# Patient Record
Sex: Female | Born: 1940 | Race: White | Hispanic: No | Marital: Married | Smoking: Never smoker
Health system: Southern US, Community
[De-identification: ages and names within clinical notes are randomized; demographics above are authoritative.]

## PROBLEM LIST (undated history)

## (undated) DIAGNOSIS — K219 Gastro-esophageal reflux disease without esophagitis: Secondary | ICD-10-CM

## (undated) DIAGNOSIS — A809 Acute poliomyelitis, unspecified: Secondary | ICD-10-CM

## (undated) DIAGNOSIS — E119 Type 2 diabetes mellitus without complications: Secondary | ICD-10-CM

## (undated) DIAGNOSIS — Z9889 Other specified postprocedural states: Secondary | ICD-10-CM

## (undated) DIAGNOSIS — F329 Major depressive disorder, single episode, unspecified: Secondary | ICD-10-CM

## (undated) DIAGNOSIS — C50919 Malignant neoplasm of unspecified site of unspecified female breast: Secondary | ICD-10-CM

## (undated) DIAGNOSIS — R112 Nausea with vomiting, unspecified: Secondary | ICD-10-CM

## (undated) DIAGNOSIS — Z87898 Personal history of other specified conditions: Secondary | ICD-10-CM

## (undated) DIAGNOSIS — E039 Hypothyroidism, unspecified: Secondary | ICD-10-CM

## (undated) DIAGNOSIS — F32A Depression, unspecified: Secondary | ICD-10-CM

## (undated) DIAGNOSIS — E7800 Pure hypercholesterolemia, unspecified: Secondary | ICD-10-CM

## (undated) DIAGNOSIS — I10 Essential (primary) hypertension: Secondary | ICD-10-CM

## (undated) HISTORY — DX: Major depressive disorder, single episode, unspecified: F32.9

## (undated) HISTORY — DX: Gastro-esophageal reflux disease without esophagitis: K21.9

## (undated) HISTORY — DX: Pure hypercholesterolemia, unspecified: E78.00

## (undated) HISTORY — DX: Hypothyroidism, unspecified: E03.9

## (undated) HISTORY — DX: Personal history of other specified conditions: Z87.898

## (undated) HISTORY — DX: Depression, unspecified: F32.A

## (undated) HISTORY — DX: Essential (primary) hypertension: I10

## (undated) HISTORY — PX: TUBAL LIGATION: SHX77

## (undated) HISTORY — DX: Acute poliomyelitis, unspecified: A80.9

## (undated) HISTORY — PX: EYE SURGERY: SHX253

## (undated) HISTORY — DX: Type 2 diabetes mellitus without complications: E11.9

## (undated) HISTORY — DX: Malignant neoplasm of unspecified site of unspecified female breast: C50.919

---
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
## 1999-09-04 ENCOUNTER — Ambulatory Visit (HOSPITAL_COMMUNITY): Admission: RE | Admit: 1999-09-04 | Discharge: 1999-09-04 | Payer: Self-pay | Admitting: Cardiology

## 2003-01-06 DIAGNOSIS — C50919 Malignant neoplasm of unspecified site of unspecified female breast: Secondary | ICD-10-CM

## 2003-01-06 HISTORY — DX: Malignant neoplasm of unspecified site of unspecified female breast: C50.919

## 2003-01-06 HISTORY — PX: BREAST BIOPSY: SHX20

## 2003-01-06 HISTORY — PX: BREAST LUMPECTOMY: SHX2

## 2003-05-25 ENCOUNTER — Other Ambulatory Visit: Payer: Self-pay

## 2003-09-04 ENCOUNTER — Other Ambulatory Visit: Payer: Self-pay

## 2003-09-05 ENCOUNTER — Other Ambulatory Visit: Payer: Self-pay

## 2003-10-06 ENCOUNTER — Encounter: Payer: Self-pay | Admitting: Oncology

## 2003-10-06 ENCOUNTER — Ambulatory Visit: Payer: Self-pay | Admitting: Radiation Oncology

## 2003-11-06 ENCOUNTER — Ambulatory Visit: Payer: Self-pay | Admitting: Radiation Oncology

## 2003-11-06 ENCOUNTER — Encounter: Payer: Self-pay | Admitting: Oncology

## 2003-12-06 ENCOUNTER — Ambulatory Visit: Payer: Self-pay | Admitting: Radiation Oncology

## 2003-12-06 ENCOUNTER — Encounter: Payer: Self-pay | Admitting: Oncology

## 2004-01-06 ENCOUNTER — Ambulatory Visit: Payer: Self-pay | Admitting: Radiation Oncology

## 2004-01-06 ENCOUNTER — Encounter: Payer: Self-pay | Admitting: Oncology

## 2004-02-06 ENCOUNTER — Ambulatory Visit: Payer: Self-pay | Admitting: Radiation Oncology

## 2004-02-06 ENCOUNTER — Encounter: Payer: Self-pay | Admitting: Oncology

## 2004-03-05 ENCOUNTER — Encounter: Payer: Self-pay | Admitting: Oncology

## 2004-03-05 ENCOUNTER — Ambulatory Visit: Payer: Self-pay | Admitting: Radiation Oncology

## 2004-04-05 ENCOUNTER — Encounter: Payer: Self-pay | Admitting: Oncology

## 2004-04-05 ENCOUNTER — Ambulatory Visit: Payer: Self-pay | Admitting: Radiation Oncology

## 2004-05-05 ENCOUNTER — Ambulatory Visit: Payer: Self-pay | Admitting: Radiation Oncology

## 2004-06-05 ENCOUNTER — Ambulatory Visit: Payer: Self-pay | Admitting: Radiation Oncology

## 2004-07-05 ENCOUNTER — Ambulatory Visit: Payer: Self-pay | Admitting: Radiation Oncology

## 2004-08-05 ENCOUNTER — Ambulatory Visit: Payer: Self-pay | Admitting: Radiation Oncology

## 2004-09-05 ENCOUNTER — Ambulatory Visit: Payer: Self-pay | Admitting: Radiation Oncology

## 2004-10-05 ENCOUNTER — Ambulatory Visit: Payer: Self-pay | Admitting: Radiation Oncology

## 2004-11-05 ENCOUNTER — Ambulatory Visit: Payer: Self-pay | Admitting: Radiation Oncology

## 2004-12-05 ENCOUNTER — Ambulatory Visit: Payer: Self-pay | Admitting: Oncology

## 2005-03-02 ENCOUNTER — Ambulatory Visit: Payer: Self-pay | Admitting: Oncology

## 2005-03-05 ENCOUNTER — Ambulatory Visit: Payer: Self-pay | Admitting: Oncology

## 2005-04-21 ENCOUNTER — Ambulatory Visit: Payer: Self-pay | Admitting: Oncology

## 2005-06-15 ENCOUNTER — Ambulatory Visit: Payer: Self-pay | Admitting: Oncology

## 2005-07-05 ENCOUNTER — Ambulatory Visit: Payer: Self-pay | Admitting: Oncology

## 2005-11-09 ENCOUNTER — Ambulatory Visit: Payer: Self-pay | Admitting: Oncology

## 2005-12-05 ENCOUNTER — Ambulatory Visit: Payer: Self-pay | Admitting: Oncology

## 2006-04-05 ENCOUNTER — Ambulatory Visit: Payer: Self-pay | Admitting: Oncology

## 2006-04-06 ENCOUNTER — Ambulatory Visit: Payer: Self-pay | Admitting: Oncology

## 2006-04-19 ENCOUNTER — Ambulatory Visit: Payer: Self-pay | Admitting: Oncology

## 2006-05-06 ENCOUNTER — Ambulatory Visit: Payer: Self-pay | Admitting: Oncology

## 2006-06-28 ENCOUNTER — Ambulatory Visit: Payer: Self-pay | Admitting: Oncology

## 2006-08-06 ENCOUNTER — Ambulatory Visit: Payer: Self-pay | Admitting: Oncology

## 2006-08-16 ENCOUNTER — Ambulatory Visit: Payer: Self-pay | Admitting: Oncology

## 2006-09-06 ENCOUNTER — Ambulatory Visit: Payer: Self-pay | Admitting: Oncology

## 2007-04-06 ENCOUNTER — Ambulatory Visit: Payer: Self-pay | Admitting: Oncology

## 2007-04-22 ENCOUNTER — Ambulatory Visit: Payer: Self-pay | Admitting: Oncology

## 2007-05-06 ENCOUNTER — Ambulatory Visit: Payer: Self-pay | Admitting: Oncology

## 2007-08-06 ENCOUNTER — Ambulatory Visit: Payer: Self-pay | Admitting: Oncology

## 2007-08-22 ENCOUNTER — Ambulatory Visit: Payer: Self-pay | Admitting: Oncology

## 2007-09-05 ENCOUNTER — Ambulatory Visit: Payer: Self-pay | Admitting: Oncology

## 2008-01-06 ENCOUNTER — Ambulatory Visit: Payer: Self-pay | Admitting: Nurse Practitioner

## 2008-01-23 ENCOUNTER — Ambulatory Visit: Payer: Self-pay | Admitting: Oncology

## 2008-02-06 ENCOUNTER — Ambulatory Visit: Payer: Self-pay | Admitting: Oncology

## 2008-02-06 ENCOUNTER — Ambulatory Visit: Payer: Self-pay | Admitting: Nurse Practitioner

## 2008-08-05 ENCOUNTER — Ambulatory Visit: Payer: Self-pay | Admitting: Oncology

## 2008-08-06 ENCOUNTER — Ambulatory Visit: Payer: Self-pay | Admitting: Oncology

## 2008-09-05 ENCOUNTER — Ambulatory Visit: Payer: Self-pay | Admitting: Oncology

## 2008-10-15 ENCOUNTER — Ambulatory Visit: Payer: Self-pay | Admitting: Oncology

## 2009-03-05 ENCOUNTER — Ambulatory Visit: Payer: Self-pay | Admitting: Oncology

## 2009-03-11 ENCOUNTER — Ambulatory Visit: Payer: Self-pay | Admitting: Oncology

## 2009-04-05 ENCOUNTER — Ambulatory Visit: Payer: Self-pay | Admitting: Oncology

## 2009-08-26 ENCOUNTER — Ambulatory Visit: Payer: Self-pay | Admitting: Oncology

## 2009-08-28 LAB — CANCER ANTIGEN 27.29: CA 27.29: 23.4 U/mL (ref 0.0–38.6)

## 2009-09-05 ENCOUNTER — Ambulatory Visit: Payer: Self-pay | Admitting: Oncology

## 2009-10-16 ENCOUNTER — Ambulatory Visit: Payer: Self-pay | Admitting: Oncology

## 2010-08-14 LAB — HM PAP SMEAR

## 2010-08-25 ENCOUNTER — Ambulatory Visit: Payer: Self-pay | Admitting: Oncology

## 2010-08-26 LAB — CANCER ANTIGEN 27.29: CA 27.29: 23.8 U/mL (ref 0.0–38.6)

## 2010-09-06 ENCOUNTER — Ambulatory Visit: Payer: Self-pay | Admitting: Oncology

## 2010-10-06 ENCOUNTER — Ambulatory Visit: Payer: Self-pay | Admitting: Oncology

## 2010-11-06 ENCOUNTER — Ambulatory Visit: Payer: Self-pay | Admitting: Oncology

## 2011-03-30 DIAGNOSIS — E119 Type 2 diabetes mellitus without complications: Secondary | ICD-10-CM | POA: Diagnosis not present

## 2011-03-30 DIAGNOSIS — E7800 Pure hypercholesterolemia, unspecified: Secondary | ICD-10-CM | POA: Diagnosis not present

## 2011-03-30 DIAGNOSIS — I10 Essential (primary) hypertension: Secondary | ICD-10-CM | POA: Diagnosis not present

## 2011-03-30 DIAGNOSIS — E039 Hypothyroidism, unspecified: Secondary | ICD-10-CM | POA: Diagnosis not present

## 2011-03-30 LAB — BASIC METABOLIC PANEL
BUN: 22 mg/dL — AB (ref 4–21)
Creatinine: 0.8 mg/dL (ref <=1.1)
Potassium: 4.5 mmol/L (ref 3.4–5.3)

## 2011-03-30 LAB — HEPATIC FUNCTION PANEL
ALT: 41 U/L — AB (ref 7–35)
AST: 51 U/L — AB (ref 13–35)

## 2011-03-30 LAB — LIPID PANEL
Cholesterol: 151 mg/dL (ref 0–200)
Triglycerides: 136 mg/dL (ref 40–160)

## 2011-09-24 ENCOUNTER — Ambulatory Visit: Payer: Self-pay | Admitting: Oncology

## 2011-11-16 ENCOUNTER — Telehealth: Payer: Self-pay | Admitting: Internal Medicine

## 2011-11-16 MED ORDER — PANTOPRAZOLE SODIUM 40 MG PO TBEC: 40.0000 mg | DELAYED_RELEASE_TABLET | Freq: Two times a day (BID) | ORAL | Status: DC

## 2011-11-16 MED ORDER — LORAZEPAM 1 MG PO TABS: 1.0000 mg | ORAL_TABLET | ORAL | Status: DC | PRN

## 2011-11-16 MED ORDER — METOPROLOL SUCCINATE ER 25 MG PO TB24: 25.0000 mg | ORAL_TABLET | Freq: Every day | ORAL | Status: DC

## 2011-11-16 NOTE — Telephone Encounter (Signed)
Pt son called wanting to know why his moms rx was not refill.  [facility] stated that pt had not made appointment appointment is in feb  losarpan 1mg  1 daily pantoprazole 40mg  twice daily Metoprolol er 25mg  twice daily Please advise when these are called in

## 2011-11-16 NOTE — Telephone Encounter (Signed)
Ok to refill lorazepam 1mg  q day prn #30 with one refill and protonix 40mg  q day #30 with 3 refills and toprol XL 25mg  bid #60 with 3 refills.

## 2011-11-16 NOTE — Telephone Encounter (Signed)
Sent in to pharmacy.  

## 2011-11-17 ENCOUNTER — Other Ambulatory Visit: Payer: Self-pay | Admitting: *Deleted

## 2011-11-17 NOTE — Telephone Encounter (Signed)
Called prescription in to pharmacy 

## 2011-11-18 NOTE — Telephone Encounter (Signed)
Pts rx called in.  See previous note

## 2011-12-17 ENCOUNTER — Telehealth: Payer: Self-pay | Admitting: Internal Medicine

## 2011-12-17 ENCOUNTER — Other Ambulatory Visit: Payer: Self-pay | Admitting: *Deleted

## 2011-12-17 NOTE — Telephone Encounter (Signed)
Patient's ativan called into pharmcist

## 2011-12-17 NOTE — Telephone Encounter (Signed)
Refill request for lorazepam 1 mg tab Sig: take one tablet by mouth at bedtime as needed Qty:30 Patient is scheduled for 02/17/12

## 2011-12-17 NOTE — Telephone Encounter (Signed)
Per message pts ativan was called in to pharmacy - 2 refills

## 2012-02-12 ENCOUNTER — Encounter: Payer: Self-pay | Admitting: *Deleted

## 2012-02-17 ENCOUNTER — Ambulatory Visit: Payer: Self-pay | Admitting: Internal Medicine

## 2012-02-18 ENCOUNTER — Telehealth: Payer: Self-pay | Admitting: Internal Medicine

## 2012-02-18 MED ORDER — METFORMIN HCL 500 MG PO TABS: 500.0000 mg | ORAL_TABLET | Freq: Two times a day (BID) | ORAL | Status: DC

## 2012-02-18 NOTE — Telephone Encounter (Signed)
Refilled metformin until her appt.

## 2012-02-23 ENCOUNTER — Encounter: Payer: Self-pay | Admitting: Internal Medicine

## 2012-03-16 ENCOUNTER — Other Ambulatory Visit: Payer: Self-pay | Admitting: Internal Medicine

## 2012-03-16 DIAGNOSIS — E119 Type 2 diabetes mellitus without complications: Secondary | ICD-10-CM

## 2012-03-16 DIAGNOSIS — E7800 Pure hypercholesterolemia, unspecified: Secondary | ICD-10-CM

## 2012-03-16 NOTE — Progress Notes (Signed)
Order placed for labs.

## 2012-03-17 ENCOUNTER — Telehealth: Payer: Self-pay | Admitting: *Deleted

## 2012-03-17 MED ORDER — LORAZEPAM 1 MG PO TABS: 1.0000 mg | ORAL_TABLET | ORAL | Status: DC | PRN

## 2012-03-17 MED ORDER — METFORMIN HCL 500 MG PO TABS: 500.0000 mg | ORAL_TABLET | Freq: Two times a day (BID) | ORAL | Status: DC

## 2012-03-17 MED ORDER — VENLAFAXINE HCL ER 150 MG PO CP24: 150.0000 mg | ORAL_CAPSULE | Freq: Every day | ORAL | Status: DC

## 2012-03-17 MED ORDER — LEVOTHYROXINE SODIUM 125 MCG PO TABS: 125.0000 ug | ORAL_TABLET | Freq: Every day | ORAL | Status: DC

## 2012-03-17 MED ORDER — ROSUVASTATIN CALCIUM 10 MG PO TABS: ORAL_TABLET | ORAL | Status: DC

## 2012-03-17 NOTE — Telephone Encounter (Signed)
Left message on patients home voice mail concerning lab appointment patient needs before her 04/11/12 office visit.

## 2012-03-21 NOTE — Telephone Encounter (Signed)
Called [facility] to check and see if rx's have been received. Pharmacy stated that rx's are ready for pick-up.

## 2012-03-22 ENCOUNTER — Encounter: Payer: Self-pay | Admitting: *Deleted

## 2012-03-31 ENCOUNTER — Other Ambulatory Visit (INDEPENDENT_AMBULATORY_CARE_PROVIDER_SITE_OTHER): Payer: Medicare Other

## 2012-03-31 DIAGNOSIS — E119 Type 2 diabetes mellitus without complications: Secondary | ICD-10-CM

## 2012-03-31 DIAGNOSIS — E7800 Pure hypercholesterolemia, unspecified: Secondary | ICD-10-CM | POA: Diagnosis not present

## 2012-03-31 DIAGNOSIS — E039 Hypothyroidism, unspecified: Secondary | ICD-10-CM

## 2012-03-31 LAB — HEPATIC FUNCTION PANEL: Total Bilirubin: 0.6 mg/dL (ref 0.3–1.2)

## 2012-03-31 LAB — CBC WITH DIFFERENTIAL/PLATELET
Basophils Absolute: 0 10*3/uL (ref 0.0–0.1)
Basophils Relative: 0.5 % (ref 0.0–3.0)
Eosinophils Absolute: 0.1 10*3/uL (ref 0.0–0.7)
Hemoglobin: 12.6 g/dL (ref 12.0–15.0)
Lymphocytes Relative: 30.6 % (ref 12.0–46.0)
MCHC: 34.2 g/dL (ref 30.0–36.0)
Monocytes Relative: 7.3 % (ref 3.0–12.0)
Neutro Abs: 3.4 10*3/uL (ref 1.4–7.7)
Neutrophils Relative %: 59.1 % (ref 43.0–77.0)
RBC: 3.77 Mil/uL — ABNORMAL LOW (ref 3.87–5.11)

## 2012-03-31 LAB — LIPID PANEL
HDL: 48.3 mg/dL (ref >39.00)
LDL Cholesterol: 52 mg/dL (ref 0–99)
Total CHOL/HDL Ratio: 3
VLDL: 29.2 mg/dL (ref 0.0–40.0)

## 2012-03-31 LAB — MICROALBUMIN / CREATININE URINE RATIO
Microalb Creat Ratio: 1.6 mg/g (ref 0.0–30.0)
Microalb, Ur: 3.6 mg/dL — ABNORMAL HIGH (ref 0.0–1.9)

## 2012-03-31 LAB — BASIC METABOLIC PANEL
Chloride: 101 mEq/L (ref 96–112)
GFR: 53.62 mL/min — ABNORMAL LOW (ref >60.00)
Potassium: 4.6 mEq/L (ref 3.5–5.1)
Sodium: 138 mEq/L (ref 135–145)

## 2012-03-31 LAB — HEMOGLOBIN A1C: Hgb A1c MFr Bld: 10.2 % — ABNORMAL HIGH (ref 4.6–6.5)

## 2012-04-11 ENCOUNTER — Ambulatory Visit (INDEPENDENT_AMBULATORY_CARE_PROVIDER_SITE_OTHER): Payer: Medicare Other | Admitting: Internal Medicine

## 2012-04-11 ENCOUNTER — Encounter: Payer: Self-pay | Admitting: Internal Medicine

## 2012-04-11 VITALS — BP 130/68 | HR 100 | Temp 98.4°F | Resp 18 | Ht 66.0 in | Wt 215.5 lb

## 2012-04-11 DIAGNOSIS — E7800 Pure hypercholesterolemia, unspecified: Secondary | ICD-10-CM | POA: Diagnosis not present

## 2012-04-11 DIAGNOSIS — E119 Type 2 diabetes mellitus without complications: Secondary | ICD-10-CM | POA: Diagnosis not present

## 2012-04-11 DIAGNOSIS — I10 Essential (primary) hypertension: Secondary | ICD-10-CM | POA: Diagnosis not present

## 2012-04-11 DIAGNOSIS — R0602 Shortness of breath: Secondary | ICD-10-CM

## 2012-04-11 DIAGNOSIS — K219 Gastro-esophageal reflux disease without esophagitis: Secondary | ICD-10-CM

## 2012-04-11 DIAGNOSIS — E039 Hypothyroidism, unspecified: Secondary | ICD-10-CM

## 2012-04-11 MED ORDER — GLIMEPIRIDE 2 MG PO TABS: 2.0000 mg | ORAL_TABLET | Freq: Every day | ORAL | Status: DC

## 2012-04-11 MED ORDER — ATORVASTATIN CALCIUM 10 MG PO TABS: 10.0000 mg | ORAL_TABLET | Freq: Every day | ORAL | Status: DC

## 2012-04-11 MED ORDER — METFORMIN HCL 1000 MG PO TABS: 1000.0000 mg | ORAL_TABLET | Freq: Two times a day (BID) | ORAL | Status: DC

## 2012-04-17 ENCOUNTER — Telehealth: Payer: Self-pay | Admitting: Internal Medicine

## 2012-04-17 MED ORDER — LORAZEPAM 1 MG PO TABS
1.0000 mg | ORAL_TABLET | Freq: Every day | ORAL | Status: DC | PRN
Start: 2012-04-17 — End: 2012-06-21

## 2012-04-17 NOTE — Telephone Encounter (Signed)
Called in rx for lorazepam 1mg  q day prn #30 with one refill.

## 2012-04-18 ENCOUNTER — Encounter: Payer: Self-pay | Admitting: Internal Medicine

## 2012-04-18 DIAGNOSIS — I10 Essential (primary) hypertension: Secondary | ICD-10-CM | POA: Insufficient documentation

## 2012-04-18 DIAGNOSIS — E1129 Type 2 diabetes mellitus with other diabetic kidney complication: Secondary | ICD-10-CM | POA: Insufficient documentation

## 2012-04-18 DIAGNOSIS — Z853 Personal history of malignant neoplasm of breast: Secondary | ICD-10-CM | POA: Insufficient documentation

## 2012-04-18 DIAGNOSIS — E119 Type 2 diabetes mellitus without complications: Secondary | ICD-10-CM | POA: Insufficient documentation

## 2012-04-18 DIAGNOSIS — K219 Gastro-esophageal reflux disease without esophagitis: Secondary | ICD-10-CM | POA: Insufficient documentation

## 2012-04-18 DIAGNOSIS — E7800 Pure hypercholesterolemia, unspecified: Secondary | ICD-10-CM | POA: Insufficient documentation

## 2012-04-18 DIAGNOSIS — E039 Hypothyroidism, unspecified: Secondary | ICD-10-CM | POA: Insufficient documentation

## 2012-04-18 NOTE — Progress Notes (Signed)
Subjective:    [name], female    DOB: 27-Apr-1940, 72 y.o.   MRN: [id number]  HPI 72 year old female with past history of diabetes, hypertension, hypercholesterolemia, hypothyroidism and breast cancer who comes in today for a scheduled follow up.  Here to transfer her care here to [facility].  I previously saw her at [place].  States she has been doing relatively well.  Noncompliant with appts.  Not checking sugars.  Not watching what she eats.  A1c just checked - 10.2.  On metformin.  Not exercising.  Plans to get more serious about her diet and exercise.  No sinus or allergy symptoms.  No chest pain.  She does report she gets sob  With exertion.  No acid reflux.  No nausea or vomiting.  No bowel change.  Still being followed at the [facility] for her breast.  Due to get a follow up mammogram at the end of the month.    Past Medical History  Diagnosis Date  . Hypercholesterolemia   . Hypothyroidism   . Hypertension   . Asthma     questionable  . Breast cancer     s/p chemotherapy and XRT  . Depression   . Hx of vertigo   . Diabetes   . Polio 1948    history  . GERD (gastroesophageal reflux disease)     Current Outpatient Prescriptions on File Prior to Visit  Medication Sig Dispense Refill  . levothyroxine (SYNTHROID, LEVOTHROID) 125 MCG tablet Take 1 tablet (125 mcg total) by mouth daily.  30 tablet  0  . metoprolol succinate (TOPROL-XL) 25 MG 24 hr tablet Take 1 tablet (25 mg total) by mouth daily.  60 tablet  3  . pantoprazole (PROTONIX) 40 MG tablet Take 1 tablet (40 mg total) by mouth 2 (two) times daily.  30 tablet  3  . valsartan-hydrochlorothiazide (DIOVAN-HCT) 160-12.5 MG per tablet Take 1 tablet by mouth daily.      Marland Kitchen venlafaxine XR (EFFEXOR-XR) 150 MG 24 hr capsule Take 1 capsule (150 mg total) by mouth daily.  30 capsule  0   No current facility-administered medications on file prior to visit.    Review of Systems Patient denies any headache,  lightheadedness or dizziness.  No sinus or allergy symptoms.  No chest pain, tightness or palpitations.  No cough or congestion.  She does report some increased sob with exertion.   No nausea or vomiting.  No abdominal pain or cramping.  No bowel change, such as diarrhea, constipation, BRBPR or melana.  No urine change.   Not checking sugars.  Not watching what she eats.  Feels she is handling stress relatively well.  Takes lorazepam prn.     Objective:   Physical Exam Filed Vitals:   04/11/12 1455  BP: 130/68  Pulse: 100  Temp: 98.4 F (36.9 C)  Resp: 62   72 year old female in no acute distress.   HEENT:  Nares- clear.  Oropharynx - without lesions. NECK:  Supple.  Nontender.  No audible bruit.  HEART:  Appears to be regular. LUNGS:  No crackles or wheezing audible.  Respirations even and unlabored.  RADIAL PULSE:  Equal bilaterally.   ABDOMEN:  Soft, nontender.  Bowel sounds present and normal.  No audible abdominal bruit.  EXTREMITIES:  No increased edema present.  DP pulses palpable and equal bilaterally.          Assessment & Plan:  INCREASED PSYCHOSOCIAL STRESSORS.  On  Effexor.  Takes lorazepam prn.  Follow.  Stable.   CARDIOVASCULAR.  No chest pain.  Does describe the sob with exertion.  EKG obtained today revealed SR with RBBB.  Discussed with her regarding further cardiac w/up.  She declines at this time.  Follow.  Will notify me or be reevaluated if symptoms change or worsen or if she changes her mind regarding further w/up.   PULMONARY.  Sob with exertion.  Follow.  Desires no further w/up currently.    HEALTH MAINTENANCE.  Declined physical today.  She is to have her mammogram at the end of the month.  Continue to follow up at the [facility].  Declines colonoscopy.

## 2012-04-18 NOTE — Assessment & Plan Note (Signed)
On protonix.  Follow.  

## 2012-04-18 NOTE — Assessment & Plan Note (Signed)
Recent fasting lipid profile (3/14) within normal limits.  Lipitor.  Follow.

## 2012-04-18 NOTE — Assessment & Plan Note (Signed)
Blood pressure has been under good control.  Same medication regimen.  Follow.  Follow metabolic panel.   

## 2012-04-18 NOTE — Assessment & Plan Note (Signed)
Mammogram scheduled to be done at the end of the month.  Continues to follow up at the [facility].

## 2012-04-18 NOTE — Assessment & Plan Note (Addendum)
Sugars poorly controlled.  A1c just checked - 10.2.  Brought in no recorded sugar readings.  On metformin 1000 bid.  Cost is an issue for her.  Will start amaryl 2mg  q day.  She plans to get more serious about her diet and exercise.  Also plans to start checking her sugars and recording.  Get her back in soon to reassess.  Will continue to adjust medication.

## 2012-04-18 NOTE — Assessment & Plan Note (Signed)
On thyroid replacement.  Follow tsh.  

## 2012-04-21 ENCOUNTER — Other Ambulatory Visit: Payer: Self-pay | Admitting: *Deleted

## 2012-04-21 MED ORDER — LEVOTHYROXINE SODIUM 125 MCG PO TABS: 125.0000 ug | ORAL_TABLET | Freq: Every day | ORAL | Status: DC

## 2012-04-21 MED ORDER — VENLAFAXINE HCL ER 150 MG PO CP24: 150.0000 mg | ORAL_CAPSULE | Freq: Every day | ORAL | Status: DC

## 2012-04-21 NOTE — Addendum Note (Signed)
Addended by: Baldomero Lamy on: 04/21/2012 01:42 PM   Modules accepted: Orders

## 2012-05-03 ENCOUNTER — Ambulatory Visit: Payer: PRIVATE HEALTH INSURANCE

## 2012-05-04 ENCOUNTER — Ambulatory Visit: Payer: Medicare Other | Admitting: Internal Medicine

## 2012-05-16 ENCOUNTER — Encounter: Payer: Self-pay | Admitting: Internal Medicine

## 2012-05-16 ENCOUNTER — Ambulatory Visit (INDEPENDENT_AMBULATORY_CARE_PROVIDER_SITE_OTHER): Payer: Medicare Other | Admitting: Internal Medicine

## 2012-05-16 VITALS — BP 110/68 | HR 88 | Temp 98.9°F | Ht 66.0 in | Wt 211.8 lb

## 2012-05-16 DIAGNOSIS — E7800 Pure hypercholesterolemia, unspecified: Secondary | ICD-10-CM | POA: Diagnosis not present

## 2012-05-16 DIAGNOSIS — E039 Hypothyroidism, unspecified: Secondary | ICD-10-CM | POA: Diagnosis not present

## 2012-05-16 DIAGNOSIS — E119 Type 2 diabetes mellitus without complications: Secondary | ICD-10-CM

## 2012-05-16 DIAGNOSIS — I10 Essential (primary) hypertension: Secondary | ICD-10-CM

## 2012-05-16 DIAGNOSIS — K219 Gastro-esophageal reflux disease without esophagitis: Secondary | ICD-10-CM

## 2012-05-16 MED ORDER — GLIMEPIRIDE 2 MG PO TABS: 2.0000 mg | ORAL_TABLET | Freq: Two times a day (BID) | ORAL | Status: DC

## 2012-05-16 NOTE — Progress Notes (Signed)
Subjective:    Patient ID: Hal Morales, female    DOB: 13-May-1940, 72 y.o.   MRN: [id number]  HPI 72 year old female with past history of diabetes, hypertension, hypercholesterolemia, hypothyroidism and breast cancer who comes in today for a scheduled follow up.  States she has been doing relatively well.   A1c just checked - 10.2.  On metformin and amaryl now.  Not exercising.  Plans to get more serious about her diet and exercise.  Is doing better now, trying to watch what she eats.  No sinus or allergy symptoms.  No chest pain.  Breathing stable.  No acid reflux.  No nausea or vomiting.  No bowel change.  Still being followed at the [facility] for her breast.  Sugars varying.  Better, but still elevated.  See attached list.     Past Medical History  Diagnosis Date  . Hypercholesterolemia   . Hypothyroidism   . Hypertension   . Asthma     questionable  . Breast cancer     s/p chemotherapy and XRT  . Depression   . Hx of vertigo   . Diabetes   . Polio 1948    history  . GERD (gastroesophageal reflux disease)     Current Outpatient Prescriptions on File Prior to Visit  Medication Sig Dispense Refill  . atorvastatin (LIPITOR) 10 MG tablet Take 1 tablet (10 mg total) by mouth daily.  30 tablet  1  . levothyroxine (SYNTHROID, LEVOTHROID) 125 MCG tablet Take 1 tablet (125 mcg total) by mouth daily.  30 tablet  6  . LORazepam (ATIVAN) 1 MG tablet Take 1 tablet (1 mg total) by mouth daily as needed.  30 tablet  1  . metFORMIN (GLUCOPHAGE) 1000 MG tablet Take 1 tablet (1,000 mg total) by mouth 2 (two) times daily with a meal.  60 tablet  1  . metoprolol succinate (TOPROL-XL) 25 MG 24 hr tablet Take 1 tablet (25 mg total) by mouth daily.  60 tablet  3  . pantoprazole (PROTONIX) 40 MG tablet Take 1 tablet (40 mg total) by mouth 2 (two) times daily.  30 tablet  3  . valsartan-hydrochlorothiazide (DIOVAN-HCT) 160-12.5 MG per tablet Take 1 tablet by mouth daily.      Marland Kitchen venlafaxine  XR (EFFEXOR-XR) 150 MG 24 hr capsule Take 1 capsule (150 mg total) by mouth daily.  30 capsule  3   No current facility-administered medications on file prior to visit.    Review of Systems Patient denies any headache, lightheadedness or dizziness.  No sinus or allergy symptoms.  No chest pain, tightness or palpitations.  No cough or congestion.  Breathing stable.  No nausea or vomiting.  No abdominal pain or cramping.  No bowel change, such as diarrhea, constipation, BRBPR or melana.  No urine change.  Feels she is handling stress relatively well.  Takes lorazepam prn.  Sugars as outlined.       Objective:   Physical Exam  Filed Vitals:   05/16/12 1138  BP: 110/68  Pulse: 88  Temp: 98.9 F (37.2 C)   Blood pressure recheck:  114/64, pulse 60  72 year old female in no acute distress.   HEENT:  Nares- clear.  Oropharynx - without lesions. NECK:  Supple.  Nontender.  No audible bruit.  HEART:  Appears to be regular. LUNGS:  No crackles or wheezing audible.  Respirations even and unlabored.  RADIAL PULSE:  Equal bilaterally.   ABDOMEN:  Soft, nontender.  Bowel sounds present and normal.  No audible abdominal bruit.  EXTREMITIES:  No increased edema present.  DP pulses palpable and equal bilaterally.          Assessment & Plan:  INCREASED PSYCHOSOCIAL STRESSORS.  On Effexor.  Takes lorazepam prn.  Follow.  Stable.   CARDIOVASCULAR.  No chest pain.  Stable.  Continue risk factor modification.   PULMONARY.  Breathing stable.     HEALTH MAINTENANCE.  Continue to follow up at the [facility] regarding her mammogram/brasts.    Declines colonoscopy.

## 2012-05-17 NOTE — Assessment & Plan Note (Signed)
Blood pressure has been under good control.  Same medication regimen.  Follow.  Follow metabolic panel.   

## 2012-05-17 NOTE — Assessment & Plan Note (Signed)
On thyroid replacement.  Follow tsh.  

## 2012-05-17 NOTE — Assessment & Plan Note (Signed)
Continues to follow up at the [facility].

## 2012-05-17 NOTE — Assessment & Plan Note (Signed)
On protonix.  Follow.  

## 2012-05-17 NOTE — Assessment & Plan Note (Signed)
Sugars previously poorly controlled.  A1c just checked - 10.2.  On metformin 1000 bid and amaryl 2mg  q day.  Will increase amaryl to 2mg  in the am and 1/2 of a 2mg  tablet in the pm.  Follow sugars.  Diet adjustment.  Follow for lows.  Send in sugar readings in the next few weeks.

## 2012-05-17 NOTE — Assessment & Plan Note (Signed)
Recent fasting lipid profile (3/14) within normal limits.  Lipitor.  Follow.   

## 2012-06-20 ENCOUNTER — Other Ambulatory Visit: Payer: Self-pay | Admitting: Internal Medicine

## 2012-06-21 ENCOUNTER — Other Ambulatory Visit: Payer: Self-pay | Admitting: *Deleted

## 2012-06-21 ENCOUNTER — Other Ambulatory Visit: Payer: Self-pay | Admitting: Internal Medicine

## 2012-06-21 MED ORDER — LORAZEPAM 1 MG PO TABS: 1.0000 mg | ORAL_TABLET | Freq: Every day | ORAL | Status: DC | PRN

## 2012-06-21 MED ORDER — ATORVASTATIN CALCIUM 10 MG PO TABS: ORAL_TABLET | ORAL | Status: DC

## 2012-06-21 MED ORDER — VALSARTAN-HYDROCHLOROTHIAZIDE 160-12.5 MG PO TABS: 1.0000 | ORAL_TABLET | Freq: Every day | ORAL | Status: DC

## 2012-07-18 ENCOUNTER — Other Ambulatory Visit: Payer: Self-pay | Admitting: Internal Medicine

## 2012-07-19 NOTE — Telephone Encounter (Signed)
Refill completed 06/21/12, Pt has refills available, confirmed by pharmacy

## 2012-07-21 ENCOUNTER — Other Ambulatory Visit: Payer: Self-pay | Admitting: Internal Medicine

## 2012-07-21 ENCOUNTER — Other Ambulatory Visit: Payer: Self-pay | Admitting: *Deleted

## 2012-07-21 MED ORDER — ATORVASTATIN CALCIUM 10 MG PO TABS: ORAL_TABLET | ORAL | Status: DC

## 2012-08-15 ENCOUNTER — Other Ambulatory Visit: Payer: Medicare Other

## 2012-08-16 ENCOUNTER — Other Ambulatory Visit: Payer: Self-pay | Admitting: Internal Medicine

## 2012-08-16 NOTE — Telephone Encounter (Signed)
Okay to refill? 

## 2012-08-18 ENCOUNTER — Other Ambulatory Visit: Payer: Self-pay | Admitting: *Deleted

## 2012-08-18 MED ORDER — VENLAFAXINE HCL ER 150 MG PO CP24: 150.0000 mg | ORAL_CAPSULE | Freq: Every day | ORAL | Status: DC

## 2012-08-22 ENCOUNTER — Ambulatory Visit: Payer: Medicare Other | Admitting: Internal Medicine

## 2012-08-24 ENCOUNTER — Encounter: Payer: Self-pay | Admitting: *Deleted

## 2012-09-13 ENCOUNTER — Other Ambulatory Visit: Payer: Self-pay | Admitting: *Deleted

## 2012-09-13 ENCOUNTER — Other Ambulatory Visit: Payer: Self-pay | Admitting: Internal Medicine

## 2012-09-13 MED ORDER — LORAZEPAM 1 MG PO TABS: 1.0000 mg | ORAL_TABLET | Freq: Every day | ORAL | Status: DC | PRN

## 2012-09-13 NOTE — Telephone Encounter (Signed)
I refilled her lorazepam x 1.  She did not show for her appt and no appt is schedule.  She needs to reschedule her appt with me.  I am not going to continue to refill.

## 2012-09-13 NOTE — Telephone Encounter (Signed)
Okay to refill? 

## 2012-09-14 ENCOUNTER — Encounter: Payer: Self-pay | Admitting: *Deleted

## 2012-09-14 NOTE — Telephone Encounter (Signed)
Left detailed message on voicemail that pt needs to come by office to pick up Rx & needs to schedule an appt with Dr. Lorin Picket for further refills

## 2012-09-23 ENCOUNTER — Telehealth: Payer: Self-pay | Admitting: *Deleted

## 2012-09-23 NOTE — Telephone Encounter (Signed)
Son came to pick up Rx for Ativan instead of the patient. We notified son that we will give it to him this time, however if she does not show at her scheduled appt in October, we will no longer refill that for her.

## 2012-09-26 ENCOUNTER — Ambulatory Visit: Payer: Self-pay | Admitting: Oncology

## 2012-09-26 DIAGNOSIS — F411 Generalized anxiety disorder: Secondary | ICD-10-CM | POA: Diagnosis not present

## 2012-09-26 DIAGNOSIS — Z170 Estrogen receptor positive status [ER+]: Secondary | ICD-10-CM | POA: Diagnosis not present

## 2012-09-26 DIAGNOSIS — Z79899 Other long term (current) drug therapy: Secondary | ICD-10-CM | POA: Diagnosis not present

## 2012-09-26 DIAGNOSIS — C50919 Malignant neoplasm of unspecified site of unspecified female breast: Secondary | ICD-10-CM | POA: Diagnosis not present

## 2012-09-26 DIAGNOSIS — I10 Essential (primary) hypertension: Secondary | ICD-10-CM | POA: Diagnosis not present

## 2012-09-26 DIAGNOSIS — Z1231 Encounter for screening mammogram for malignant neoplasm of breast: Secondary | ICD-10-CM | POA: Diagnosis not present

## 2012-09-26 DIAGNOSIS — E039 Hypothyroidism, unspecified: Secondary | ICD-10-CM | POA: Diagnosis not present

## 2012-09-26 DIAGNOSIS — Z79811 Long term (current) use of aromatase inhibitors: Secondary | ICD-10-CM | POA: Diagnosis not present

## 2012-09-26 DIAGNOSIS — Z23 Encounter for immunization: Secondary | ICD-10-CM | POA: Diagnosis not present

## 2012-09-27 DIAGNOSIS — Z23 Encounter for immunization: Secondary | ICD-10-CM | POA: Diagnosis not present

## 2012-09-27 DIAGNOSIS — Z170 Estrogen receptor positive status [ER+]: Secondary | ICD-10-CM | POA: Diagnosis not present

## 2012-09-27 DIAGNOSIS — C50919 Malignant neoplasm of unspecified site of unspecified female breast: Secondary | ICD-10-CM | POA: Diagnosis not present

## 2012-09-27 DIAGNOSIS — E039 Hypothyroidism, unspecified: Secondary | ICD-10-CM | POA: Diagnosis not present

## 2012-09-27 DIAGNOSIS — Z79811 Long term (current) use of aromatase inhibitors: Secondary | ICD-10-CM | POA: Diagnosis not present

## 2012-09-27 DIAGNOSIS — I10 Essential (primary) hypertension: Secondary | ICD-10-CM | POA: Diagnosis not present

## 2012-09-27 LAB — COMPREHENSIVE METABOLIC PANEL
Albumin: 4.4 g/dL (ref 3.4–5.0)
Alkaline Phosphatase: 78 U/L (ref 50–136)
Anion Gap: 5 — ABNORMAL LOW (ref 7–16)
BUN: 23 mg/dL — ABNORMAL HIGH (ref 7–18)
Bilirubin,Total: 0.5 mg/dL (ref 0.2–1.0)
Chloride: 106 mmol/L (ref 98–107)
Creatinine: 1.24 mg/dL (ref 0.60–1.30)
EGFR (African American): 50 — ABNORMAL LOW
Glucose: 60 mg/dL — ABNORMAL LOW (ref 65–99)
Osmolality: 281 (ref 275–301)
Potassium: 3.5 mmol/L (ref 3.5–5.1)
SGOT(AST): 40 U/L — ABNORMAL HIGH (ref 15–37)
SGPT (ALT): 33 U/L (ref 12–78)
Sodium: 140 mmol/L (ref 136–145)
Total Protein: 8.5 g/dL — ABNORMAL HIGH (ref 6.4–8.2)

## 2012-09-27 LAB — CBC CANCER CENTER
Basophil #: 0.1 x10 3/mm (ref 0.0–0.1)
Basophil %: 0.9 %
HCT: 37 % (ref 35.0–47.0)
Lymphocyte %: 34.2 %
MCHC: 34.7 g/dL (ref 32.0–36.0)
MCV: 99 fL (ref 80–100)
Monocyte #: 0.5 x10 3/mm (ref 0.2–0.9)
Monocyte %: 6.6 %
Neutrophil #: 4.3 x10 3/mm (ref 1.4–6.5)
Neutrophil %: 55.8 %
Platelet: 223 x10 3/mm (ref 150–440)
RBC: 3.72 10*6/uL — ABNORMAL LOW (ref 3.80–5.20)

## 2012-09-29 LAB — CANCER ANTIGEN 27.29: CA 27.29: 27.6 U/mL (ref 0.0–38.6)

## 2012-10-05 ENCOUNTER — Ambulatory Visit: Payer: Self-pay | Admitting: Oncology

## 2012-10-14 ENCOUNTER — Encounter: Payer: Self-pay | Admitting: *Deleted

## 2012-10-16 ENCOUNTER — Other Ambulatory Visit: Payer: Self-pay | Admitting: Internal Medicine

## 2012-10-17 NOTE — Telephone Encounter (Signed)
WILL REFILL ATIVAN DURING OFFICE VISIT TOMORROW

## 2012-10-18 ENCOUNTER — Encounter: Payer: Self-pay | Admitting: Internal Medicine

## 2012-10-18 ENCOUNTER — Ambulatory Visit (INDEPENDENT_AMBULATORY_CARE_PROVIDER_SITE_OTHER): Payer: Medicare Other | Admitting: Internal Medicine

## 2012-10-18 VITALS — BP 110/80 | HR 88 | Temp 99.0°F | Ht 66.0 in | Wt 213.5 lb

## 2012-10-18 DIAGNOSIS — I10 Essential (primary) hypertension: Secondary | ICD-10-CM | POA: Diagnosis not present

## 2012-10-18 DIAGNOSIS — E7800 Pure hypercholesterolemia, unspecified: Secondary | ICD-10-CM

## 2012-10-18 DIAGNOSIS — E039 Hypothyroidism, unspecified: Secondary | ICD-10-CM | POA: Diagnosis not present

## 2012-10-18 DIAGNOSIS — C50919 Malignant neoplasm of unspecified site of unspecified female breast: Secondary | ICD-10-CM

## 2012-10-18 DIAGNOSIS — K219 Gastro-esophageal reflux disease without esophagitis: Secondary | ICD-10-CM | POA: Diagnosis not present

## 2012-10-18 DIAGNOSIS — E119 Type 2 diabetes mellitus without complications: Secondary | ICD-10-CM

## 2012-10-18 LAB — HM COLONOSCOPY

## 2012-10-18 LAB — HM DIABETES FOOT EXAM

## 2012-10-18 NOTE — Progress Notes (Signed)
Subjective:    Patient ID: Hal Morales, female    DOB: 05-Jan-1941, 72 y.o.   MRN: [id number]  HPI 72 year old female with past history of diabetes, hypertension, hypercholesterolemia, hypothyroidism and breast cancer who comes in today for a scheduled follow up.  Has missed her last appts.   States she has been doing relatively well.   A1c last checked - 10.2.  On metformin and amaryl now.  Not exercising.  Plans to get more serious about her diet and exercise.  Is overdue labs and follow up.  Only brought in a few sugar readings.  Varying.  Overall most ranging 100-130.  No sinus or allergy symptoms.  No chest pain.  Breathing stable.  No acid reflux.  No nausea or vomiting.  No bowel change.  Still being followed at the [facility] for her breast.  States occasionally will have some spells where she feels weak.  Does not feel it is heart related.  States she has tried recently to start watching what she eats.    Past Medical History  Diagnosis Date  . Hypercholesterolemia   . Hypothyroidism   . Hypertension   . Asthma     questionable  . Breast cancer     s/p chemotherapy and XRT  . Depression   . Hx of vertigo   . Diabetes   . Polio 1948    history  . GERD (gastroesophageal reflux disease)     Current Outpatient Prescriptions on File Prior to Visit  Medication Sig Dispense Refill  . atorvastatin (LIPITOR) 10 MG tablet TAKE ONE TABLET BY MOUTH ONCE DAILY  30 tablet  5  . glimepiride (AMARYL) 2 MG tablet TAKE ONE TABLET BY MOUTH TWICE DAILY  60 tablet  5  . levothyroxine (SYNTHROID, LEVOTHROID) 125 MCG tablet Take 1 tablet (125 mcg total) by mouth daily.  30 tablet  6  . LORazepam (ATIVAN) 1 MG tablet Take 1 tablet (1 mg total) by mouth daily as needed for anxiety.  30 tablet  0  . metFORMIN (GLUCOPHAGE) 1000 MG tablet TAKE ONE TABLET BY MOUTH TWICE DAILY WITH A MEAL.  60 tablet  5  . metoprolol succinate (TOPROL-XL) 25 MG 24 hr tablet TAKE ONE TABLET BY MOUTH TWICE DAILY   60 tablet  3  . pantoprazole (PROTONIX) 40 MG tablet TAKE ONE TABLET BY MOUTH TWICE DAILY  60 tablet  5  . valsartan-hydrochlorothiazide (DIOVAN-HCT) 160-12.5 MG per tablet Take 1 tablet by mouth daily.  30 tablet  5  . venlafaxine XR (EFFEXOR-XR) 150 MG 24 hr capsule Take 1 capsule (150 mg total) by mouth daily.  30 capsule  3   No current facility-administered medications on file prior to visit.    Review of Systems Patient denies any headache, lightheadedness or dizziness.  No sinus or allergy symptoms.  No chest pain, tightness or palpitations.  No cough or congestion.  Breathing stable.  No nausea or vomiting.  No abdominal pain or cramping.  No bowel change, such as diarrhea, constipation, BRBPR or melana.  No urine change.  Feels she is handling stress relatively well.  Takes lorazepam prn.  Sugars as outlined.  Is overdue follow up.  Overdue labs.  Non compliant with appts.  Some weak spells as outlined.      Objective:   Physical Exam  Filed Vitals:   10/18/12 1411  BP: 110/80  Pulse: 88  Temp: 99 F (37.2 C)   Blood pressure recheck:  128/78, pulse  82  72 year old female in no acute distress.   HEENT:  Nares- clear.  Oropharynx - without lesions. NECK:  Supple.  Nontender.  No audible bruit.  HEART:  Appears to be regular. LUNGS:  No crackles or wheezing audible.  Respirations even and unlabored.  RADIAL PULSE:  Equal bilaterally.   ABDOMEN:  Soft, nontender.  Bowel sounds present and normal.  No audible abdominal bruit.  EXTREMITIES:  No increased edema present.  DP pulses palpable and equal bilaterally. FEET:  Without lesions.           Assessment & Plan:  INCREASED PSYCHOSOCIAL STRESSORS.  On Effexor.  Takes lorazepam prn.  Follow.  Stable.  "WEAK SPELLS".  Unclear as to the exact etiology.  States she has checked her sugar when it occurs and it is not high or too low.  Has not checked her blood pressure.  Stay hydrated. Eat regular meals.  Check labs.  Discussed  further w/up.  She declines.    CARDIOVASCULAR.  No chest pain.  Stable.  Continue risk factor modification.   PULMONARY.  Breathing stable.     HEALTH MAINTENANCE.  Continue to follow up at the [facility] regarding her mammogram/brasts.    Declines colonoscopy.

## 2012-10-19 ENCOUNTER — Other Ambulatory Visit: Payer: Self-pay | Admitting: Internal Medicine

## 2012-10-20 DIAGNOSIS — Z79899 Other long term (current) drug therapy: Secondary | ICD-10-CM | POA: Diagnosis not present

## 2012-10-23 ENCOUNTER — Encounter: Payer: Self-pay | Admitting: Internal Medicine

## 2012-10-23 NOTE — Assessment & Plan Note (Signed)
On thyroid replacement.  Follow tsh.  

## 2012-10-23 NOTE — Assessment & Plan Note (Signed)
On protonix.  Follow.  

## 2012-10-23 NOTE — Assessment & Plan Note (Signed)
Recent fasting lipid profile (3/14) within normal limits.  Lipitor.  Follow.  Due labs.  Schedule lipid profile and liver panel - to be checked.

## 2012-10-23 NOTE — Assessment & Plan Note (Signed)
Sugars previously poorly controlled.  A1c just checked - 10.2.  On metformin 1000 bid and amaryl 2mg  q day.  On Amaryl  2mg  in the am and 1/2 of a 2mg  tablet in the pm.  Per her few readings she brought, sugars appear to be better.  Hold on making adjustments in her medications.  Stressed to her the importance of regular follow up appts and checking sugars regularly.  Also stressed importance of diet adjustment and exercise.   Follow sugars,   Follow for lows.  Send in sugar readings in the next few weeks.  Schedule labs and follow up appt.  Needs eye check.

## 2012-10-23 NOTE — Assessment & Plan Note (Signed)
[facility]   States she is up to date.    

## 2012-10-23 NOTE — Assessment & Plan Note (Signed)
Blood pressure has been under good control.  Same medication regimen.  Follow.  Follow metabolic panel.   

## 2012-10-25 ENCOUNTER — Other Ambulatory Visit: Payer: Medicare Other

## 2012-11-01 ENCOUNTER — Other Ambulatory Visit (INDEPENDENT_AMBULATORY_CARE_PROVIDER_SITE_OTHER): Payer: Medicare Other

## 2012-11-01 DIAGNOSIS — I10 Essential (primary) hypertension: Secondary | ICD-10-CM

## 2012-11-01 DIAGNOSIS — E039 Hypothyroidism, unspecified: Secondary | ICD-10-CM

## 2012-11-01 DIAGNOSIS — E119 Type 2 diabetes mellitus without complications: Secondary | ICD-10-CM

## 2012-11-01 DIAGNOSIS — E7800 Pure hypercholesterolemia, unspecified: Secondary | ICD-10-CM | POA: Diagnosis not present

## 2012-11-01 LAB — HEPATIC FUNCTION PANEL
ALT: 25 U/L (ref 0–35)
AST: 32 U/L (ref 0–37)
Albumin: 4.4 g/dL (ref 3.5–5.2)
Alkaline Phosphatase: 56 U/L (ref 39–117)
Bilirubin, Direct: 0.1 mg/dL (ref 0.0–0.3)
Total Bilirubin: 0.7 mg/dL (ref 0.3–1.2)
Total Protein: 7.9 g/dL (ref 6.0–8.3)

## 2012-11-01 LAB — HEMOGLOBIN A1C: Hgb A1c MFr Bld: 6.9 % — ABNORMAL HIGH (ref 4.6–6.5)

## 2012-11-01 LAB — TSH: TSH: 16.57 u[IU]/mL — ABNORMAL HIGH (ref 0.35–5.50)

## 2012-11-01 LAB — BASIC METABOLIC PANEL
CO2: 23 mEq/L (ref 19–32)
Calcium: 10 mg/dL (ref 8.4–10.5)
Chloride: 106 mEq/L (ref 96–112)
GFR: 39.58 mL/min — ABNORMAL LOW (ref >60.00)
Sodium: 141 mEq/L (ref 135–145)

## 2012-11-01 LAB — LIPID PANEL
Cholesterol: 190 mg/dL (ref 0–200)
Total CHOL/HDL Ratio: 4
Triglycerides: 219 mg/dL — ABNORMAL HIGH (ref 0.0–149.0)

## 2012-11-01 LAB — LDL CHOLESTEROL, DIRECT: Direct LDL: 109.9 mg/dL

## 2012-11-02 ENCOUNTER — Other Ambulatory Visit: Payer: Self-pay | Admitting: *Deleted

## 2012-11-02 ENCOUNTER — Other Ambulatory Visit: Payer: Self-pay | Admitting: Internal Medicine

## 2012-11-02 DIAGNOSIS — N289 Disorder of kidney and ureter, unspecified: Secondary | ICD-10-CM

## 2012-11-02 MED ORDER — VALSARTAN 160 MG PO TABS: 160.0000 mg | ORAL_TABLET | Freq: Every day | ORAL | Status: DC

## 2012-11-02 NOTE — Progress Notes (Signed)
Orders placed for f/u labs.  

## 2012-11-03 ENCOUNTER — Ambulatory Visit: Payer: Medicare Other | Admitting: Internal Medicine

## 2012-11-04 ENCOUNTER — Other Ambulatory Visit (INDEPENDENT_AMBULATORY_CARE_PROVIDER_SITE_OTHER): Payer: Medicare Other

## 2012-11-04 DIAGNOSIS — N289 Disorder of kidney and ureter, unspecified: Secondary | ICD-10-CM | POA: Diagnosis not present

## 2012-11-04 LAB — URINALYSIS, ROUTINE W REFLEX MICROSCOPIC
Bilirubin Urine: NEGATIVE
Ketones, ur: NEGATIVE
Nitrite: NEGATIVE
RBC / HPF: NONE SEEN (ref 0–?)
Specific Gravity, Urine: >=1.03 (ref 1.000–1.030)
Urobilinogen, UA: 0.2 (ref 0.0–1.0)
pH: 5.5 (ref 5.0–8.0)

## 2012-11-04 LAB — BASIC METABOLIC PANEL
BUN: 37 mg/dL — ABNORMAL HIGH (ref 6–23)
Calcium: 10.1 mg/dL (ref 8.4–10.5)
GFR: 42.38 mL/min — ABNORMAL LOW (ref >60.00)
Potassium: 4.9 mEq/L (ref 3.5–5.1)
Sodium: 139 mEq/L (ref 135–145)

## 2012-11-06 ENCOUNTER — Other Ambulatory Visit: Payer: Self-pay | Admitting: Internal Medicine

## 2012-11-06 DIAGNOSIS — N289 Disorder of kidney and ureter, unspecified: Secondary | ICD-10-CM

## 2012-11-06 DIAGNOSIS — R8281 Pyuria: Secondary | ICD-10-CM

## 2012-11-06 NOTE — Progress Notes (Signed)
Orders placed for f/u labs.  

## 2012-11-08 ENCOUNTER — Encounter: Payer: Self-pay | Admitting: Internal Medicine

## 2012-11-22 ENCOUNTER — Ambulatory Visit: Payer: Medicare Other | Admitting: Internal Medicine

## 2012-11-25 ENCOUNTER — Other Ambulatory Visit: Payer: Medicare Other

## 2012-12-17 ENCOUNTER — Other Ambulatory Visit: Payer: Self-pay | Admitting: Internal Medicine

## 2012-12-19 NOTE — Telephone Encounter (Signed)
She did not keep her last appt with me.  I see she has an appt scheduled in 2/15.  We made some changes after last lab.  Was supposed to f/u with me regarding these changes.  Needs an appt within the month (end of 1/2 day or ).  Can refill medication x 1 only.  If she does not keep the appt - I will not be able to continue to prescribe.

## 2012-12-26 ENCOUNTER — Other Ambulatory Visit: Payer: Self-pay | Admitting: *Deleted

## 2012-12-26 ENCOUNTER — Telehealth: Payer: Self-pay | Admitting: Internal Medicine

## 2012-12-26 MED ORDER — LORAZEPAM 1 MG PO TABS: ORAL_TABLET | ORAL | Status: DC

## 2012-12-26 NOTE — Telephone Encounter (Signed)
Rx faxed to pharmacy & son notified

## 2012-12-26 NOTE — Telephone Encounter (Signed)
Rx printed

## 2012-12-26 NOTE — Telephone Encounter (Signed)
Renae Fickle states pt needs venlafaxine and lorazepam refills are needed.  States pharmacist told him venlafaxine had been called in but not lorazepam.

## 2012-12-26 NOTE — Telephone Encounter (Signed)
When?

## 2012-12-27 ENCOUNTER — Other Ambulatory Visit: Payer: Self-pay | Admitting: *Deleted

## 2012-12-27 MED ORDER — VENLAFAXINE HCL ER 150 MG PO CP24: 150.0000 mg | ORAL_CAPSULE | Freq: Every day | ORAL | Status: DC

## 2012-12-27 NOTE — Telephone Encounter (Signed)
Pt has appt on 01/16/13-sent 30 day on both medications until seen

## 2013-01-16 ENCOUNTER — Ambulatory Visit (INDEPENDENT_AMBULATORY_CARE_PROVIDER_SITE_OTHER)
Admission: RE | Admit: 2013-01-16 | Discharge: 2013-01-16 | Disposition: A | Discharge status: Home / Self Care | Payer: Medicare Other | Source: Ambulatory Visit | Attending: Internal Medicine | Admitting: Internal Medicine

## 2013-01-16 ENCOUNTER — Ambulatory Visit (INDEPENDENT_AMBULATORY_CARE_PROVIDER_SITE_OTHER): Payer: Medicare Other | Admitting: Internal Medicine

## 2013-01-16 ENCOUNTER — Encounter: Payer: Self-pay | Admitting: Internal Medicine

## 2013-01-16 ENCOUNTER — Telehealth: Payer: Self-pay | Admitting: Internal Medicine

## 2013-01-16 VITALS — BP 130/80 | HR 74 | Temp 98.5°F | Ht 66.0 in | Wt 208.5 lb

## 2013-01-16 DIAGNOSIS — M25569 Pain in unspecified knee: Secondary | ICD-10-CM

## 2013-01-16 DIAGNOSIS — E119 Type 2 diabetes mellitus without complications: Secondary | ICD-10-CM

## 2013-01-16 DIAGNOSIS — E7800 Pure hypercholesterolemia, unspecified: Secondary | ICD-10-CM

## 2013-01-16 DIAGNOSIS — M5137 Other intervertebral disc degeneration, lumbosacral region: Secondary | ICD-10-CM | POA: Diagnosis not present

## 2013-01-16 DIAGNOSIS — E039 Hypothyroidism, unspecified: Secondary | ICD-10-CM

## 2013-01-16 DIAGNOSIS — M79606 Pain in leg, unspecified: Secondary | ICD-10-CM

## 2013-01-16 DIAGNOSIS — M25562 Pain in left knee: Secondary | ICD-10-CM

## 2013-01-16 DIAGNOSIS — M549 Dorsalgia, unspecified: Secondary | ICD-10-CM | POA: Diagnosis not present

## 2013-01-16 DIAGNOSIS — M79609 Pain in unspecified limb: Secondary | ICD-10-CM

## 2013-01-16 DIAGNOSIS — K219 Gastro-esophageal reflux disease without esophagitis: Secondary | ICD-10-CM

## 2013-01-16 DIAGNOSIS — C50919 Malignant neoplasm of unspecified site of unspecified female breast: Secondary | ICD-10-CM

## 2013-01-16 DIAGNOSIS — I10 Essential (primary) hypertension: Secondary | ICD-10-CM

## 2013-01-16 DIAGNOSIS — M47817 Spondylosis without myelopathy or radiculopathy, lumbosacral region: Secondary | ICD-10-CM | POA: Diagnosis not present

## 2013-01-16 DIAGNOSIS — N289 Disorder of kidney and ureter, unspecified: Secondary | ICD-10-CM

## 2013-01-16 DIAGNOSIS — IMO0002 Reserved for concepts with insufficient information to code with codable children: Secondary | ICD-10-CM | POA: Diagnosis not present

## 2013-01-16 DIAGNOSIS — R82998 Other abnormal findings in urine: Secondary | ICD-10-CM | POA: Diagnosis not present

## 2013-01-16 DIAGNOSIS — R8281 Pyuria: Secondary | ICD-10-CM

## 2013-01-16 LAB — BASIC METABOLIC PANEL
BUN: 17 mg/dL (ref 6–23)
CALCIUM: 9.6 mg/dL (ref 8.4–10.5)
CO2: 25 meq/L (ref 19–32)
Chloride: 107 mEq/L (ref 96–112)
Creatinine, Ser: 1.1 mg/dL (ref 0.4–1.2)
GFR: 52.93 mL/min — ABNORMAL LOW (ref >60.00)
GLUCOSE: 73 mg/dL (ref 70–99)
Potassium: 4.4 mEq/L (ref 3.5–5.1)
SODIUM: 140 meq/L (ref 135–145)

## 2013-01-16 LAB — URINALYSIS, ROUTINE W REFLEX MICROSCOPIC
Bilirubin Urine: NEGATIVE
Ketones, ur: NEGATIVE
Nitrite: NEGATIVE
Specific Gravity, Urine: >=1.03 — AB (ref 1.000–1.030)
Total Protein, Urine: NEGATIVE
URINE GLUCOSE: NEGATIVE
UROBILINOGEN UA: 0.2 (ref 0.0–1.0)
pH: 5.5 (ref 5.0–8.0)

## 2013-01-16 LAB — HEPATIC FUNCTION PANEL
ALT: 19 U/L (ref 0–35)
AST: 19 U/L (ref 0–37)
Albumin: 4 g/dL (ref 3.5–5.2)
Alkaline Phosphatase: 81 U/L (ref 39–117)
BILIRUBIN TOTAL: 0.7 mg/dL (ref 0.3–1.2)
Bilirubin, Direct: 0.1 mg/dL (ref 0.0–0.3)
Total Protein: 7.5 g/dL (ref 6.0–8.3)

## 2013-01-16 LAB — TSH: TSH: 3.2 u[IU]/mL (ref 0.35–5.50)

## 2013-01-16 LAB — HEMOGLOBIN A1C: Hgb A1c MFr Bld: 6.4 % (ref 4.6–6.5)

## 2013-01-16 MED ORDER — TRAMADOL HCL 50 MG PO TABS: 50.0000 mg | ORAL_TABLET | Freq: Two times a day (BID) | ORAL | Status: DC | PRN

## 2013-01-16 NOTE — Progress Notes (Signed)
Pre-visit discussion using our clinic review tool. No additional management support is needed unless otherwise documented below in the visit note.  

## 2013-01-16 NOTE — Telephone Encounter (Signed)
noted 

## 2013-01-16 NOTE — Telephone Encounter (Signed)
Patient Information:  Caller Name: Asra  Phone: [phone]  Patient: Joyce Maynard, Joyce Maynard  Gender: Female  DOB: 1940/04/29  Age: 73 Years  PCP: Einar Pheasant  Office Follow Up:  Does the office need to follow up with this patient?: No  Instructions For The Office: N/A  RN Note:  Pt will come to office today as scheduled  Symptoms  Reason For Call & Symptoms: Pt states that she has a routine appt for today but she injuried her back and leg approx 2 weeks ago and wants MD to look at left leg and back when she comes in today for her routine appt; appt is 1:30pm today; pt just felt better calling to let office know that she wanted leg looked at too before she came for her appt  Reviewed Health History In EMR: N/A  Reviewed Medications In EMR: N/A  Reviewed Allergies In EMR: N/A  Reviewed Surgeries / Procedures: N/A  Date of Onset of Symptoms: 01/02/2013  Guideline(s) Used:  No Protocol Available - Information Only  Disposition Per Guideline:   Home Care  Reason For Disposition Reached:   Information only question and nurse able to answer  Advice Given:  Call Back If:  You become worse.  Patient Will Follow Care Advice:  YES

## 2013-01-17 LAB — CULTURE, URINE COMPREHENSIVE
Colony Count: NO GROWTH
Organism ID, Bacteria: NO GROWTH

## 2013-01-18 ENCOUNTER — Encounter: Payer: Self-pay | Admitting: Internal Medicine

## 2013-01-18 DIAGNOSIS — M79606 Pain in leg, unspecified: Secondary | ICD-10-CM | POA: Insufficient documentation

## 2013-01-18 NOTE — Assessment & Plan Note (Signed)
Continues to follow up at the [facility].   States she is up to date.

## 2013-01-18 NOTE — Assessment & Plan Note (Signed)
On protonix.  Follow.  

## 2013-01-18 NOTE — Assessment & Plan Note (Signed)
Blood pressure has been under good control.  Same medication regimen.  Follow.  Follow metabolic panel.   

## 2013-01-18 NOTE — Progress Notes (Signed)
Subjective:    [name], female    DOB: [birth year], 73 y.o.   MRN: [id number]  Back Pain Associated symptoms include leg pain.  Leg Pain   73 year old female with past history of diabetes, hypertension, hypercholesterolemia, hypothyroidism and breast cancer who comes in today for a scheduled follow up.  Has missed her last appts.   States she has been doing relatively well.    On metformin and amaryl now.  States sugars averaging 100-145 and pm sugars averaging 150.  Not exercising.  Plans to get more serious about her diet and exercise.  Is overdue labs and follow up.   No sinus or allergy symptoms.  No chest pain.  Breathing stable.  No acid reflux.  No nausea or vomiting.  No bowel change.  Still being followed at the [facility] for her breast.  She does report that she leaned over the arm of a chair and injured her left anterior ribs.  This occurred several weeks ago.  Is better now.  No pain with deep breathing.  She also reports pain in the left popliteal region. Flares intermittently.  She also fell in the door and injured her left lower leg.  Increased bruising and swelling since the injury.  She also injured her back moving a mattress.  This occurred two weeks ago.  Has been taking advil.     Past Medical History  Diagnosis Date  . Hypercholesterolemia   . Hypothyroidism   . Hypertension   . Asthma     questionable  . Breast cancer     s/p chemotherapy and XRT  . Depression   . Hx of vertigo   . Diabetes   . Polio 1948    history  . GERD (gastroesophageal reflux disease)     Current Outpatient Prescriptions on File Prior to Visit  Medication Sig Dispense Refill  . atorvastatin (LIPITOR) 10 MG tablet TAKE ONE TABLET BY MOUTH ONCE DAILY  30 tablet  5  . glimepiride (AMARYL) 2 MG tablet TAKE ONE TABLET BY MOUTH TWICE DAILY  60 tablet  5  . levothyroxine (SYNTHROID, LEVOTHROID) 125 MCG tablet Take 1 tablet (125 mcg total) by mouth daily.  30 tablet  6   . LORazepam (ATIVAN) 1 MG tablet TAKE ONE TABLET BY MOUTH ONCE DAILY AS NEEDED FOR ANXIETY-MUST KEEP APPT ON 01/16/13 FOR FURTHER REFILLS  30 tablet  0  . metFORMIN (GLUCOPHAGE) 1000 MG tablet TAKE ONE TABLET BY MOUTH TWICE DAILY WITH A MEAL.  60 tablet  5  . metoprolol succinate (TOPROL-XL) 25 MG 24 hr tablet TAKE ONE TABLET BY MOUTH TWICE DAILY  60 tablet  3  . pantoprazole (PROTONIX) 40 MG tablet TAKE ONE TABLET BY MOUTH TWICE DAILY  60 tablet  5  . valsartan (DIOVAN) 160 MG tablet Take 1 tablet (160 mg total) by mouth daily.  30 tablet  5  . venlafaxine XR (EFFEXOR-XR) 150 MG 24 hr capsule Take 1 capsule (150 mg total) by mouth daily.  30 capsule  0   No current facility-administered medications on file prior to visit.    Review of Systems  Musculoskeletal: Positive for back pain.  Patient denies any headache, lightheadedness or dizziness.  No sinus or allergy symptoms.  No chest pain, tightness or palpitations.  No cough or congestion.  Breathing stable.  No nausea or vomiting.  No abdominal pain or cramping.  No bowel change, such as diarrhea, constipation, BRBPR or melana.  No  urine change.  Feels she is handling stress relatively well.  Takes lorazepam prn.  Sugars as outlined.  Is overdue follow up labs.   Non compliant with appts.  Rib pain better.  Left popliteal pain.  Flares intermittently.  Left lower leg pain and bruising.  Back pain as outlined.       Objective:   Physical Exam  Filed Vitals:   01/16/13 1344  BP: 130/80  Pulse: 74  Temp: 98.5 F (36.9 C)   Blood pressure recheck:  35/29  73 year old female in no acute distress.   HEENT:  Nares- clear.  Oropharynx - without lesions. NECK:  Supple.  Nontender.  No audible bruit.  HEART:  Appears to be regular. LUNGS:  No crackles or wheezing audible.  Respirations even and unlabored.  RADIAL PULSE:  Equal bilaterally.   ABDOMEN:  Soft, nontender.  Bowel sounds present and normal.  No audible abdominal bruit.   EXTREMITIES:  No increased edema present.  DP pulses palpable and equal bilaterally.  Increased bruising and soft tissue swelling left lower anterior leg - just below the knee.  No increased erythema or warmth.  Minimal tenderness to palpation - medial knee.   FEET:  Without lesions.  MSK:  No pain with straight leg raise.  No pain with abduction and adduction.            Assessment & Plan:  INCREASED PSYCHOSOCIAL STRESSORS.  On Effexor.  Takes lorazepam prn.  Follow.  Stable.  CARDIOVASCULAR.  No chest pain.  Stable.  Continue risk factor modification.   PULMONARY.  Breathing stable.     HEALTH MAINTENANCE.  Schedule a physical.  Continue to follow up at the [facility] regarding her mammogram/brasts.    Declines colonoscopy.

## 2013-01-18 NOTE — Assessment & Plan Note (Signed)
Increased pain and swelling - left anterior leg.  S/p injury.  Improved some.  Should reabsorb.  Follow.  She also has left hip and leg pain.  Ultram as directed.  Will xray back, hip and knee.  Discussed further w/up.  Will await xray results.

## 2013-01-18 NOTE — Assessment & Plan Note (Signed)
On metformin 1000 bid and amaryl 2mg  q day.  On Amaryl  2mg  in the am and 1/2 of a 2mg  tablet in the pm.   Low carb diet and exercise.  Check metabolic panel and W8G.  Needs eye check.

## 2013-01-18 NOTE — Assessment & Plan Note (Signed)
Recent fasting lipid profile (3/14) within normal limits.  Lipitor.  Follow.  Due labs.  Schedule lipid profile and liver panel.

## 2013-01-18 NOTE — Assessment & Plan Note (Signed)
On thyroid replacement.  Follow tsh.  

## 2013-01-23 ENCOUNTER — Other Ambulatory Visit: Payer: Self-pay | Admitting: Internal Medicine

## 2013-01-23 NOTE — Telephone Encounter (Signed)
Ok refill? 

## 2013-01-23 NOTE — Telephone Encounter (Signed)
Refilled lorazepam #30 with one refill.   

## 2013-01-24 NOTE — Telephone Encounter (Signed)
Rx faxed to pharmacy  

## 2013-02-03 ENCOUNTER — Telehealth: Payer: Self-pay | Admitting: Internal Medicine

## 2013-02-03 NOTE — Telephone Encounter (Signed)
Pt left voice mail message she had ? about her appointment  1/30 Left message for pt to call office

## 2013-02-06 ENCOUNTER — Ambulatory Visit: Payer: Medicare Other | Admitting: Internal Medicine

## 2013-02-06 ENCOUNTER — Other Ambulatory Visit: Payer: Medicare Other

## 2013-02-10 ENCOUNTER — Ambulatory Visit: Payer: Medicare Other | Admitting: Internal Medicine

## 2013-02-11 ENCOUNTER — Telehealth: Payer: Self-pay | Admitting: *Deleted

## 2013-02-11 NOTE — Telephone Encounter (Signed)
I have checked online to find an alternative to the Diovan 160 mg  that is no longer covered by [facility]. I found Losartan Potassium, Irbesartan, & Valsartan (any strengths) are the recommended substitutes.

## 2013-02-13 NOTE — Telephone Encounter (Signed)
Please notify pt that her insurance is requiring her to change her diovan.  She had an appt with me on 02/10/13 - when I had to change.  I can see her on 02/17/13 at 2:00.  See if she has enough of her medication to make it to that appt and we will discuss then.

## 2013-02-15 NOTE — Telephone Encounter (Signed)
Left detailed message on voicemail & asked for a call back to confirm appt.

## 2013-02-17 ENCOUNTER — Ambulatory Visit: Payer: Medicare Other | Admitting: Internal Medicine

## 2013-02-27 ENCOUNTER — Telehealth: Payer: Self-pay | Admitting: Internal Medicine

## 2013-02-27 ENCOUNTER — Other Ambulatory Visit: Payer: Self-pay | Admitting: *Deleted

## 2013-02-27 MED ORDER — LOSARTAN POTASSIUM 100 MG PO TABS: 100.0000 mg | ORAL_TABLET | Freq: Every day | ORAL | Status: DC

## 2013-02-27 NOTE — Telephone Encounter (Signed)
The patient want to know if it is ok for her to switch to the generic form of her Diovan. Her insurance listing for this medication has changed.

## 2013-02-27 NOTE — Telephone Encounter (Signed)
I think this is the one you checked and the covered medications were Losartan, etc.  I don't think that actual Diovan has a generic.  There was a form letter regarding this medication.  If there is a generic for diovan, I am ok for her to switch.  I don't think there is.  I think we will have to put her on one of the other generic medications is the same family.  Has she ever had problems with any other blood pressure medication.  If no, then can try Losartan 100mg  q day (if no generic in Diovan).  Thanks.

## 2013-02-27 NOTE — Telephone Encounter (Signed)
Unable to reach pt by phone-sent in Losartan 100mg  daily in place of the Diovan. Pt no showed on 2/13. Next appt on 4/17.

## 2013-02-28 NOTE — Telephone Encounter (Signed)
Left detailed voicemail & reminded them to check & monitor BP and to keep scheduled appt on 4/17.

## 2013-02-28 NOTE — Telephone Encounter (Signed)
Please send her a message or call her son Samari Bittinger and leave a message and let them know changing medication and to monitor blood pressure and let us know if any problems.

## 2013-03-03 ENCOUNTER — Other Ambulatory Visit: Payer: Self-pay | Admitting: *Deleted

## 2013-03-03 MED ORDER — LEVOTHYROXINE SODIUM 125 MCG PO TABS: 125.0000 ug | ORAL_TABLET | Freq: Every day | ORAL | Status: DC

## 2013-03-30 ENCOUNTER — Other Ambulatory Visit: Payer: Self-pay | Admitting: Internal Medicine

## 2013-03-30 NOTE — Telephone Encounter (Signed)
Okay to refill? Last seen in January. Missed her February appt & rescheduled for April

## 2013-03-30 NOTE — Telephone Encounter (Signed)
Ok to refill x 1.  Will need to keep appt.

## 2013-03-31 ENCOUNTER — Other Ambulatory Visit: Payer: Self-pay | Admitting: *Deleted

## 2013-04-21 ENCOUNTER — Ambulatory Visit: Payer: Medicare Other | Admitting: Internal Medicine

## 2013-05-09 ENCOUNTER — Other Ambulatory Visit: Payer: Self-pay | Admitting: Internal Medicine

## 2013-05-10 NOTE — Telephone Encounter (Signed)
Rx faxed to pharmacy  

## 2013-05-10 NOTE — Telephone Encounter (Signed)
Ok to refill #30 with one refill.  This will get her to her June appt.  She will need to keep this appt to get more refills.  rx on your desk.

## 2013-05-10 NOTE — Telephone Encounter (Signed)
See 03/30/13 refill note, pt cancelled April appt and rescheduled in June

## 2013-06-14 ENCOUNTER — Other Ambulatory Visit: Payer: Self-pay | Admitting: Internal Medicine

## 2013-06-15 NOTE — Telephone Encounter (Signed)
Ppt 06/26/13

## 2013-06-26 ENCOUNTER — Telehealth: Payer: Self-pay | Admitting: Internal Medicine

## 2013-06-26 ENCOUNTER — Ambulatory Visit: Payer: Medicare Other | Admitting: Internal Medicine

## 2013-06-26 NOTE — Telephone Encounter (Signed)
Dr. Nicki Reaper informed

## 2013-06-26 NOTE — Telephone Encounter (Signed)
Patient called to cancelled appt for today at 11:11. Pt rescheduled for 9/14/msn

## 2013-07-26 ENCOUNTER — Other Ambulatory Visit: Payer: Self-pay | Admitting: Internal Medicine

## 2013-07-27 NOTE — Telephone Encounter (Signed)
Refilled lorazepam #30 with one refill.  rx signed and placed on your desk.  Will need to keep scheduled appt.

## 2013-07-27 NOTE — Telephone Encounter (Signed)
Appt sch 09/18/13

## 2013-07-27 NOTE — Telephone Encounter (Signed)
Rx faxed to pharmacy  

## 2013-08-01 ENCOUNTER — Telehealth: Payer: Self-pay | Admitting: *Deleted

## 2013-08-01 MED ORDER — BD ULTRA-FINE LANCETS MISC: Status: DC

## 2013-08-01 MED ORDER — GLUCOSE BLOOD VI STRP: ORAL_STRIP | Status: DC

## 2013-08-01 NOTE — Telephone Encounter (Signed)
Ok to call in or send in rx for contour test strips and if she needs lancets.  Check blood sugar bid.  (3 month supply with one refill).

## 2013-08-01 NOTE — Telephone Encounter (Signed)
Pt called requesting testing supplies for Contour Glucometer.  At review of chart I don't see where pt has been prescribed glucose testing supplies.  Please advise

## 2013-09-09 ENCOUNTER — Other Ambulatory Visit: Payer: Self-pay | Admitting: Internal Medicine

## 2013-09-12 NOTE — Telephone Encounter (Signed)
Can refill each x 1 only.  Call pt and notify her that she needs to keep the appt scheduled on 09/18/13

## 2013-09-12 NOTE — Telephone Encounter (Signed)
Last OV 1.12.15, 5 cancellations and 1 no show since.  Please advise refill

## 2013-09-12 NOTE — Telephone Encounter (Signed)
Noted.  Please call and make sure pt knows she needs to keep her next appt.  Thanks.

## 2013-09-18 ENCOUNTER — Encounter: Payer: Self-pay | Admitting: Internal Medicine

## 2013-09-18 ENCOUNTER — Ambulatory Visit (INDEPENDENT_AMBULATORY_CARE_PROVIDER_SITE_OTHER): Payer: Medicare Other | Admitting: Internal Medicine

## 2013-09-18 VITALS — BP 130/90 | HR 87 | Temp 99.1°F | Ht 66.0 in | Wt 214.5 lb

## 2013-09-18 DIAGNOSIS — E7800 Pure hypercholesterolemia, unspecified: Secondary | ICD-10-CM

## 2013-09-18 DIAGNOSIS — E1149 Type 2 diabetes mellitus with other diabetic neurological complication: Secondary | ICD-10-CM | POA: Diagnosis not present

## 2013-09-18 DIAGNOSIS — E039 Hypothyroidism, unspecified: Secondary | ICD-10-CM

## 2013-09-18 DIAGNOSIS — I10 Essential (primary) hypertension: Secondary | ICD-10-CM | POA: Diagnosis not present

## 2013-09-18 DIAGNOSIS — C50919 Malignant neoplasm of unspecified site of unspecified female breast: Secondary | ICD-10-CM

## 2013-09-18 DIAGNOSIS — J029 Acute pharyngitis, unspecified: Secondary | ICD-10-CM

## 2013-09-18 DIAGNOSIS — Z79899 Other long term (current) drug therapy: Secondary | ICD-10-CM | POA: Diagnosis not present

## 2013-09-18 DIAGNOSIS — R5381 Other malaise: Secondary | ICD-10-CM

## 2013-09-18 DIAGNOSIS — R0602 Shortness of breath: Secondary | ICD-10-CM

## 2013-09-18 DIAGNOSIS — E1142 Type 2 diabetes mellitus with diabetic polyneuropathy: Secondary | ICD-10-CM

## 2013-09-18 DIAGNOSIS — R5383 Other fatigue: Secondary | ICD-10-CM

## 2013-09-18 DIAGNOSIS — K219 Gastro-esophageal reflux disease without esophagitis: Secondary | ICD-10-CM

## 2013-09-18 DIAGNOSIS — E1140 Type 2 diabetes mellitus with diabetic neuropathy, unspecified: Secondary | ICD-10-CM

## 2013-09-18 LAB — CBC WITH DIFFERENTIAL/PLATELET
BASOS PCT: 0.4 % (ref 0.0–3.0)
Basophils Absolute: 0 10*3/uL (ref 0.0–0.1)
EOS PCT: 3 % (ref 0.0–5.0)
Eosinophils Absolute: 0.2 10*3/uL (ref 0.0–0.7)
HCT: 40 % (ref 36.0–46.0)
HEMOGLOBIN: 13.6 g/dL (ref 12.0–15.0)
Lymphocytes Relative: 26.3 % (ref 12.0–46.0)
Lymphs Abs: 1.8 10*3/uL (ref 0.7–4.0)
MCHC: 34.1 g/dL (ref 30.0–36.0)
MCV: 99.3 fl (ref 78.0–100.0)
MONO ABS: 0.6 10*3/uL (ref 0.1–1.0)
Monocytes Relative: 8.1 % (ref 3.0–12.0)
NEUTROS PCT: 62.2 % (ref 43.0–77.0)
Neutro Abs: 4.3 10*3/uL (ref 1.4–7.7)
Platelets: 207 10*3/uL (ref 150.0–400.0)
RBC: 4.03 Mil/uL (ref 3.87–5.11)
RDW: 14 % (ref 11.5–15.5)
WBC: 6.9 10*3/uL (ref 4.0–10.5)

## 2013-09-18 LAB — HEPATIC FUNCTION PANEL
ALT: 30 U/L (ref 0–35)
AST: 41 U/L — ABNORMAL HIGH (ref 0–37)
Albumin: 4.2 g/dL (ref 3.5–5.2)
Alkaline Phosphatase: 74 U/L (ref 39–117)
Bilirubin, Direct: 0.1 mg/dL (ref 0.0–0.3)
TOTAL PROTEIN: 7.7 g/dL (ref 6.0–8.3)
Total Bilirubin: 0.4 mg/dL (ref 0.2–1.2)

## 2013-09-18 LAB — BASIC METABOLIC PANEL
BUN: 20 mg/dL (ref 6–23)
CHLORIDE: 107 meq/L (ref 96–112)
CO2: 25 mEq/L (ref 19–32)
Calcium: 9.9 mg/dL (ref 8.4–10.5)
Creatinine, Ser: 1.1 mg/dL (ref 0.4–1.2)
GFR: 50.66 mL/min — AB (ref >60.00)
Glucose, Bld: 120 mg/dL — ABNORMAL HIGH (ref 70–99)
Potassium: 5.1 mEq/L (ref 3.5–5.1)
SODIUM: 145 meq/L (ref 135–145)

## 2013-09-18 LAB — TSH: TSH: 6.35 u[IU]/mL — ABNORMAL HIGH (ref 0.35–4.50)

## 2013-09-18 LAB — LIPID PANEL
CHOLESTEROL: 186 mg/dL (ref 0–200)
HDL: 48.8 mg/dL (ref >39.00)
LDL Cholesterol: 98 mg/dL (ref 0–99)
NonHDL: 137.2
TRIGLYCERIDES: 195 mg/dL — AB (ref 0.0–149.0)
Total CHOL/HDL Ratio: 4
VLDL: 39 mg/dL (ref 0.0–40.0)

## 2013-09-18 LAB — HEMOGLOBIN A1C: Hgb A1c MFr Bld: 7.2 % — ABNORMAL HIGH (ref 4.6–6.5)

## 2013-09-18 NOTE — Progress Notes (Signed)
Pre visit review using our clinic review tool, if applicable. No additional management support is needed unless otherwise documented below in the visit note. 

## 2013-09-19 ENCOUNTER — Other Ambulatory Visit: Payer: Self-pay | Admitting: Internal Medicine

## 2013-09-19 DIAGNOSIS — R945 Abnormal results of liver function studies: Secondary | ICD-10-CM

## 2013-09-19 DIAGNOSIS — E875 Hyperkalemia: Secondary | ICD-10-CM

## 2013-09-19 DIAGNOSIS — R7989 Other specified abnormal findings of blood chemistry: Secondary | ICD-10-CM

## 2013-09-19 NOTE — Progress Notes (Signed)
Orders placed for f/u labs.  

## 2013-09-20 ENCOUNTER — Encounter: Payer: Self-pay | Admitting: Internal Medicine

## 2013-09-20 ENCOUNTER — Other Ambulatory Visit: Payer: Self-pay | Admitting: *Deleted

## 2013-09-20 DIAGNOSIS — R5381 Other malaise: Secondary | ICD-10-CM | POA: Insufficient documentation

## 2013-09-20 DIAGNOSIS — J029 Acute pharyngitis, unspecified: Secondary | ICD-10-CM | POA: Insufficient documentation

## 2013-09-20 DIAGNOSIS — R5383 Other fatigue: Secondary | ICD-10-CM

## 2013-09-20 LAB — CULTURE, GROUP A STREP: Organism ID, Bacteria: NORMAL

## 2013-09-20 MED ORDER — LEVOTHYROXINE SODIUM 137 MCG PO TABS: 137.0000 ug | ORAL_TABLET | Freq: Every day | ORAL | Status: DC

## 2013-09-20 NOTE — Assessment & Plan Note (Signed)
On Lipitor.  Follow cholesterol and liver panel.  Due labs.  Schedule lipid profile and liver panel.

## 2013-09-20 NOTE — Assessment & Plan Note (Signed)
Blood pressure has been under good control.  Same medication regimen.  Follow.  Follow metabolic panel.   

## 2013-09-20 NOTE — Assessment & Plan Note (Signed)
On thyroid replacement.  Follow tsh.  

## 2013-09-20 NOTE — Progress Notes (Signed)
Subjective:    [name], female    DOB: July 26, 1940, 73 y.o.   MRN: [id number]  HPI 73 year old female with past history of diabetes, hypertension, hypercholesterolemia, hypothyroidism and breast cancer who comes in today for a scheduled follow up.  She has missed multiple appts.  States she has been doing relatively well.  On metformin and amaryl now.  States her sugars are averaging <130.  Takes her sugar 2-3x/week.  Not exercising.  Plans to get more serious about her diet and exercise.  Is doing better now, trying to watch what she eats.  She does report sore throat.  Present for a few days.   No chest pain.  Increased fatigue with exertion.   Sob with exertion.  Notices at times.   No acid reflux.  No nausea or vomiting.  No bowel change.  Still being followed at the [facility] for her breast.  Does report occasional fatigue.  Does report pain over her left ribs.  Occurred after reaching over chair.  Occurred one month ago.  Better now.      Past Medical History  Diagnosis Date  . Hypercholesterolemia   . Hypothyroidism   . Hypertension   . Asthma     questionable  . Breast cancer     s/p chemotherapy and XRT  . Depression   . Hx of vertigo   . Diabetes   . Polio 1948    history  . GERD (gastroesophageal reflux disease)     Current Outpatient Prescriptions on File Prior to Visit  Medication Sig Dispense Refill  . atorvastatin (LIPITOR) 10 MG tablet TAKE ONE TABLET BY MOUTH ONCE DAILY  30 tablet  5  . BD ULTRA-FINE LANCETS lancets Test blood glucose BID DX code 250.00  200 each  1  . glimepiride (AMARYL) 2 MG tablet TAKE ONE TABLET BY MOUTH TWICE DAILY  60 tablet  1  . glucose blood (ONE TOUCH ULTRA TEST) test strip Test blood sugar BID Dx Code:  250.00 Pt uses Contour Meter  200 each  1  . LORazepam (ATIVAN) 1 MG tablet TAKE ONE TABLET BY MOUTH ONCE DAILY AS NEEDED FOR ANXIETY *MUST SCHEDULE APPOINTMENT FOR ADDITIONAL REFILLS*  30 tablet  1  .  losartan (COZAAR) 100 MG tablet TAKE ONE TABLET BY MOUTH ONCE DAILY  30 tablet  1  . metFORMIN (GLUCOPHAGE) 1000 MG tablet Take 1 tablet (1,000 mg total) by mouth 2 (two) times daily with a meal. PLEASE CONTACT OFFICE TO RESCHEDULE MISSED APPOINTMENT  60 tablet  5  . metoprolol succinate (TOPROL-XL) 25 MG 24 hr tablet TAKE ONE TABLET BY MOUTH TWICE DAILY  60 tablet  5  . pantoprazole (PROTONIX) 40 MG tablet TAKE ONE TABLET BY MOUTH TWICE DAILY  60 tablet  1  . traMADol (ULTRAM) 50 MG tablet Take 1 tablet (50 mg total) by mouth 2 (two) times daily as needed.  30 tablet  0  . venlafaxine XR (EFFEXOR-XR) 150 MG 24 hr capsule TAKE ONE CAPSULE BY MOUTH ONCE DAILY  30 capsule  1   No current facility-administered medications on file prior to visit.    Review of Systems Patient denies any headache, lightheadedness or dizziness.  Does report sore throat.  Hurts to swallow.  Some sneezing.  No increased sinus congestion or pressure.   No chest pain, tightness or palpitations.  No cough or congestion.  Does report some increased fatigue with exertion.  Some sob with exertion.  No nausea or vomiting.  No abdominal pain or cramping.  No bowel change, such as diarrhea, constipation, BRBPR or melana.  No urine change. Feels she is handling stress relatively well.  Takes lorazepam prn.  Sugars as outlined.  Left rib pain as outlined.  Occasional fatigue.        Objective:   Physical Exam  Filed Vitals:   09/18/13 1117  BP: 130/90  Pulse: 87  Temp: 99.1 F (37.3 C)   Blood pressure recheck:  59/43  73 year old female in no acute distress.   HEENT:  Nares- clear.  Oropharynx - without lesions. Erythema - posterior oropharynx. NECK:  Supple.  Nontender.  No audible bruit.  HEART:  Appears to be regular. LUNGS:  No crackles or wheezing audible.  Respirations even and unlabored.  RADIAL PULSE:  Equal bilaterally.   ABDOMEN:  Soft, nontender.  Bowel sounds present and normal.  No audible abdominal bruit.   EXTREMITIES:  No increased edema present.  DP pulses palpable and equal bilaterally.   FEET:  No lesions.         Assessment & Plan:  INCREASED PSYCHOSOCIAL STRESSORS.  On Effexor.  Takes lorazepam prn.  Follow.  Stable.   CARDIOVASCULAR.  No chest pain.  Stable.  Continue risk factor modification.   PULMONARY.  Breathing stable.     HEALTH MAINTENANCE.  Continue to follow up at the [facility] regarding her mammogram.    Declines colonoscopy.  Schedule a physical next visit.  She is going to contact the cancer center about her f/u.

## 2013-09-20 NOTE — Assessment & Plan Note (Signed)
On metformin and Amaryl.   Low carb diet and exercise.  Check metabolic panel and A7O.  Needs eye check.  Has missed multiple appts.  Discussed with her the importance of checking sugars regularly, diet and exercise.  Discussed importance of keeping regular appts.

## 2013-09-20 NOTE — Assessment & Plan Note (Signed)
On protonix.  Follow.  

## 2013-09-20 NOTE — Assessment & Plan Note (Signed)
Reports increased fatigue and some sob with exertion.  Worse at times.  EKG obtained and revealed SR with RBBB.  Given risk factors and symptoms, will obtain stress echo to confirm no active ischemia.

## 2013-09-20 NOTE — Assessment & Plan Note (Addendum)
Continues to follow up at the [facility].  Is going to call for follow up.

## 2013-09-20 NOTE — Assessment & Plan Note (Signed)
Gargle with salt water.  Obtain throat culture.

## 2013-09-27 ENCOUNTER — Ambulatory Visit: Payer: Self-pay | Admitting: Oncology

## 2013-10-02 ENCOUNTER — Other Ambulatory Visit: Payer: Medicare Other

## 2013-10-09 ENCOUNTER — Other Ambulatory Visit: Payer: Medicare Other

## 2013-10-11 ENCOUNTER — Encounter: Payer: Self-pay | Admitting: Internal Medicine

## 2013-10-17 ENCOUNTER — Other Ambulatory Visit: Payer: Self-pay | Admitting: Internal Medicine

## 2013-10-17 NOTE — Telephone Encounter (Signed)
Last OV 9.4.15, last refill 9.5.15.  Please advise refill

## 2013-10-18 NOTE — Telephone Encounter (Signed)
Refilled lorazepam #30 with one refill.

## 2013-10-18 NOTE — Telephone Encounter (Signed)
Rx faxed

## 2013-10-20 ENCOUNTER — Other Ambulatory Visit: Payer: Medicare Other

## 2013-10-20 ENCOUNTER — Telehealth: Payer: Self-pay | Admitting: *Deleted

## 2013-10-20 DIAGNOSIS — R0989 Other specified symptoms and signs involving the circulatory and respiratory systems: Secondary | ICD-10-CM

## 2013-10-20 NOTE — Telephone Encounter (Signed)
LVM to review stress echo instructions

## 2013-10-23 ENCOUNTER — Other Ambulatory Visit (INDEPENDENT_AMBULATORY_CARE_PROVIDER_SITE_OTHER): Payer: Medicare Other

## 2013-10-23 ENCOUNTER — Telehealth: Payer: Self-pay | Admitting: Internal Medicine

## 2013-10-23 DIAGNOSIS — R7989 Other specified abnormal findings of blood chemistry: Secondary | ICD-10-CM | POA: Diagnosis not present

## 2013-10-23 DIAGNOSIS — R945 Abnormal results of liver function studies: Secondary | ICD-10-CM

## 2013-10-23 DIAGNOSIS — E875 Hyperkalemia: Secondary | ICD-10-CM

## 2013-10-23 LAB — HEPATIC FUNCTION PANEL
ALK PHOS: 73 U/L (ref 39–117)
ALT: 38 U/L — AB (ref 0–35)
AST: 48 U/L — ABNORMAL HIGH (ref 0–37)
Albumin: 3.5 g/dL (ref 3.5–5.2)
BILIRUBIN TOTAL: 0.6 mg/dL (ref 0.2–1.2)
Bilirubin, Direct: 0.1 mg/dL (ref 0.0–0.3)
Total Protein: 8.3 g/dL (ref 6.0–8.3)

## 2013-10-23 LAB — POTASSIUM: Potassium: 5.1 mEq/L (ref 3.5–5.1)

## 2013-10-23 NOTE — Telephone Encounter (Signed)
Left patient voicemail with information to call & reschedule on her own

## 2013-10-23 NOTE — Telephone Encounter (Signed)
Since it has been ordered, I don't think we need to reorder.  She just needs to call and reschedule.  Let us know if any problems.    Dr Nicki Reaper

## 2013-10-23 NOTE — Telephone Encounter (Signed)
Joyce Maynard came by for blood work and said she wasn't aware she had a stress test scheduled on 10/16. She would like that to be rescheduled for her. Please call the pt if needed. Thank you.

## 2013-10-24 ENCOUNTER — Other Ambulatory Visit: Payer: Self-pay | Admitting: Internal Medicine

## 2013-10-24 DIAGNOSIS — R945 Abnormal results of liver function studies: Secondary | ICD-10-CM

## 2013-10-24 DIAGNOSIS — R7989 Other specified abnormal findings of blood chemistry: Secondary | ICD-10-CM

## 2013-10-24 NOTE — Progress Notes (Signed)
F/u liver panel ordered.  

## 2013-11-06 ENCOUNTER — Ambulatory Visit: Payer: Medicare Other | Admitting: Internal Medicine

## 2013-11-06 DIAGNOSIS — Z0289 Encounter for other administrative examinations: Secondary | ICD-10-CM

## 2013-11-13 ENCOUNTER — Ambulatory Visit: Payer: Self-pay | Admitting: Oncology

## 2013-11-18 ENCOUNTER — Other Ambulatory Visit: Payer: Self-pay | Admitting: Internal Medicine

## 2013-11-20 NOTE — Telephone Encounter (Signed)
Please advise refills.  Multiple no shows

## 2013-11-20 NOTE — Telephone Encounter (Signed)
Did you call and notify pt that she needed to make an appt or either make her an appt.

## 2013-11-20 NOTE — Telephone Encounter (Signed)
Left a detailed message on VM as no one answered the phone.  I didn't want to schedule without speaking with pt to assure pts availability.

## 2013-11-20 NOTE — Telephone Encounter (Signed)
Notify pt that she missed her last appt.  She needs to reschedule.  Needs a f/u appt.  Ok to refill x 1

## 2013-11-21 NOTE — Telephone Encounter (Signed)
Attempted to call pt again, no answer.  No VM this morning.

## 2013-11-21 NOTE — Telephone Encounter (Signed)
Spoke with pharmacy, asked them to put a note on pts Rx to call [facility] for appoint.

## 2013-12-18 ENCOUNTER — Other Ambulatory Visit: Payer: Self-pay | Admitting: Internal Medicine

## 2014-01-10 ENCOUNTER — Other Ambulatory Visit: Payer: Self-pay | Admitting: Internal Medicine

## 2014-01-10 NOTE — Telephone Encounter (Signed)
Last OV 9.14.15, last refill 11.14.15.  Several No Shows.  Please advise refill

## 2014-01-10 NOTE — Telephone Encounter (Signed)
Will refill lorazepam x 1.  Needs to keep her upcoming appt.

## 2014-01-17 ENCOUNTER — Other Ambulatory Visit: Payer: Self-pay | Admitting: Internal Medicine

## 2014-01-25 ENCOUNTER — Ambulatory Visit: Payer: Medicare Other | Admitting: Internal Medicine

## 2014-02-11 ENCOUNTER — Other Ambulatory Visit: Payer: Self-pay | Admitting: Internal Medicine

## 2014-02-12 NOTE — Telephone Encounter (Signed)
rx faxed

## 2014-02-12 NOTE — Telephone Encounter (Signed)
Last OV 9.14.15, last refill 1.6.16.  Please advise refill

## 2014-02-12 NOTE — Telephone Encounter (Signed)
Refilled ativan #30 with one refill.  Notify has to keep next scheduled appt.

## 2014-02-16 ENCOUNTER — Other Ambulatory Visit: Payer: Self-pay | Admitting: Internal Medicine

## 2014-03-16 ENCOUNTER — Other Ambulatory Visit: Payer: Self-pay | Admitting: Internal Medicine

## 2014-03-30 ENCOUNTER — Ambulatory Visit: Payer: Medicare Other | Admitting: Internal Medicine

## 2014-04-12 ENCOUNTER — Ambulatory Visit (INDEPENDENT_AMBULATORY_CARE_PROVIDER_SITE_OTHER): Payer: Medicare Other | Admitting: Internal Medicine

## 2014-04-12 ENCOUNTER — Encounter: Payer: Self-pay | Admitting: Internal Medicine

## 2014-04-12 VITALS — BP 126/78 | HR 89 | Temp 98.6°F | Ht 66.0 in | Wt 211.1 lb

## 2014-04-12 DIAGNOSIS — E7800 Pure hypercholesterolemia, unspecified: Secondary | ICD-10-CM

## 2014-04-12 DIAGNOSIS — Z23 Encounter for immunization: Secondary | ICD-10-CM | POA: Diagnosis not present

## 2014-04-12 DIAGNOSIS — E039 Hypothyroidism, unspecified: Secondary | ICD-10-CM | POA: Diagnosis not present

## 2014-04-12 DIAGNOSIS — I10 Essential (primary) hypertension: Secondary | ICD-10-CM | POA: Diagnosis not present

## 2014-04-12 DIAGNOSIS — C50919 Malignant neoplasm of unspecified site of unspecified female breast: Secondary | ICD-10-CM

## 2014-04-12 DIAGNOSIS — F439 Reaction to severe stress, unspecified: Secondary | ICD-10-CM

## 2014-04-12 DIAGNOSIS — E1140 Type 2 diabetes mellitus with diabetic neuropathy, unspecified: Secondary | ICD-10-CM

## 2014-04-12 DIAGNOSIS — K219 Gastro-esophageal reflux disease without esophagitis: Secondary | ICD-10-CM | POA: Diagnosis not present

## 2014-04-12 DIAGNOSIS — Z658 Other specified problems related to psychosocial circumstances: Secondary | ICD-10-CM

## 2014-04-12 MED ORDER — VENLAFAXINE HCL 75 MG PO TABS: ORAL_TABLET | ORAL | Status: DC

## 2014-04-15 ENCOUNTER — Other Ambulatory Visit: Payer: Self-pay | Admitting: Internal Medicine

## 2014-04-16 ENCOUNTER — Encounter: Payer: Self-pay | Admitting: Internal Medicine

## 2014-04-16 DIAGNOSIS — F32A Depression, unspecified: Secondary | ICD-10-CM | POA: Insufficient documentation

## 2014-04-16 DIAGNOSIS — F320 Major depressive disorder, single episode, mild: Secondary | ICD-10-CM | POA: Insufficient documentation

## 2014-04-16 NOTE — Assessment & Plan Note (Signed)
Controlled on protonix.  Follow.   

## 2014-04-16 NOTE — Assessment & Plan Note (Signed)
Increased stress.  Discussed with her today.  She wanted to increase the lorazepam.  Would rather increase effexor.  Will increase effexor to 225mg  q day.  Follow.  Get her back in soon to reassess.  Monitor blood pressure.

## 2014-04-16 NOTE — Assessment & Plan Note (Signed)
States no longer being followed at the cancer center.  Overdue mammogram.  Schedule.

## 2014-04-16 NOTE — Assessment & Plan Note (Signed)
On replacement.  Recheck tsh.  Last check slightly elevated.  Never returned for lab.

## 2014-04-16 NOTE — Assessment & Plan Note (Signed)
Brought in no recorded sugar readings.  Is non compliant with appts and lab appts.  Discussed the importance of regular f/u.  States am sugars averaging 150.  Check metabolic panel and M2T.

## 2014-04-16 NOTE — Progress Notes (Signed)
[name], female   DOB: Joyce Maynard, 74 y.o.   MRN: [id number]   Subjective:    [name], female    DOB: Joyce Maynard, 74 y.o.   MRN: [id number]  HPI  Patient here for a scheduled follow up.  Is non compliant with keeping regular appts.  Discussed with her again today regarding the importance of regular f/u and regular labs.  States not checking sugars regularly.  Recent am check 150.  Discussed diet and exercise.  No cardiac symptoms with increased activity or exertion.  Breathing stable.  Overdue mammogram.  Increased stress.  She was questioning need to increase lorazepam.  We discussed this in detail.  Would rather increase effexor.     Past Medical History  Diagnosis Date  . Hypercholesterolemia   . Hypothyroidism   . Hypertension   . Asthma     questionable  . Breast cancer     s/p chemotherapy and XRT  . Depression   . Hx of vertigo   . Diabetes   . Polio 1948    history  . GERD (gastroesophageal reflux disease)     Current Outpatient Prescriptions on File Prior to Visit  Medication Sig Dispense Refill  . atorvastatin (LIPITOR) 10 MG tablet TAKE ONE TABLET BY MOUTH ONCE DAILY 30 tablet 5  . atorvastatin (LIPITOR) 10 MG tablet TAKE ONE TABLET BY MOUTH ONCE DAILY 30 tablet 0  . atorvastatin (LIPITOR) 10 MG tablet TAKE ONE TABLET BY MOUTH ONCE DAILY 30 tablet 5  . BD ULTRA-FINE LANCETS lancets Test blood glucose BID DX code 250.00 200 each 1  . glimepiride (AMARYL) 2 MG tablet TAKE ONE TABLET BY MOUTH TWICE DAILY 60 tablet 5  . glucose blood (ONE TOUCH ULTRA TEST) test strip Test blood sugar BID Dx Code:  250.00 Pt uses Contour Meter 200 each 1  . levothyroxine (SYNTHROID, LEVOTHROID) 137 MCG tablet TAKE ONE TABLET BY MOUTH ONCE DAILY BEFORE BREAKFAST. *PLEASE SCHEDULE LAB APPOINTMENT PRIOR TO NEXT FILL* 30 tablet 0  . LORazepam (ATIVAN) 1 MG tablet TAKE ONE TABLET BY MOUTH ONCE DAILY AS NEEDED FOR ANXIETY 30 tablet 1  . losartan  (COZAAR) 100 MG tablet TAKE ONE TABLET BY MOUTH ONCE DAILY 30 tablet 5  . metFORMIN (GLUCOPHAGE) 1000 MG tablet Take 1 tablet (1,000 mg total) by mouth 2 (two) times daily with a meal. 60 tablet 5  . metoprolol succinate (TOPROL-XL) 25 MG 24 hr tablet TAKE ONE TABLET BY MOUTH TWICE DAILY 60 tablet 5  . pantoprazole (PROTONIX) 40 MG tablet TAKE ONE TABLET BY MOUTH TWICE DAILY 60 tablet 5   No current facility-administered medications on file prior to visit.    Review of Systems  Constitutional: Negative for appetite change and unexpected weight change.  HENT: Negative for congestion and sinus pressure.   Respiratory: Negative for cough, chest tightness and shortness of breath.   Cardiovascular: Negative for chest pain, palpitations and leg swelling.  Gastrointestinal: Negative for nausea, vomiting, abdominal pain and diarrhea.  Genitourinary: Negative for dysuria and difficulty urinating.  Skin: Negative for color change and rash.  Neurological: Negative for dizziness, facial asymmetry and headaches.  Psychiatric/Behavioral: Negative for dysphoric mood and agitation.       Objective:     Blood pressure recheck:  128/78  Physical Exam  Constitutional: She appears well-developed and well-nourished. No distress.  HENT:  Nose: Nose normal.  Mouth/Throat: Oropharynx is clear and moist.  Neck: Neck supple. No thyromegaly present.  Cardiovascular: Normal rate and regular rhythm.   Pulmonary/Chest: Breath sounds normal. No respiratory distress. She has no wheezes.  Abdominal: Soft. Bowel sounds are normal. There is no tenderness.  Musculoskeletal: She exhibits no edema or tenderness.  Lymphadenopathy:    She has no cervical adenopathy.  Skin: No rash noted. No erythema.    BP 126/78 mmHg  Pulse 89  Temp(Src) 98.6 F (37 C) (Oral)  Ht 5\' 6"  (1.676 m)  Wt 211 lb 2 oz (95.766 kg)  BMI 34.09 kg/m2  SpO2 96% Wt Readings from Last 3 Encounters:  04/12/14 211 lb 2 oz (95.766 kg)    09/18/13 214 lb 8 oz (97.297 kg)  01/16/13 208 lb 8 oz (94.575 kg)     Lab Results  Component Value Date   WBC 6.9 09/18/2013   HGB 13.6 09/18/2013   HCT 40.0 09/18/2013   PLT 207.0 09/18/2013   GLUCOSE 120* 09/18/2013   CHOL 186 09/18/2013   TRIG 195.0* 09/18/2013   HDL 48.80 09/18/2013   LDLDIRECT 109.9 11/01/2012   LDLCALC 98 09/18/2013   ALT 38* 10/23/2013   AST 48* 10/23/2013   NA 145 09/18/2013   K 5.1 10/23/2013   CL 107 09/18/2013   CREATININE 1.1 09/18/2013   BUN 20 09/18/2013   CO2 25 09/18/2013   TSH 6.35* 09/18/2013   HGBA1C 7.2* 09/18/2013   MICROALBUR 4.2* 11/01/2012       Assessment & Plan:   Problem List Items Addressed This Visit    Breast cancer    States no longer being followed at the cancer center.  Overdue mammogram.  Schedule.       Relevant Orders   CBC with Differential/Platelet   MM Digital Diagnostic Bilat   Diabetes    Brought in no recorded sugar readings.  Is non compliant with appts and lab appts.  Discussed the importance of regular f/u.  States am sugars averaging 150.  Check metabolic panel and X6I.        Relevant Orders   Hemoglobin A1c   Essential hypertension, benign - Primary    Blood pressure is doing well.  Continue same medication regimen.  Follow metabolic panel.        Relevant Orders   Basic metabolic panel   GERD (gastroesophageal reflux disease)    Controlled on protonix.  Follow.        Hypercholesterolemia    On atorvastatin.  Low cholesterol diet and exercise.  Follow lipid panel and liver function tests.       Relevant Orders   Lipid panel   Hepatic function panel   Hypothyroidism    On replacement.  Recheck tsh.  Last check slightly elevated.  Never returned for lab.       Relevant Orders   TSH   Stress    Increased stress.  Discussed with her today.  She wanted to increase the lorazepam.  Would rather increase effexor.  Will increase effexor to 225mg  q day.  Follow.  Get her back in soon to  reassess.  Monitor blood pressure.        Other Visit Diagnoses    Need for prophylactic vaccination against Streptococcus pneumoniae (pneumococcus)        Relevant Orders    Pneumococcal conjugate vaccine 13-valent (Completed)      I spent 25 minutes with the patient and more than 50% of the time was spent in consultation regarding the above.     Einar Pheasant, MD

## 2014-04-16 NOTE — Assessment & Plan Note (Signed)
On atorvastatin.  Low cholesterol diet and exercise.  Follow lipid panel and liver function tests.  

## 2014-04-16 NOTE — Telephone Encounter (Signed)
Refilled lorazepam and levothyroxine #30 with no refills.

## 2014-04-16 NOTE — Telephone Encounter (Signed)
Rx faxed

## 2014-04-16 NOTE — Telephone Encounter (Signed)
Last OV 4.8.16.  Labs not drawn yet.  Please advise refills

## 2014-04-16 NOTE — Assessment & Plan Note (Signed)
Blood pressure is doing well.  Continue same medication regimen.  Follow metabolic panel.

## 2014-04-18 NOTE — Telephone Encounter (Signed)
Triage line phone call received to confirm that office sent Rx for Ativan was Faxed to [facility] on [address].  Confirmed with pharmacist that yes we did fax the prescription so it could be filled.

## 2014-04-24 ENCOUNTER — Other Ambulatory Visit: Payer: Medicare Other

## 2014-04-30 ENCOUNTER — Other Ambulatory Visit (INDEPENDENT_AMBULATORY_CARE_PROVIDER_SITE_OTHER): Payer: Medicare Other

## 2014-04-30 DIAGNOSIS — I10 Essential (primary) hypertension: Secondary | ICD-10-CM | POA: Diagnosis not present

## 2014-04-30 DIAGNOSIS — C50919 Malignant neoplasm of unspecified site of unspecified female breast: Secondary | ICD-10-CM | POA: Diagnosis not present

## 2014-04-30 DIAGNOSIS — E1140 Type 2 diabetes mellitus with diabetic neuropathy, unspecified: Secondary | ICD-10-CM | POA: Diagnosis not present

## 2014-04-30 DIAGNOSIS — E7800 Pure hypercholesterolemia, unspecified: Secondary | ICD-10-CM

## 2014-04-30 DIAGNOSIS — E039 Hypothyroidism, unspecified: Secondary | ICD-10-CM | POA: Diagnosis not present

## 2014-04-30 LAB — BASIC METABOLIC PANEL
BUN: 13 mg/dL (ref 6–23)
CALCIUM: 9.4 mg/dL (ref 8.4–10.5)
CO2: 27 meq/L (ref 19–32)
Chloride: 107 mEq/L (ref 96–112)
Creatinine, Ser: 0.87 mg/dL (ref 0.40–1.20)
GFR: 67.69 mL/min (ref >60.00)
Glucose, Bld: 145 mg/dL — ABNORMAL HIGH (ref 70–99)
Potassium: 4.5 mEq/L (ref 3.5–5.1)
Sodium: 141 mEq/L (ref 135–145)

## 2014-04-30 LAB — CBC WITH DIFFERENTIAL/PLATELET
Basophils Absolute: 0 10*3/uL (ref 0.0–0.1)
Basophils Relative: 0.5 % (ref 0.0–3.0)
Eosinophils Absolute: 0.2 10*3/uL (ref 0.0–0.7)
Eosinophils Relative: 2.8 % (ref 0.0–5.0)
HCT: 39.6 % (ref 36.0–46.0)
Hemoglobin: 13.3 g/dL (ref 12.0–15.0)
LYMPHS PCT: 30 % (ref 12.0–46.0)
Lymphs Abs: 1.7 10*3/uL (ref 0.7–4.0)
MCHC: 33.6 g/dL (ref 30.0–36.0)
MCV: 98.6 fl (ref 78.0–100.0)
MONO ABS: 0.5 10*3/uL (ref 0.1–1.0)
Monocytes Relative: 8.4 % (ref 3.0–12.0)
NEUTROS PCT: 58.3 % (ref 43.0–77.0)
Neutro Abs: 3.3 10*3/uL (ref 1.4–7.7)
Platelets: 220 10*3/uL (ref 150.0–400.0)
RBC: 4.01 Mil/uL (ref 3.87–5.11)
RDW: 14.5 % (ref 11.5–15.5)
WBC: 5.7 10*3/uL (ref 4.0–10.5)

## 2014-04-30 LAB — LIPID PANEL
CHOL/HDL RATIO: 4
CHOLESTEROL: 173 mg/dL (ref 0–200)
HDL: 45.4 mg/dL (ref >39.00)
LDL Cholesterol: 95 mg/dL (ref 0–99)
NonHDL: 127.6
Triglycerides: 161 mg/dL — ABNORMAL HIGH (ref 0.0–149.0)
VLDL: 32.2 mg/dL (ref 0.0–40.0)

## 2014-04-30 LAB — HEMOGLOBIN A1C: Hgb A1c MFr Bld: 7.2 % — ABNORMAL HIGH (ref 4.6–6.5)

## 2014-04-30 LAB — HEPATIC FUNCTION PANEL
ALK PHOS: 73 U/L (ref 39–117)
ALT: 27 U/L (ref 0–35)
AST: 26 U/L (ref 0–37)
Albumin: 4.1 g/dL (ref 3.5–5.2)
BILIRUBIN TOTAL: 0.5 mg/dL (ref 0.2–1.2)
Bilirubin, Direct: 0.1 mg/dL (ref 0.0–0.3)
TOTAL PROTEIN: 7.3 g/dL (ref 6.0–8.3)

## 2014-04-30 LAB — TSH: TSH: 1.27 u[IU]/mL (ref 0.35–4.50)

## 2014-05-01 ENCOUNTER — Encounter: Payer: Self-pay | Admitting: *Deleted

## 2014-05-04 ENCOUNTER — Telehealth: Payer: Self-pay | Admitting: *Deleted

## 2014-05-04 DIAGNOSIS — C50919 Malignant neoplasm of unspecified site of unspecified female breast: Secondary | ICD-10-CM

## 2014-05-04 NOTE — Telephone Encounter (Signed)
Order signed.

## 2014-05-04 NOTE — Telephone Encounter (Signed)
Joyce Maynard was scheduling pt's diagnostic mammogram at [facility]. [place] states order needs to be changed to just a screening mammo rather than diagnostic because pt is past 7 years since cancer diagnosis. Appt has been scheduled 05/25/14. Just need order changed. I can change if you ok it?

## 2014-05-04 NOTE — Telephone Encounter (Signed)
It won't let me sign it, sorry. Routed back to Dr. Nicki Reaper to complete.

## 2014-05-04 NOTE — Telephone Encounter (Signed)
ok 

## 2014-05-17 ENCOUNTER — Other Ambulatory Visit: Payer: Self-pay | Admitting: Internal Medicine

## 2014-05-18 NOTE — Telephone Encounter (Signed)
ok'd rx for levothyroxine #30 with 3 refills, metoprolol #60 with 3 refills and lorazepam #30 with one refill.  rx signed and on your desk

## 2014-05-18 NOTE — Telephone Encounter (Signed)
Last OV 04/12/14, Last refill on ativan 04/16/14, Okay to refill?

## 2014-05-25 ENCOUNTER — Ambulatory Visit: Payer: Medicare Other

## 2014-05-31 ENCOUNTER — Ambulatory Visit: Payer: Medicare Other | Attending: Internal Medicine

## 2014-07-03 ENCOUNTER — Encounter: Payer: Medicare Other | Admitting: Internal Medicine

## 2014-07-05 ENCOUNTER — Telehealth: Payer: Self-pay | Admitting: Internal Medicine

## 2014-07-05 ENCOUNTER — Other Ambulatory Visit: Payer: Self-pay | Admitting: *Deleted

## 2014-07-05 NOTE — Telephone Encounter (Signed)
She did not show for her last appt.  If concern over medication, needs to schedule an appt to discuss.

## 2014-07-05 NOTE — Telephone Encounter (Signed)
Pt scheduled 7.6.16 at 9:30 to discuss medication

## 2014-07-05 NOTE — Telephone Encounter (Signed)
Pt called states she is taking 225 mg Venlafaxine which causes her to take 3 75mg  tablets.  Pt states the tablets taste nasty, pt is requesting to change the tablets to capsules.  When I pull up the capsules it appears that they are the XL strength.  Please advise

## 2014-07-11 ENCOUNTER — Ambulatory Visit: Payer: Medicare Other | Admitting: Internal Medicine

## 2014-07-29 ENCOUNTER — Other Ambulatory Visit: Payer: Self-pay | Admitting: Internal Medicine

## 2014-07-30 NOTE — Telephone Encounter (Signed)
Please advise refill? Patient has cancelled or no showed last two appointments?

## 2014-07-30 NOTE — Telephone Encounter (Signed)
I refilled her lorazepam #30 with one refill.  She needs to keep her appt in September.  Will not continue to refill.

## 2014-07-31 ENCOUNTER — Other Ambulatory Visit: Payer: Self-pay | Admitting: Internal Medicine

## 2014-07-31 NOTE — Telephone Encounter (Signed)
Last OV 4.25.16, Next OV 9.29.16, has had 2 no shows and 2 cancellations since last visit. Please advise refill.

## 2014-07-31 NOTE — Telephone Encounter (Signed)
Rx faxed

## 2014-08-01 NOTE — Telephone Encounter (Signed)
Was just refilled on 07/30/14 - has to keep next appt.

## 2014-10-01 ENCOUNTER — Other Ambulatory Visit: Payer: Self-pay | Admitting: Internal Medicine

## 2014-10-01 NOTE — Telephone Encounter (Signed)
Please advise on refills due to patient no show history

## 2014-10-01 NOTE — Telephone Encounter (Signed)
I will refill her meds x 1, but needs to be informed needs to keep appt on 10/04/14.  Thanks

## 2014-10-02 NOTE — Telephone Encounter (Signed)
Left message to notify pt that she needs to keep her appt on 9/29 for any additional refills.

## 2014-10-04 ENCOUNTER — Ambulatory Visit (INDEPENDENT_AMBULATORY_CARE_PROVIDER_SITE_OTHER): Payer: Medicare Other | Admitting: Internal Medicine

## 2014-10-04 ENCOUNTER — Encounter: Payer: Self-pay | Admitting: Internal Medicine

## 2014-10-04 VITALS — BP 100/70 | HR 87 | Temp 98.3°F | Resp 18 | Ht 65.5 in | Wt 213.0 lb

## 2014-10-04 DIAGNOSIS — E039 Hypothyroidism, unspecified: Secondary | ICD-10-CM

## 2014-10-04 DIAGNOSIS — I10 Essential (primary) hypertension: Secondary | ICD-10-CM

## 2014-10-04 DIAGNOSIS — E7800 Pure hypercholesterolemia, unspecified: Secondary | ICD-10-CM

## 2014-10-04 DIAGNOSIS — F32A Depression, unspecified: Secondary | ICD-10-CM

## 2014-10-04 DIAGNOSIS — Z23 Encounter for immunization: Secondary | ICD-10-CM | POA: Diagnosis not present

## 2014-10-04 DIAGNOSIS — F329 Major depressive disorder, single episode, unspecified: Secondary | ICD-10-CM

## 2014-10-04 DIAGNOSIS — F320 Major depressive disorder, single episode, mild: Secondary | ICD-10-CM

## 2014-10-04 DIAGNOSIS — E1140 Type 2 diabetes mellitus with diabetic neuropathy, unspecified: Secondary | ICD-10-CM | POA: Diagnosis not present

## 2014-10-04 DIAGNOSIS — C50919 Malignant neoplasm of unspecified site of unspecified female breast: Secondary | ICD-10-CM

## 2014-10-04 DIAGNOSIS — K219 Gastro-esophageal reflux disease without esophagitis: Secondary | ICD-10-CM

## 2014-10-04 DIAGNOSIS — Z0000 Encounter for general adult medical examination without abnormal findings: Secondary | ICD-10-CM

## 2014-10-04 LAB — BASIC METABOLIC PANEL
BUN: 14 mg/dL (ref 6–23)
CHLORIDE: 104 meq/L (ref 96–112)
CO2: 28 mEq/L (ref 19–32)
Calcium: 9.7 mg/dL (ref 8.4–10.5)
Creatinine, Ser: 0.89 mg/dL (ref 0.40–1.20)
GFR: 65.86 mL/min (ref >60.00)
Glucose, Bld: 200 mg/dL — ABNORMAL HIGH (ref 70–99)
POTASSIUM: 5 meq/L (ref 3.5–5.1)
SODIUM: 139 meq/L (ref 135–145)

## 2014-10-04 LAB — HEPATIC FUNCTION PANEL
ALBUMIN: 4 g/dL (ref 3.5–5.2)
ALT: 36 U/L — ABNORMAL HIGH (ref 0–35)
AST: 44 U/L — ABNORMAL HIGH (ref 0–37)
Alkaline Phosphatase: 66 U/L (ref 39–117)
BILIRUBIN DIRECT: 0.2 mg/dL (ref 0.0–0.3)
TOTAL PROTEIN: 7.7 g/dL (ref 6.0–8.3)
Total Bilirubin: 0.6 mg/dL (ref 0.2–1.2)

## 2014-10-04 LAB — LIPID PANEL
CHOL/HDL RATIO: 3
Cholesterol: 162 mg/dL (ref 0–200)
HDL: 46.7 mg/dL (ref >39.00)
LDL CALC: 84 mg/dL (ref 0–99)
NonHDL: 115.39
TRIGLYCERIDES: 157 mg/dL — AB (ref 0.0–149.0)
VLDL: 31.4 mg/dL (ref 0.0–40.0)

## 2014-10-04 LAB — MICROALBUMIN / CREATININE URINE RATIO
CREATININE, U: 95.5 mg/dL
MICROALB/CREAT RATIO: 0.7 mg/g (ref 0.0–30.0)
Microalb, Ur: <0.7 mg/dL (ref 0.0–1.9)

## 2014-10-04 LAB — HEMOGLOBIN A1C: HEMOGLOBIN A1C: 7.4 % — AB (ref 4.6–6.5)

## 2014-10-04 LAB — TSH: TSH: 1.19 u[IU]/mL (ref 0.35–4.50)

## 2014-10-04 MED ORDER — VENLAFAXINE HCL ER 150 MG PO CP24: 150.0000 mg | ORAL_CAPSULE | Freq: Every day | ORAL | Status: DC

## 2014-10-04 NOTE — Progress Notes (Signed)
[name], female   DOB: 01/27/1940, 74 y.o.   MRN: [id number]   Subjective:    [name], female    DOB: 08/03/1940, 74 y.o.   MRN: [id number]  HPI  Patient with past history of hypertension, GERD, diabetes, hypothyroidism and breast cancer.  She comes in today to follow up on these issues as well as for a complete physical exam.  She has missed or cancelled multiple appointments. Did not bring any sugar readings.  States running low 100s.  No low blood sugars.  Breathing stable.  No cardiac symptoms with increased activity or exertion.  No acid reflux.  No abdominal pain or cramping.  Bowels stable.  Some mild depression at times, but overall feels better.  On effexor.  Wants to decrease back to 150mg  capsules.  Has been taking 150mg  now for a while.  Discussed diet and exercise.  Discussed weight loss.  Discussed colon cancer screening.    Past Medical History  Diagnosis Date  . Hypercholesterolemia   . Hypothyroidism   . Hypertension   . Asthma     questionable  . Breast cancer     s/p chemotherapy and XRT  . Depression   . Hx of vertigo   . Diabetes   . Polio 1948    history  . GERD (gastroesophageal reflux disease)    Past Surgical History  Procedure Laterality Date  . Tubal ligation     Family History  Problem Relation Age of Onset  . Cancer Mother     breast  . Stroke Father    Social History   Social History  . Marital Status: Married    Spouse Name: N/A  . Number of Children: N/A  . Years of Education: N/A   Social History Main Topics  . Smoking status: Never Smoker   . Smokeless tobacco: Never Used  . Alcohol Use: No  . Drug Use: No  . Sexual Activity: Not Asked   Other Topics Concern  . None   Social History Narrative    Outpatient Encounter Prescriptions as of 10/04/2014  Medication Sig  . atorvastatin (LIPITOR) 10 MG tablet TAKE ONE TABLET BY MOUTH ONCE DAILY  . BD ULTRA-FINE LANCETS lancets Test  blood glucose BID DX code 250.00  . glimepiride (AMARYL) 2 MG tablet TAKE ONE TABLET BY MOUTH TWICE DAILY  . glucose blood (ONE TOUCH ULTRA TEST) test strip Test blood sugar BID Dx Code:  250.00 Pt uses Contour Meter  . levothyroxine (SYNTHROID, LEVOTHROID) 137 MCG tablet TAKE ONE TABLET BY MOUTH ONCE DAILY BEFORE BREAKFAST  . LORazepam (ATIVAN) 1 MG tablet TAKE ONE TABLET BY MOUTH ONCE DAILY AS NEEDED FOR ANXIETY  . losartan (COZAAR) 100 MG tablet TAKE ONE TABLET BY MOUTH ONCE DAILY  . metFORMIN (GLUCOPHAGE) 1000 MG tablet Take 1 tablet (1,000 mg total) by mouth 2 (two) times daily with a meal.  . metoprolol succinate (TOPROL-XL) 25 MG 24 hr tablet TAKE ONE TABLET BY MOUTH TWICE DAILY  . pantoprazole (PROTONIX) 40 MG tablet TAKE ONE TABLET BY MOUTH TWICE DAILY  . [DISCONTINUED] venlafaxine (EFFEXOR) 75 MG tablet Take three per day  . venlafaxine XR (EFFEXOR XR) 150 MG 24 hr capsule Take 1 capsule (150 mg total) by mouth daily with breakfast.   No facility-administered encounter medications on file as of 10/04/2014.    Review of Systems  Constitutional: Negative for appetite change and unexpected weight change.  HENT: Negative for congestion and sinus  pressure.   Eyes: Negative for pain and visual disturbance.  Respiratory: Negative for cough, chest tightness and shortness of breath.   Cardiovascular: Negative for chest pain, palpitations and leg swelling.  Gastrointestinal: Negative for nausea, vomiting, abdominal pain and diarrhea.  Genitourinary: Negative for dysuria and difficulty urinating.  Musculoskeletal: Negative for back pain and joint swelling.  Skin: Negative for color change and rash.  Neurological: Negative for dizziness, light-headedness and headaches.  Psychiatric/Behavioral: Negative for dysphoric mood and agitation.       Objective:     Blood pressure recheck by me:  118/68  Physical Exam  Constitutional: She is oriented to person, place, and time. She appears  well-developed and well-nourished. No distress.  HENT:  Nose: Nose normal.  Mouth/Throat: Oropharynx is clear and moist.  Eyes: Right eye exhibits no discharge. Left eye exhibits no discharge. No scleral icterus.  Neck: Neck supple. No thyromegaly present.  Cardiovascular: Normal rate and regular rhythm.   Pulmonary/Chest: Breath sounds normal. No accessory muscle usage. No tachypnea. No respiratory distress. She has no decreased breath sounds. She has no wheezes. She has no rhonchi. Right breast exhibits no inverted nipple, no mass, no nipple discharge and no tenderness (no axillary adenopathy). Left breast exhibits no inverted nipple, no mass, no nipple discharge and no tenderness (no axilarry adenopathy).  Abdominal: Soft. Bowel sounds are normal. There is no tenderness.  Musculoskeletal: She exhibits no edema or tenderness.  Lymphadenopathy:    She has no cervical adenopathy.  Neurological: She is alert and oriented to person, place, and time.  Skin: Skin is warm. No rash noted. No erythema.  Psychiatric: She has a normal mood and affect. Her behavior is normal.    BP 100/70 mmHg  Pulse 87  Temp(Src) 98.3 F (36.8 C) (Oral)  Resp 18  Ht 5' 5.5" (1.664 m)  Wt 213 lb (96.616 kg)  BMI 34.89 kg/m2  SpO2 96% Wt Readings from Last 3 Encounters:  10/04/14 213 lb (96.616 kg)  04/12/14 211 lb 2 oz (95.766 kg)  09/18/13 214 lb 8 oz (97.297 kg)     Lab Results  Component Value Date   WBC 5.7 04/30/2014   HGB 13.3 04/30/2014   HCT 39.6 04/30/2014   PLT 220.0 04/30/2014   GLUCOSE 200* 10/04/2014   CHOL 162 10/04/2014   TRIG 157.0* 10/04/2014   HDL 46.70 10/04/2014   LDLDIRECT 109.9 11/01/2012   LDLCALC 84 10/04/2014   ALT 36* 10/04/2014   AST 44* 10/04/2014   NA 139 10/04/2014   K 5.0 10/04/2014   CL 104 10/04/2014   CREATININE 0.89 10/04/2014   BUN 14 10/04/2014   CO2 28 10/04/2014   TSH 1.19 10/04/2014   HGBA1C 7.4* 10/04/2014   MICROALBUR <0.7 10/04/2014         Assessment & Plan:   Problem List Items Addressed This Visit    Breast cancer    No longer followed at the cancer center.  She still has not had her mammogram.  We will reschedule today.  Discussed need for mammogram.       Diabetes    Brought in no recorded sugar readings.  Sugars as outlined.  States taking medication regularly.  Will check metabolic panel and P2R.  Discussed need for eye exam.  Overdue.  Diet and exercise.        Relevant Orders   Basic metabolic panel (Completed)   Hemoglobin A1c (Completed)   Microalbumin / creatinine urine ratio (Completed)   Essential hypertension,  benign - Primary    Blood pressure under good control.  Continue same medication regimen.  Follow pressures.  Follow metabolic panel.        GERD (gastroesophageal reflux disease)    On protonix.  No acid reflux symptoms.        Health care maintenance    Physical today 10/04/14.  Schedule mammogram.  Overdue.  Declines colonoscopy.  Discussed again with her today.       Hypercholesterolemia    On lipitor.  Low cholesterol diet and exercise.  Follow lipid panel and liver function tests.        Relevant Orders   Lipid panel (Completed)   Hepatic function panel (Completed)   Hypothyroidism    On thyroid replacement.  Follow tsh.       Relevant Orders   TSH (Completed)   Mild depression    On effexor.  Change to effexor XR 150mg  capsule q day.  Follow.  Does appear overall to be doing better.        Relevant Medications   venlafaxine XR (EFFEXOR XR) 150 MG 24 hr capsule    Other Visit Diagnoses    Encounter for immunization            Einar Pheasant, MD

## 2014-10-04 NOTE — Progress Notes (Signed)
Pre visit review using our clinic review tool, if applicable. No additional management support is needed unless otherwise documented below in the visit note. 

## 2014-10-05 ENCOUNTER — Encounter: Payer: Self-pay | Admitting: Internal Medicine

## 2014-10-05 DIAGNOSIS — Z0000 Encounter for general adult medical examination without abnormal findings: Secondary | ICD-10-CM | POA: Insufficient documentation

## 2014-10-05 NOTE — Assessment & Plan Note (Signed)
Blood pressure under good control.  Continue same medication regimen.  Follow pressures.  Follow metabolic panel.   

## 2014-10-05 NOTE — Assessment & Plan Note (Signed)
On protonix.  No acid reflux symptoms.

## 2014-10-05 NOTE — Assessment & Plan Note (Signed)
On effexor.  Change to effexor XR 150mg  capsule q day.  Follow.  Does appear overall to be doing better.

## 2014-10-05 NOTE — Assessment & Plan Note (Signed)
No longer followed at the cancer center.  She still has not had her mammogram.  We will reschedule today.  Discussed need for mammogram.

## 2014-10-05 NOTE — Assessment & Plan Note (Signed)
Brought in no recorded sugar readings.  Sugars as outlined.  States taking medication regularly.  Will check metabolic panel and V4H.  Discussed need for eye exam.  Overdue.  Diet and exercise.

## 2014-10-05 NOTE — Assessment & Plan Note (Signed)
On lipitor.  Low cholesterol diet and exercise.  Follow lipid panel and liver function tests.   

## 2014-10-05 NOTE — Assessment & Plan Note (Signed)
On thyroid replacement.  Follow tsh.  

## 2014-10-05 NOTE — Assessment & Plan Note (Signed)
Physical today 10/04/14.  Schedule mammogram.  Overdue.  Declines colonoscopy.  Discussed again with her today.

## 2014-10-06 ENCOUNTER — Other Ambulatory Visit: Payer: Self-pay | Admitting: Internal Medicine

## 2014-10-06 DIAGNOSIS — R7989 Other specified abnormal findings of blood chemistry: Secondary | ICD-10-CM

## 2014-10-06 DIAGNOSIS — R945 Abnormal results of liver function studies: Secondary | ICD-10-CM

## 2014-10-06 NOTE — Progress Notes (Signed)
Abdominal ultrasound ordered.

## 2014-10-11 ENCOUNTER — Ambulatory Visit: Payer: Medicare Other

## 2014-10-12 ENCOUNTER — Other Ambulatory Visit (INDEPENDENT_AMBULATORY_CARE_PROVIDER_SITE_OTHER): Payer: Medicare Other

## 2014-10-12 ENCOUNTER — Ambulatory Visit: Payer: Medicare Other | Attending: Internal Medicine

## 2014-10-12 DIAGNOSIS — R7989 Other specified abnormal findings of blood chemistry: Secondary | ICD-10-CM

## 2014-10-12 DIAGNOSIS — R945 Abnormal results of liver function studies: Secondary | ICD-10-CM

## 2014-10-12 LAB — HEPATIC FUNCTION PANEL
ALBUMIN: 4.1 g/dL (ref 3.5–5.2)
ALT: 33 U/L (ref 0–35)
AST: 39 U/L — AB (ref 0–37)
Alkaline Phosphatase: 60 U/L (ref 39–117)
Bilirubin, Direct: 0.1 mg/dL (ref 0.0–0.3)
TOTAL PROTEIN: 7.7 g/dL (ref 6.0–8.3)
Total Bilirubin: 0.6 mg/dL (ref 0.2–1.2)

## 2014-10-13 ENCOUNTER — Other Ambulatory Visit: Payer: Self-pay | Admitting: Internal Medicine

## 2014-10-15 ENCOUNTER — Ambulatory Visit
Admission: RE | Admit: 2014-10-15 | Discharge: 2014-10-15 | Disposition: A | Discharge status: Home / Self Care | Payer: Medicare Other | Source: Ambulatory Visit | Attending: Internal Medicine | Admitting: Internal Medicine

## 2014-10-15 ENCOUNTER — Encounter: Payer: Self-pay | Admitting: *Deleted

## 2014-10-15 DIAGNOSIS — K760 Fatty (change of) liver, not elsewhere classified: Secondary | ICD-10-CM | POA: Diagnosis not present

## 2014-10-15 DIAGNOSIS — R7989 Other specified abnormal findings of blood chemistry: Secondary | ICD-10-CM | POA: Diagnosis not present

## 2014-10-15 DIAGNOSIS — R945 Abnormal results of liver function studies: Secondary | ICD-10-CM

## 2014-10-16 ENCOUNTER — Encounter: Payer: Self-pay | Admitting: *Deleted

## 2014-11-05 ENCOUNTER — Other Ambulatory Visit: Payer: Self-pay | Admitting: Internal Medicine

## 2014-11-13 ENCOUNTER — Other Ambulatory Visit: Payer: Self-pay | Admitting: *Deleted

## 2014-11-13 ENCOUNTER — Other Ambulatory Visit: Payer: Self-pay | Admitting: Internal Medicine

## 2014-11-13 ENCOUNTER — Other Ambulatory Visit (INDEPENDENT_AMBULATORY_CARE_PROVIDER_SITE_OTHER): Payer: Medicare Other

## 2014-11-13 ENCOUNTER — Ambulatory Visit
Admission: RE | Admit: 2014-11-13 | Discharge: 2014-11-13 | Disposition: A | Discharge status: Home / Self Care | Payer: Medicare Other | Source: Ambulatory Visit | Attending: Internal Medicine | Admitting: Internal Medicine

## 2014-11-13 ENCOUNTER — Telehealth: Payer: Self-pay | Admitting: *Deleted

## 2014-11-13 DIAGNOSIS — C50919 Malignant neoplasm of unspecified site of unspecified female breast: Secondary | ICD-10-CM

## 2014-11-13 DIAGNOSIS — Z853 Personal history of malignant neoplasm of breast: Secondary | ICD-10-CM | POA: Insufficient documentation

## 2014-11-13 DIAGNOSIS — R7989 Other specified abnormal findings of blood chemistry: Secondary | ICD-10-CM

## 2014-11-13 DIAGNOSIS — R945 Abnormal results of liver function studies: Secondary | ICD-10-CM

## 2014-11-13 DIAGNOSIS — Z1231 Encounter for screening mammogram for malignant neoplasm of breast: Secondary | ICD-10-CM | POA: Insufficient documentation

## 2014-11-13 NOTE — Telephone Encounter (Signed)
Please advise refill? 

## 2014-11-13 NOTE — Telephone Encounter (Signed)
Order placed for f/u liver panel.  

## 2014-11-13 NOTE — Telephone Encounter (Signed)
Patient has requested a medication refill for lorazepam  1mg .

## 2014-11-13 NOTE — Telephone Encounter (Signed)
Labs and dx?  

## 2014-11-14 LAB — HEPATIC FUNCTION PANEL
ALBUMIN: 4.3 g/dL (ref 3.5–5.2)
ALT: 37 U/L — AB (ref 0–35)
AST: 43 U/L — AB (ref 0–37)
Alkaline Phosphatase: 71 U/L (ref 39–117)
Bilirubin, Direct: 0.1 mg/dL (ref 0.0–0.3)
Total Bilirubin: 0.6 mg/dL (ref 0.2–1.2)
Total Protein: 7.7 g/dL (ref 6.0–8.3)

## 2014-11-14 NOTE — Telephone Encounter (Signed)
This is the pt that came by the office.  I think you said this had been taken care of.  Let me know if I need to do anything.

## 2014-11-15 ENCOUNTER — Other Ambulatory Visit: Payer: Self-pay | Admitting: Internal Medicine

## 2014-11-15 ENCOUNTER — Other Ambulatory Visit: Payer: Medicare Other

## 2014-11-15 ENCOUNTER — Encounter: Payer: Self-pay | Admitting: *Deleted

## 2014-11-15 DIAGNOSIS — R7989 Other specified abnormal findings of blood chemistry: Secondary | ICD-10-CM

## 2014-11-15 DIAGNOSIS — R945 Abnormal results of liver function studies: Secondary | ICD-10-CM

## 2014-11-15 NOTE — Telephone Encounter (Signed)
This was faxed on 11/06/14. I called pharmacy & it has been filled days ago & waiting for her to pick up. Pt was notified in office.

## 2014-11-15 NOTE — Progress Notes (Signed)
Order placed for f/u liver panel.  

## 2014-12-07 ENCOUNTER — Other Ambulatory Visit: Payer: Self-pay | Admitting: Internal Medicine

## 2014-12-10 ENCOUNTER — Other Ambulatory Visit: Payer: Self-pay | Admitting: Internal Medicine

## 2014-12-10 NOTE — Telephone Encounter (Signed)
Please advise 

## 2014-12-10 NOTE — Telephone Encounter (Signed)
rx ok'd for levothyroxine #30 with 3 refills and lipitor #30 with 3 refills.  Also ok'd lorazepam #30 with one refill.  rx signed and placed in your box for lorazepam.

## 2014-12-11 ENCOUNTER — Other Ambulatory Visit: Payer: Self-pay | Admitting: Internal Medicine

## 2014-12-11 NOTE — Telephone Encounter (Signed)
Rx faxed to [facility]. [address]

## 2015-01-06 ENCOUNTER — Other Ambulatory Visit: Payer: Self-pay | Admitting: Internal Medicine

## 2015-02-08 ENCOUNTER — Other Ambulatory Visit: Payer: Self-pay | Admitting: Internal Medicine

## 2015-02-08 NOTE — Telephone Encounter (Signed)
Refilled lorazepam #30 with no refills.  Needs to keep her f/u appt already scheduled.

## 2015-02-08 NOTE — Telephone Encounter (Signed)
Pt last OV 10/04/14, last filled 12/10/14 #30tabs with 1refill. Please advise

## 2015-02-13 DIAGNOSIS — H2513 Age-related nuclear cataract, bilateral: Secondary | ICD-10-CM | POA: Diagnosis not present

## 2015-02-18 ENCOUNTER — Ambulatory Visit: Payer: Medicare Other | Admitting: Internal Medicine

## 2015-03-06 ENCOUNTER — Other Ambulatory Visit: Payer: Self-pay | Admitting: Internal Medicine

## 2015-03-13 DIAGNOSIS — H2513 Age-related nuclear cataract, bilateral: Secondary | ICD-10-CM | POA: Diagnosis not present

## 2015-04-07 ENCOUNTER — Other Ambulatory Visit: Payer: Self-pay | Admitting: Internal Medicine

## 2015-04-08 NOTE — Telephone Encounter (Signed)
Last seen in September. Appt in February was cancelled when you were out of office & one refill was given to patient. Pt never rescheduled appt.

## 2015-04-09 NOTE — Telephone Encounter (Signed)
Schedule an appt and then refill until appt.

## 2015-04-23 DIAGNOSIS — H5212 Myopia, left eye: Secondary | ICD-10-CM | POA: Diagnosis not present

## 2015-04-23 DIAGNOSIS — H2512 Age-related nuclear cataract, left eye: Secondary | ICD-10-CM | POA: Diagnosis not present

## 2015-04-29 ENCOUNTER — Other Ambulatory Visit: Payer: Self-pay | Admitting: Internal Medicine

## 2015-04-29 ENCOUNTER — Telehealth: Payer: Self-pay | Admitting: *Deleted

## 2015-04-29 DIAGNOSIS — E039 Hypothyroidism, unspecified: Secondary | ICD-10-CM

## 2015-04-29 DIAGNOSIS — E1140 Type 2 diabetes mellitus with diabetic neuropathy, unspecified: Secondary | ICD-10-CM

## 2015-04-29 DIAGNOSIS — E7800 Pure hypercholesterolemia, unspecified: Secondary | ICD-10-CM

## 2015-04-29 DIAGNOSIS — I10 Essential (primary) hypertension: Secondary | ICD-10-CM

## 2015-04-29 NOTE — Telephone Encounter (Signed)
Patient quested if she will need labs prior to her appt on 07/15/15 Please advise  Pt Contact [phone]

## 2015-04-30 NOTE — Telephone Encounter (Signed)
Refilled 03/07/15 and last seen in September of 2016 has a follow up 07/2015. Please advise?

## 2015-04-30 NOTE — Telephone Encounter (Signed)
I have placed the order for the labs.  She needs a non fasting lab appt.  I recommend scheduling within the next 2-3 weeks.  She is overdue labs.   Thanks

## 2015-04-30 NOTE — Telephone Encounter (Signed)
ok'd refill for lorazepam #30 with no refills.   

## 2015-04-30 NOTE — Telephone Encounter (Signed)
Faxed

## 2015-05-01 DIAGNOSIS — H2512 Age-related nuclear cataract, left eye: Secondary | ICD-10-CM | POA: Diagnosis not present

## 2015-05-01 DIAGNOSIS — H25812 Combined forms of age-related cataract, left eye: Secondary | ICD-10-CM | POA: Diagnosis not present

## 2015-05-08 DIAGNOSIS — H2511 Age-related nuclear cataract, right eye: Secondary | ICD-10-CM | POA: Diagnosis not present

## 2015-05-08 DIAGNOSIS — H25811 Combined forms of age-related cataract, right eye: Secondary | ICD-10-CM | POA: Diagnosis not present

## 2015-05-08 DIAGNOSIS — E119 Type 2 diabetes mellitus without complications: Secondary | ICD-10-CM | POA: Diagnosis not present

## 2015-05-20 ENCOUNTER — Other Ambulatory Visit: Payer: Medicare Other

## 2015-05-27 ENCOUNTER — Other Ambulatory Visit: Payer: Self-pay | Admitting: Internal Medicine

## 2015-05-31 ENCOUNTER — Emergency Department
Admission: EM | Admit: 2015-05-31 | Discharge: 2015-05-31 | Disposition: A | Discharge status: Home / Self Care | Payer: Medicare Other | Attending: Emergency Medicine | Admitting: Emergency Medicine

## 2015-05-31 ENCOUNTER — Emergency Department: Payer: Medicare Other

## 2015-05-31 ENCOUNTER — Encounter: Payer: Self-pay | Admitting: Emergency Medicine

## 2015-05-31 DIAGNOSIS — F329 Major depressive disorder, single episode, unspecified: Secondary | ICD-10-CM | POA: Insufficient documentation

## 2015-05-31 DIAGNOSIS — Z853 Personal history of malignant neoplasm of breast: Secondary | ICD-10-CM | POA: Insufficient documentation

## 2015-05-31 DIAGNOSIS — Z7984 Long term (current) use of oral hypoglycemic drugs: Secondary | ICD-10-CM | POA: Insufficient documentation

## 2015-05-31 DIAGNOSIS — R1012 Left upper quadrant pain: Secondary | ICD-10-CM | POA: Insufficient documentation

## 2015-05-31 DIAGNOSIS — Z79899 Other long term (current) drug therapy: Secondary | ICD-10-CM | POA: Diagnosis not present

## 2015-05-31 DIAGNOSIS — R1032 Left lower quadrant pain: Secondary | ICD-10-CM | POA: Diagnosis not present

## 2015-05-31 DIAGNOSIS — I10 Essential (primary) hypertension: Secondary | ICD-10-CM | POA: Insufficient documentation

## 2015-05-31 DIAGNOSIS — M7918 Myalgia, other site: Secondary | ICD-10-CM

## 2015-05-31 DIAGNOSIS — R109 Unspecified abdominal pain: Secondary | ICD-10-CM | POA: Diagnosis not present

## 2015-05-31 DIAGNOSIS — M791 Myalgia: Secondary | ICD-10-CM | POA: Diagnosis not present

## 2015-05-31 DIAGNOSIS — R1011 Right upper quadrant pain: Secondary | ICD-10-CM | POA: Insufficient documentation

## 2015-05-31 DIAGNOSIS — E119 Type 2 diabetes mellitus without complications: Secondary | ICD-10-CM | POA: Insufficient documentation

## 2015-05-31 DIAGNOSIS — E039 Hypothyroidism, unspecified: Secondary | ICD-10-CM | POA: Insufficient documentation

## 2015-05-31 LAB — BASIC METABOLIC PANEL
Anion gap: 8 (ref 5–15)
BUN: 18 mg/dL (ref 6–20)
CALCIUM: 9.3 mg/dL (ref 8.9–10.3)
CO2: 26 mmol/L (ref 22–32)
Chloride: 105 mmol/L (ref 101–111)
Creatinine, Ser: 1.2 mg/dL — ABNORMAL HIGH (ref 0.44–1.00)
GFR calc Af Amer: 50 mL/min — ABNORMAL LOW (ref >60)
GFR, EST NON AFRICAN AMERICAN: 43 mL/min — AB (ref >60)
GLUCOSE: 128 mg/dL — AB (ref 65–99)
Potassium: 4.4 mmol/L (ref 3.5–5.1)
Sodium: 139 mmol/L (ref 135–145)

## 2015-05-31 LAB — URINALYSIS COMPLETE WITH MICROSCOPIC (ARMC ONLY)
BILIRUBIN URINE: NEGATIVE
Glucose, UA: NEGATIVE mg/dL
KETONES UR: NEGATIVE mg/dL
Nitrite: NEGATIVE
PROTEIN: NEGATIVE mg/dL
Specific Gravity, Urine: 1.016 (ref 1.005–1.030)
pH: 5 (ref 5.0–8.0)

## 2015-05-31 LAB — CBC
HCT: 40.2 % (ref 35.0–47.0)
Hemoglobin: 13.6 g/dL (ref 12.0–16.0)
MCH: 33.2 pg (ref 26.0–34.0)
MCHC: 33.8 g/dL (ref 32.0–36.0)
MCV: 98.2 fL (ref 80.0–100.0)
PLATELETS: 206 10*3/uL (ref 150–440)
RBC: 4.09 MIL/uL (ref 3.80–5.20)
RDW: 13.5 % (ref 11.5–14.5)
WBC: 7.2 10*3/uL (ref 3.6–11.0)

## 2015-05-31 LAB — TROPONIN I

## 2015-05-31 MED ORDER — NITROFURANTOIN MONOHYD MACRO 100 MG PO CAPS: 100.0000 mg | ORAL_CAPSULE | Freq: Two times a day (BID) | ORAL | Status: AC

## 2015-05-31 MED ORDER — NAPROXEN 500 MG PO TABS: 500.0000 mg | ORAL_TABLET | Freq: Two times a day (BID) | ORAL | Status: DC

## 2015-05-31 NOTE — ED Notes (Signed)
approx x1 week , upper right flank/back pain that radiates around to upper abd and suprapubic region, no urinary symptoms or issues.

## 2015-05-31 NOTE — Discharge Instructions (Signed)
Muscle Pain, Adult  Muscle pain (myalgia) may be caused by many things, including:  · Overuse or muscle strain, especially if you are not in shape. This is the most common cause of muscle pain.  · Injury.  · Bruises.  · Viruses, such as the flu.  · Infectious diseases.  · Fibromyalgia, which is a chronic condition that causes muscle tenderness, fatigue, and headache.  · Autoimmune diseases, including lupus.  · Certain drugs, including ACE inhibitors and statins.  Muscle pain may be mild or severe. In most cases, the pain lasts only a short time and goes away without treatment. To diagnose the cause of your muscle pain, your health care provider will take your medical history. This means he or she will ask you when your muscle pain began and what has been happening. If you have not had muscle pain for very long, your health care provider may want to wait before doing much testing. If your muscle pain has lasted a long time, your health care provider may want to run tests right away. If your health care provider thinks your muscle pain may be caused by illness, you may need to have additional tests to rule out certain conditions.   Treatment for muscle pain depends on the cause. Home care is often enough to relieve muscle pain. Your health care provider may also prescribe anti-inflammatory medicine.  HOME CARE INSTRUCTIONS  Watch your condition for any changes. The following actions may help to lessen any discomfort you are feeling:  · Only take over-the-counter or prescription medicines as directed by your health care provider.  · Apply ice to the sore muscle:    Put ice in a plastic bag.    Place a towel between your skin and the bag.    Leave the ice on for 15-20 minutes, 3-4 times a day.  · You may alternate applying hot and cold packs to the muscle as directed by your health care provider.  · If overuse is causing your muscle pain, slow down your activities until the pain goes away.    Remember that it is normal  to feel some muscle pain after starting a workout program. Muscles that have not been used often will be sore at first.    Do regular, gentle exercises if you are not usually active.    Warm up before exercising to lower your risk of muscle pain.  · Do not continue working out if the pain is very bad. Bad pain could mean you have injured a muscle.  SEEK MEDICAL CARE IF:  · Your muscle pain gets worse, and medicines do not help.  · You have muscle pain that lasts longer than 3 days.  · You have a rash or fever along with muscle pain.  · You have muscle pain after a tick bite.  · You have muscle pain while working out, even though you are in good physical condition.  · You have redness, soreness, or swelling along with muscle pain.  · You have muscle pain after starting a new medicine or changing the dose of a medicine.  SEEK IMMEDIATE MEDICAL CARE IF:  · You have trouble breathing.  · You have trouble swallowing.  · You have muscle pain along with a stiff neck, fever, and vomiting.  · You have severe muscle weakness or cannot move part of your body.  MAKE SURE YOU:   · Understand these instructions.  · Will watch your condition.  · Will get   help right away if you are not doing well or get worse.     This information is not intended to replace advice given to you by your health care provider. Make sure you discuss any questions you have with your health care provider.     Document Released: 11/13/2005 Document Revised: 01/12/2014 Document Reviewed: 10/18/2012  Elsevier Interactive Patient Education ©2016 [employer]

## 2015-05-31 NOTE — ED Notes (Signed)
Patient states she fell 2 weeks ago on Right hip denied any pain or sxs at that time.  Reports 3 days later she had pain on her left side. Now c/o pain under right breast to pelvic region. Back pain also that goes all the way around middle back.  Patient is ambulatory with minimal assistance.

## 2015-05-31 NOTE — ED Provider Notes (Signed)
[facility] Emergency Department Provider Note  ____________________________________________    I have reviewed the triage vital signs and the nursing notes.   HISTORY  Chief Complaint Back Pain    HPI Joyce Maynard is a 75 y.o. female who presents with right flank pain. Patient reports over the last week she has had sharp pains in various places including in her left upper quadrant her right upper quadrant her right back her left lower quadrant, there does not appear to be any pattern to the pain. Currently she complains of pain primarily in the right mid back laterally. She denies fevers or chills. No nausea or vomiting. Normal stools. No history of the same. No injury to the area. No focal neuro deficits     Past Medical History  Diagnosis Date  . Hypercholesterolemia   . Hypothyroidism   . Hypertension   . Depression   . Hx of vertigo   . Diabetes ([place])   . Polio 1948    history  . GERD (gastroesophageal reflux disease)   . Breast cancer ([place]) 2005    s/p chemotherapy and XRT    Patient Active Problem List   Diagnosis Date Noted  . Health care maintenance 10/05/2014  . Mild depression 04/16/2014  . Other malaise and fatigue 09/20/2013  . Sore throat 09/20/2013  . Leg pain 01/18/2013  . Essential hypertension, benign 04/18/2012  . Diabetes ([place]) 04/18/2012  . Hypercholesterolemia 04/18/2012  . Breast cancer ([place]) 04/18/2012  . Hypothyroidism 04/18/2012  . GERD (gastroesophageal reflux disease) 04/18/2012    Past Surgical History  Procedure Laterality Date  . Tubal ligation    . Breast biopsy Left 2005    Current Outpatient Rx  Name  Route  Sig  Dispense  Refill  . atorvastatin (LIPITOR) 10 MG tablet      TAKE ONE TABLET BY MOUTH ONCE DAILY   30 tablet   5   . BD ULTRA-FINE LANCETS lancets      Test blood glucose BID DX code 250.00   200 each   1   . glimepiride (AMARYL) 2 MG tablet      TAKE ONE TABLET BY MOUTH  TWICE DAILY   60 tablet   0     Needs appt   . glucose blood (ONE TOUCH ULTRA TEST) test strip      Test blood sugar BID Dx Code:  250.00 Pt uses Contour Meter   200 each   1     Pt uses Contour Meter   . levothyroxine (SYNTHROID, LEVOTHROID) 137 MCG tablet      TAKE ONE TABLET BY MOUTH ONCE DAILY BEFORE BREAKFAST   30 tablet   5   . LORazepam (ATIVAN) 1 MG tablet      TAKE ONE TABLET BY MOUTH ONCE DAILY AS NEEDED FOR ANXIETY *PATIENT MUST BE SEEN FOR FURTHER REFILLS*   30 tablet   0     Needs appt   . losartan (COZAAR) 100 MG tablet      TAKE ONE TABLET BY MOUTH ONCE DAILY   30 tablet   5   . metFORMIN (GLUCOPHAGE) 1000 MG tablet      TAKE ONE TABLET BY MOUTH TWICE DAILY WITH A MEAL   60 tablet   0     Needs appt   . metoprolol succinate (TOPROL-XL) 25 MG 24 hr tablet      TAKE ONE TABLET BY MOUTH TWICE DAILY   60 tablet   5   .  pantoprazole (PROTONIX) 40 MG tablet   Oral   Take 1 tablet (40 mg total) by mouth 2 (two) times daily. MUST BE SEEN FOR FURTHER REFILLS   60 tablet   0   . venlafaxine XR (EFFEXOR-XR) 150 MG 24 hr capsule      TAKE ONE CAPSULE BY MOUTH ONCE DAILY WITH BREAKFAST   30 capsule   11     Allergies Review of patient's allergies indicates no known allergies.  Family History  Problem Relation Age of Onset  . Cancer Mother     breast  . Stroke Father   . Breast cancer Maternal Aunt     Social History Social History  Substance Use Topics  . Smoking status: Never Smoker   . Smokeless tobacco: Never Used  . Alcohol Use: No    Review of Systems  Constitutional: Negative for fever. Eyes: Negative for redness ENT: Negative for sore throat Cardiovascular: Negative for chest pain Respiratory: Negative for shortness of breath. Gastrointestinal: As above Genitourinary: Negative for dysuria. Musculoskeletal: Negative for back pain. Skin: Negative for rash. Neurological: Negative for focal weakness Psychiatric: no  anxiety    ____________________________________________   PHYSICAL EXAM:  VITAL SIGNS: ED Triage Vitals  Enc Vitals Group     BP 05/31/15 1457 175/84 mmHg     Pulse Rate 05/31/15 1457 82     Resp 05/31/15 1457 18     Temp 05/31/15 1457 98.5 F (36.9 C)     Temp Source 05/31/15 1457 Oral     SpO2 05/31/15 1457 100 %     Weight 05/31/15 1457 215 lb (97.523 kg)     Height 05/31/15 1457 5\' 6"  (1.676 m)     Head Cir --      Peak Flow --      Pain Score 05/31/15 1512 8     Pain Loc --      Pain Edu? --      Excl. in [place]? --      Constitutional: Alert and oriented. Well appearing and in no distress.  Eyes: Conjunctivae are normal. No erythema or injection ENT   Head: Normocephalic and atraumatic.   Mouth/Throat: Mucous membranes are moist. Cardiovascular: Normal rate, regular rhythm. Normal and symmetric distal pulses are present in the upper extremities. No murmurs or rubs  Respiratory: Normal respiratory effort without tachypnea nor retractions. Breath sounds are clear and equal bilaterally.  Gastrointestinal: Soft and non-tender in all quadrants. No distention. There is no CVA tenderness. Genitourinary: deferred Musculoskeletal: Nontender with normal range of motion in all extremities. No lower extremity tenderness nor edema. No vertebral tenderness to palpation. Mild tenderness to palpation right lateral back at approximately T8, most consistent with muscle tenderness/spasm. Neurologic:  Normal speech and language. No gross focal neurologic deficits are appreciated. Skin:  Skin is warm, dry and intact. No rash noted. Psychiatric: Mood and affect are normal. Patient exhibits appropriate insight and judgment.  ____________________________________________    LABS (pertinent positives/negatives)  Labs Reviewed  BASIC METABOLIC PANEL - Abnormal; Notable for the following:    Glucose, Bld 128 (*)    Creatinine, Ser 1.20 (*)    GFR calc non Af Amer 43 (*)    GFR calc  Af Amer 50 (*)    All other components within normal limits  CBC  TROPONIN I  URINALYSIS COMPLETEWITH MICROSCOPIC ([facility] ONLY)    ____________________________________________   EKG  ED ECG REPORT I, Lavonia Drafts, the attending physician, personally viewed and interpreted this ECG.  Date: 05/31/2015 EKG Time: 3:25 PM Rate: 68 Rhythm: normal sinus rhythm QRS Axis: normal Intervals: normal ST/T Wave abnormalities: normal Conduction Disturbances: Incomplete right bundle Narrative Interpretation: unremarkable   ____________________________________________    RADIOLOGY  CT  renal stone study normal  ____________________________________________   PROCEDURES  Procedure(s) performed: none  Critical Care performed: none  ____________________________________________   INITIAL IMPRESSION / ASSESSMENT AND PLAN / ED COURSE  Pertinent labs & imaging results that were available during my care of the patient were reviewed by me and considered in my medical decision making (see chart for details).  Patient with reports of intermittent short periods of pain diffusely throughout her abdomen. Currently her pain is primarily in the right back and appears to be primarily muscular skeletal.  CT scan is unremarkable, 2+ pulses equal in her upper and lower extremity. We will treat conservatively with NSAIDs and treat her possibly early urinary tract infection. She will follow-up with her PCP for further eval. She is to return to the emergency department if any change in her symptoms. ____________________________________________   FINAL CLINICAL IMPRESSION(S) / ED DIAGNOSES  Final diagnoses:  Flank pain, acute  Musculoskeletal pain          Lavonia Drafts, MD 05/31/15 [phone]

## 2015-05-31 NOTE — ED Notes (Signed)
Patient states she is unable to urinate at this time. Requested water, given per Dr. Corky Downs. Patient states she will call out when she needs to use the restroom.

## 2015-06-05 DIAGNOSIS — H524 Presbyopia: Secondary | ICD-10-CM | POA: Diagnosis not present

## 2015-07-11 ENCOUNTER — Other Ambulatory Visit: Payer: Self-pay | Admitting: Internal Medicine

## 2015-07-12 NOTE — Telephone Encounter (Signed)
faxed

## 2015-07-12 NOTE — Telephone Encounter (Signed)
Refilled 05/27/15 and patient last seen 09/2014. Appointment scheduled for 07/15/15. Other medication refilled dispensed 60 with no refills until patient comes to appointment. Please advise?

## 2015-07-12 NOTE — Telephone Encounter (Signed)
ok'd refill for lorazepam #30 with no refills.  She has to keep her 07/15/15 appt.

## 2015-07-15 ENCOUNTER — Ambulatory Visit: Payer: Medicare Other | Admitting: Internal Medicine

## 2015-07-15 ENCOUNTER — Telehealth: Payer: Self-pay | Admitting: Internal Medicine

## 2015-07-15 NOTE — Telephone Encounter (Signed)
The patient woke up late this morning not feeling well , vomiting did not want to come to this office visit . Would like another appointment with Dr. Nicki Reaper.

## 2015-08-09 ENCOUNTER — Other Ambulatory Visit: Payer: Self-pay | Admitting: Internal Medicine

## 2015-08-09 NOTE — Telephone Encounter (Signed)
Last fill 07/12/15. Pt has follow up visit scheduled 10/07/2015.

## 2015-08-22 ENCOUNTER — Telehealth: Payer: Self-pay | Admitting: Internal Medicine

## 2015-08-22 ENCOUNTER — Ambulatory Visit: Payer: Medicare Other | Admitting: Internal Medicine

## 2015-08-22 DIAGNOSIS — E039 Hypothyroidism, unspecified: Secondary | ICD-10-CM

## 2015-08-22 DIAGNOSIS — Z0289 Encounter for other administrative examinations: Secondary | ICD-10-CM

## 2015-08-22 NOTE — Telephone Encounter (Signed)
Hold on rescheduling.  She has scheduled and cancelled multiple appts.  I am not going to continue to refill her medication since she is continuing to no show for appts.  Please notify pt.

## 2015-08-22 NOTE — Telephone Encounter (Signed)
See attached

## 2015-08-22 NOTE — Telephone Encounter (Signed)
Please advise for no show and rescheduling, thanks

## 2015-08-22 NOTE — Telephone Encounter (Signed)
Pt left a msg about not being to make he appt today due to having vertigo issues this morning. Let me know if I need to cancel appt. Pt wants to resch no appt to sch let me know where to sch pt. Thank you!

## 2015-08-22 NOTE — Telephone Encounter (Signed)
Left message for patient to call back to inform patient that she will no longer refill her medication.  Patient must be seen prior to anymore refills.Joyce Maynard

## 2015-08-22 NOTE — Telephone Encounter (Signed)
Brock please call, thanks

## 2015-08-22 NOTE — Telephone Encounter (Signed)
Pt has cancelled her last 3 appt's with Dr. Nicki Reaper & keeps getting refills & not keeping appt. Last actual visit was September 2016

## 2015-08-23 NOTE — Telephone Encounter (Signed)
Left message for patient to return call back.  

## 2015-08-27 NOTE — Telephone Encounter (Signed)
Spoke to patient.  Patient has been notified.  Also  I have rescheduled her appointment for the [id number].  Is there anyway we can fit her in sooner? Please adviser.

## 2015-08-27 NOTE — Telephone Encounter (Signed)
There is an opening on 09/03/15 or can put her in at 12:00 or 4:00 on the days you do not have anyone scheduled.   She will have to keep this appt for any medication refills.

## 2015-09-10 ENCOUNTER — Other Ambulatory Visit: Payer: Self-pay | Admitting: Internal Medicine

## 2015-09-10 ENCOUNTER — Encounter (INDEPENDENT_AMBULATORY_CARE_PROVIDER_SITE_OTHER): Payer: Self-pay

## 2015-09-10 ENCOUNTER — Ambulatory Visit (INDEPENDENT_AMBULATORY_CARE_PROVIDER_SITE_OTHER): Payer: Medicare Other | Admitting: Internal Medicine

## 2015-09-10 ENCOUNTER — Encounter: Payer: Self-pay | Admitting: Internal Medicine

## 2015-09-10 VITALS — BP 122/78 | HR 96 | Temp 98.8°F | Resp 18 | Ht 65.5 in | Wt 208.5 lb

## 2015-09-10 DIAGNOSIS — E1140 Type 2 diabetes mellitus with diabetic neuropathy, unspecified: Secondary | ICD-10-CM | POA: Diagnosis not present

## 2015-09-10 DIAGNOSIS — E7800 Pure hypercholesterolemia, unspecified: Secondary | ICD-10-CM

## 2015-09-10 DIAGNOSIS — Z1239 Encounter for other screening for malignant neoplasm of breast: Secondary | ICD-10-CM | POA: Diagnosis not present

## 2015-09-10 DIAGNOSIS — R945 Abnormal results of liver function studies: Secondary | ICD-10-CM

## 2015-09-10 DIAGNOSIS — N63 Unspecified lump in breast: Secondary | ICD-10-CM | POA: Diagnosis not present

## 2015-09-10 DIAGNOSIS — Z1231 Encounter for screening mammogram for malignant neoplasm of breast: Secondary | ICD-10-CM | POA: Diagnosis not present

## 2015-09-10 DIAGNOSIS — K219 Gastro-esophageal reflux disease without esophagitis: Secondary | ICD-10-CM

## 2015-09-10 DIAGNOSIS — E039 Hypothyroidism, unspecified: Secondary | ICD-10-CM | POA: Diagnosis not present

## 2015-09-10 DIAGNOSIS — Z853 Personal history of malignant neoplasm of breast: Secondary | ICD-10-CM | POA: Diagnosis not present

## 2015-09-10 DIAGNOSIS — I10 Essential (primary) hypertension: Secondary | ICD-10-CM | POA: Diagnosis not present

## 2015-09-10 DIAGNOSIS — F329 Major depressive disorder, single episode, unspecified: Secondary | ICD-10-CM

## 2015-09-10 DIAGNOSIS — N6320 Unspecified lump in the left breast, unspecified quadrant: Secondary | ICD-10-CM

## 2015-09-10 DIAGNOSIS — F320 Major depressive disorder, single episode, mild: Secondary | ICD-10-CM

## 2015-09-10 DIAGNOSIS — F32A Depression, unspecified: Secondary | ICD-10-CM

## 2015-09-10 DIAGNOSIS — R7989 Other specified abnormal findings of blood chemistry: Secondary | ICD-10-CM

## 2015-09-10 LAB — LIPID PANEL
CHOL/HDL RATIO: 4
Cholesterol: 180 mg/dL (ref 0–200)
HDL: 46.6 mg/dL (ref >39.00)
LDL Cholesterol: 97 mg/dL (ref 0–99)
NONHDL: 133.07
TRIGLYCERIDES: 182 mg/dL — AB (ref 0.0–149.0)
VLDL: 36.4 mg/dL (ref 0.0–40.0)

## 2015-09-10 LAB — BASIC METABOLIC PANEL
BUN: 21 mg/dL (ref 6–23)
CHLORIDE: 104 meq/L (ref 96–112)
CO2: 26 meq/L (ref 19–32)
Calcium: 9.8 mg/dL (ref 8.4–10.5)
Creatinine, Ser: 1.27 mg/dL — ABNORMAL HIGH (ref 0.40–1.20)
GFR: 43.58 mL/min — ABNORMAL LOW (ref >60.00)
GLUCOSE: 162 mg/dL — AB (ref 70–99)
POTASSIUM: 4.7 meq/L (ref 3.5–5.1)
SODIUM: 139 meq/L (ref 135–145)

## 2015-09-10 LAB — HEMOGLOBIN A1C: Hgb A1c MFr Bld: 7.5 % — ABNORMAL HIGH (ref 4.6–6.5)

## 2015-09-10 LAB — CBC WITH DIFFERENTIAL/PLATELET
BASOS ABS: 0 10*3/uL (ref 0.0–0.1)
BASOS PCT: 0.5 % (ref 0.0–3.0)
Eosinophils Absolute: 0.1 10*3/uL (ref 0.0–0.7)
Eosinophils Relative: 2.2 % (ref 0.0–5.0)
HCT: 41.4 % (ref 36.0–46.0)
Hemoglobin: 14.2 g/dL (ref 12.0–15.0)
LYMPHS ABS: 1.8 10*3/uL (ref 0.7–4.0)
Lymphocytes Relative: 26.5 % (ref 12.0–46.0)
MCHC: 34.4 g/dL (ref 30.0–36.0)
MCV: 99.3 fl (ref 78.0–100.0)
MONOS PCT: 8.7 % (ref 3.0–12.0)
Monocytes Absolute: 0.6 10*3/uL (ref 0.1–1.0)
NEUTROS ABS: 4.1 10*3/uL (ref 1.4–7.7)
NEUTROS PCT: 62.1 % (ref 43.0–77.0)
PLATELETS: 210 10*3/uL (ref 150.0–400.0)
RBC: 4.17 Mil/uL (ref 3.87–5.11)
RDW: 14 % (ref 11.5–15.5)
WBC: 6.7 10*3/uL (ref 4.0–10.5)

## 2015-09-10 LAB — HEPATIC FUNCTION PANEL
ALK PHOS: 66 U/L (ref 39–117)
ALT: 31 U/L (ref 0–35)
AST: 34 U/L (ref 0–37)
Albumin: 4.3 g/dL (ref 3.5–5.2)
BILIRUBIN DIRECT: 0.1 mg/dL (ref 0.0–0.3)
BILIRUBIN TOTAL: 0.7 mg/dL (ref 0.2–1.2)
TOTAL PROTEIN: 8.1 g/dL (ref 6.0–8.3)

## 2015-09-10 LAB — TSH: TSH: 11.71 u[IU]/mL — ABNORMAL HIGH (ref 0.35–4.50)

## 2015-09-10 NOTE — Progress Notes (Signed)
Pre visit review using our clinic review tool, if applicable. No additional management support is needed unless otherwise documented below in the visit note. 

## 2015-09-10 NOTE — Progress Notes (Signed)
[name], female   DOB: [birth year], 75 y.o.   MRN: [id number]   Subjective:    [name], female    DOB: [birth year], 75 y.o.   MRN: [id number]  HPI  Patient here for a scheduled follow up.  She does not keep regular appts.  Has missed or cancelled most appts.  Have not seen her in almost one year.  She is eating.  States her sugars have been doing well.  Highest reading - 150.  No chest pain.  No sob.  No acid reflux.  No abdominal pain or cramping.  Had cataract surgery two months ago.  Has noticed lump in her left breast.  Noticed one week ago.  Persistent.  Intermittent pain.  States will notice some increased redness.     Past Medical History:  Diagnosis Date  . Breast cancer ([place]) 2005   s/p chemotherapy and XRT  . Depression   . Diabetes ([place])   . GERD (gastroesophageal reflux disease)   . Hx of vertigo   . Hypercholesterolemia   . Hypertension   . Hypothyroidism   . Polio 1948   history   Past Surgical History:  Procedure Laterality Date  . BREAST BIOPSY Left 2005  . TUBAL LIGATION     Family History  Problem Relation Age of Onset  . Cancer Mother     breast  . Stroke Father   . Breast cancer Maternal Aunt    Social History   Social History  . Marital status: Married    Spouse name: N/A  . Number of children: N/A  . Years of education: N/A   Social History Main Topics  . Smoking status: Never Smoker  . Smokeless tobacco: Never Used  . Alcohol use No  . Drug use: No  . Sexual activity: Not Asked   Other Topics Concern  . None   Social History Narrative  . None    Outpatient Encounter Prescriptions as of 09/10/2015  Medication Sig  . atorvastatin (LIPITOR) 10 MG tablet TAKE ONE TABLET BY MOUTH ONCE DAILY  . BD ULTRA-FINE LANCETS lancets Test blood glucose BID DX code 250.00  . glucose blood (ONE TOUCH ULTRA TEST) test strip Test blood sugar BID Dx Code:  250.00 Pt uses Contour Meter  . losartan (COZAAR)  100 MG tablet TAKE ONE TABLET BY MOUTH ONCE DAILY  . metoprolol succinate (TOPROL-XL) 25 MG 24 hr tablet TAKE ONE TABLET BY MOUTH TWICE DAILY  . naproxen (NAPROSYN) 500 MG tablet Take 1 tablet (500 mg total) by mouth 2 (two) times daily with a meal.  . venlafaxine XR (EFFEXOR-XR) 150 MG 24 hr capsule TAKE ONE CAPSULE BY MOUTH ONCE DAILY WITH BREAKFAST  . [DISCONTINUED] glimepiride (AMARYL) 2 MG tablet TAKE ONE TABLET BY MOUTH TWICE DAILY  . [DISCONTINUED] levothyroxine (SYNTHROID, LEVOTHROID) 137 MCG tablet TAKE ONE TABLET BY MOUTH ONCE DAILY BEFORE BREAKFAST  . [DISCONTINUED] LORazepam (ATIVAN) 1 MG tablet TAKE ONE TABLET BY MOUTH ONCE DAILY AS NEEDED FOR ANXIETY. MUST BE SEEN FOR FURTHER REFILLS.  . [DISCONTINUED] metFORMIN (GLUCOPHAGE) 1000 MG tablet TAKE ONE TABLET BY MOUTH TWICE DAILY WITH A MEAL  . [DISCONTINUED] pantoprazole (PROTONIX) 40 MG tablet TAKE ONE TABLET BY MOUTH TWICE DAILY. MUST BE SEEN FOR FURTHER REFILLS.   No facility-administered encounter medications on file as of 09/10/2015.     Review of Systems  Constitutional: Negative for appetite change and unexpected weight change.  HENT: Negative for congestion and sinus  pressure.   Respiratory: Negative for cough, chest tightness and shortness of breath.   Cardiovascular: Negative for chest pain, palpitations and leg swelling.       Reports increased fullness left breast as outlined.   Gastrointestinal: Negative for abdominal pain, diarrhea, nausea and vomiting.  Genitourinary: Negative for difficulty urinating and dysuria.  Musculoskeletal: Negative for joint swelling and myalgias.  Skin: Negative for color change and rash.  Neurological: Negative for dizziness, light-headedness and headaches.  Psychiatric/Behavioral: Negative for agitation and dysphoric mood.       Objective:    Physical Exam  Constitutional: She appears well-developed and well-nourished. No distress.  HENT:  Nose: Nose normal.  Mouth/Throat:  Oropharynx is clear and moist.  Neck: Neck supple. No thyromegaly present.  Cardiovascular: Normal rate and regular rhythm.   Pulmonary/Chest: Breath sounds normal. No respiratory distress. She has no wheezes.  Breast exam - no nipple discharge or nipptle retraction present.  Fullness left lateral breast.  No increased erythema.  No increased warmth.    Abdominal: Soft. Bowel sounds are normal. There is no tenderness.  Musculoskeletal: She exhibits no edema or tenderness.  Lymphadenopathy:    She has no cervical adenopathy.  Skin: No rash noted. No erythema.  Psychiatric: She has a normal mood and affect. Her behavior is normal.    BP 122/78   Pulse 96   Temp 98.8 F (37.1 C) (Oral)   Resp 18   Ht 5' 5.5" (1.664 m)   Wt 208 lb 8 oz (94.6 kg)   SpO2 95%   BMI 34.17 kg/m  Wt Readings from Last 3 Encounters:  09/10/15 208 lb 8 oz (94.6 kg)  05/31/15 215 lb (97.5 kg)  10/04/14 213 lb (96.6 kg)     Lab Results  Component Value Date   WBC 6.7 09/10/2015   HGB 14.2 09/10/2015   HCT 41.4 09/10/2015   PLT 210.0 09/10/2015   GLUCOSE 162 (H) 09/10/2015   CHOL 180 09/10/2015   TRIG 182.0 (H) 09/10/2015   HDL 46.60 09/10/2015   LDLDIRECT 109.9 11/01/2012   LDLCALC 97 09/10/2015   ALT 31 09/10/2015   AST 34 09/10/2015   NA 139 09/10/2015   K 4.7 09/10/2015   CL 104 09/10/2015   CREATININE 1.27 (H) 09/10/2015   BUN 21 09/10/2015   CO2 26 09/10/2015   TSH 11.71 (H) 09/10/2015   HGBA1C 7.5 (H) 09/10/2015   MICROALBUR <0.7 10/04/2014    Ct Renal Stone Study  Result Date: 05/31/2015 CLINICAL DATA:  Right-sided flank pain for 1 week, initial encounter EXAM: CT ABDOMEN AND PELVIS WITHOUT CONTRAST TECHNIQUE: Multidetector CT imaging of the abdomen and pelvis was performed following the standard protocol without IV contrast. COMPARISON:  None. FINDINGS: Lung bases are free of acute infiltrate or sizable effusion. The liver, gallbladder, spleen, adrenal glands and pancreas are within  normal limits. The kidneys show no evidence of renal calculi or obstructive changes. The ureters are well visualized bilaterally. The bladder is decompressed. No pelvic mass lesion is noted. The uterus and ovaries appear within normal limits. The appendix is unremarkable. No diverticular change is seen. No pelvic mass lesion is noted. The osseous structures show degenerative change of the lumbar spine. Chronic compression deformity at L1 is seen. IMPRESSION: No acute abnormality is noted. Electronically Signed   By: Inez Catalina M.D.   On: 05/31/2015 18:09       Assessment & Plan:   Problem List Items Addressed This Visit    Diabetes (  [place])    Brought in no recorded sugar readings.  Discussed with her today.  Discussed diet and exercise.  Discussed importance of regular f/u.  Low carb diet and exercise.  Follow met b and a1c.        Essential hypertension, benign    Blood pressure has been under god control.  Follow pressures.  Continue same medication regiment.  Follow.       GERD (gastroesophageal reflux disease)    Symptoms controlled.  On protonix.       History of breast cancer    Is no longer followed at the cancer.  Breast fullness as outlined.  Check diagnostic mammogram and ultrasound.        Relevant Orders   MM Digital Diagnostic Bilat   US BREAST LTD UNI LEFT INC AXILLA   US BREAST LTD UNI RIGHT INC AXILLA   Hypercholesterolemia    On lipitor.  Low cholesterol diet and exercise.  Follow lipid panel and liver function tests.        Hypothyroidism    On thyroid replacement.  Follow tsh.       Left breast lump - Primary    Has a history of breast cancer.  Fullness as outlined.  Diagnostic mammogram and ultrasound ordered.        Relevant Orders   MM Digital Diagnostic Bilat   US BREAST LTD UNI LEFT INC AXILLA   US BREAST LTD UNI RIGHT INC AXILLA   Mild depression    On effexor.  Takes lorazepam.  Follow.         Other Visit Diagnoses    Screening breast  examination       Breast cancer screening, high risk patient       Relevant Orders   MM Digital Screening   Abnormal liver function tests           Einar Pheasant, MD

## 2015-09-11 ENCOUNTER — Encounter: Payer: Self-pay | Admitting: Internal Medicine

## 2015-09-11 ENCOUNTER — Other Ambulatory Visit: Payer: Self-pay | Admitting: Internal Medicine

## 2015-09-11 DIAGNOSIS — Z79899 Other long term (current) drug therapy: Secondary | ICD-10-CM | POA: Diagnosis not present

## 2015-09-11 DIAGNOSIS — R7989 Other specified abnormal findings of blood chemistry: Secondary | ICD-10-CM

## 2015-09-11 NOTE — Telephone Encounter (Signed)
ok'd lorazepam #30 with one refill.   

## 2015-09-11 NOTE — Telephone Encounter (Signed)
Seen 09/10/15

## 2015-09-11 NOTE — Telephone Encounter (Signed)
Rx faxed to [facility].

## 2015-09-12 MED ORDER — LEVOTHYROXINE SODIUM 150 MCG PO TABS: 150.0000 ug | ORAL_TABLET | Freq: Every day | ORAL | 3 refills | Status: DC

## 2015-09-12 NOTE — Telephone Encounter (Signed)
-----   Message from Einar Pheasant, MD sent at 09/11/2015  5:36 AM EDT ----- Please call and notify pt that her cholesterol is ok.  Triglycerides have increased slightly when compared to previous check.  A1c has increased as well.  a1c 7.5.  Low carb diet and exercise.  Check and record blood sugars and send in over the next 2-3 weeks.  Kidney function has decreased (creatinine has increased).  Avoid antiinflammatories.  Stay hydrated.  Need to recheck kidney function within the next 7-10 days.  Her tsh is also elevated.  Is she taking her synthroid 153mcg q day.  If not, then needs to start.  If taking, then needs to increased synthroid to 18mcg q day.  Recheck tsh in 6 weeks.  hgb and liver function tests wnl.

## 2015-09-13 ENCOUNTER — Encounter: Payer: Self-pay | Admitting: Internal Medicine

## 2015-09-14 ENCOUNTER — Encounter: Payer: Self-pay | Admitting: Internal Medicine

## 2015-09-14 NOTE — Assessment & Plan Note (Signed)
Has a history of breast cancer.  Fullness as outlined.  Diagnostic mammogram and ultrasound ordered.

## 2015-09-14 NOTE — Assessment & Plan Note (Signed)
Brought in no recorded sugar readings.  Discussed with her today.  Discussed diet and exercise.  Discussed importance of regular f/u.  Low carb diet and exercise.  Follow met b and a1c.

## 2015-09-14 NOTE — Assessment & Plan Note (Signed)
Is no longer followed at the cancer.  Breast fullness as outlined.  Check diagnostic mammogram and ultrasound.

## 2015-09-14 NOTE — Assessment & Plan Note (Signed)
On thyroid replacement.  Follow tsh.  

## 2015-09-14 NOTE — Assessment & Plan Note (Signed)
Symptoms controlled.  On protonix.   

## 2015-09-14 NOTE — Assessment & Plan Note (Signed)
On effexor.  Takes lorazepam.  Follow.

## 2015-09-14 NOTE — Assessment & Plan Note (Signed)
Blood pressure has been under god control.  Follow pressures.  Continue same medication regiment.  Follow.

## 2015-09-14 NOTE — Assessment & Plan Note (Signed)
On lipitor.  Low cholesterol diet and exercise.  Follow lipid panel and liver function tests.   

## 2015-09-20 ENCOUNTER — Telehealth: Payer: Self-pay | Admitting: *Deleted

## 2015-09-20 ENCOUNTER — Other Ambulatory Visit (INDEPENDENT_AMBULATORY_CARE_PROVIDER_SITE_OTHER): Payer: Medicare Other

## 2015-09-20 DIAGNOSIS — R748 Abnormal levels of other serum enzymes: Secondary | ICD-10-CM | POA: Diagnosis not present

## 2015-09-20 DIAGNOSIS — R7989 Other specified abnormal findings of blood chemistry: Secondary | ICD-10-CM

## 2015-09-20 DIAGNOSIS — N289 Disorder of kidney and ureter, unspecified: Secondary | ICD-10-CM

## 2015-09-23 ENCOUNTER — Other Ambulatory Visit (INDEPENDENT_AMBULATORY_CARE_PROVIDER_SITE_OTHER): Payer: Medicare Other

## 2015-09-23 DIAGNOSIS — R748 Abnormal levels of other serum enzymes: Secondary | ICD-10-CM | POA: Diagnosis not present

## 2015-09-23 DIAGNOSIS — R7989 Other specified abnormal findings of blood chemistry: Secondary | ICD-10-CM

## 2015-09-23 DIAGNOSIS — E039 Hypothyroidism, unspecified: Secondary | ICD-10-CM

## 2015-09-23 LAB — BASIC METABOLIC PANEL
BUN: 18 mg/dL (ref 6–23)
CALCIUM: 8.7 mg/dL (ref 8.4–10.5)
CO2: 25 mEq/L (ref 19–32)
CREATININE: 1.14 mg/dL (ref 0.40–1.20)
Chloride: 107 mEq/L (ref 96–112)
GFR: 49.36 mL/min — AB (ref >60.00)
GLUCOSE: 240 mg/dL — AB (ref 70–99)
Potassium: 4.9 mEq/L (ref 3.5–5.1)
Sodium: 139 mEq/L (ref 135–145)

## 2015-09-23 LAB — URINALYSIS, ROUTINE W REFLEX MICROSCOPIC
Bilirubin Urine: NEGATIVE
KETONES UR: NEGATIVE
NITRITE: NEGATIVE
PH: 5.5 (ref 5.0–8.0)
SPECIFIC GRAVITY, URINE: 1.025 (ref 1.000–1.030)
Total Protein, Urine: NEGATIVE
URINE GLUCOSE: 100 — AB
Urobilinogen, UA: 0.2 (ref 0.0–1.0)

## 2015-09-23 NOTE — Telephone Encounter (Signed)
error 

## 2015-09-26 ENCOUNTER — Other Ambulatory Visit: Payer: Medicare Other

## 2015-10-07 ENCOUNTER — Ambulatory Visit: Payer: Medicare Other | Admitting: Internal Medicine

## 2015-10-07 ENCOUNTER — Ambulatory Visit: Payer: Medicare Other

## 2015-10-08 ENCOUNTER — Ambulatory Visit
Admission: RE | Admit: 2015-10-08 | Discharge: 2015-10-08 | Disposition: A | Discharge status: Home / Self Care | Payer: Medicare Other | Source: Ambulatory Visit | Attending: Internal Medicine | Admitting: Internal Medicine

## 2015-10-08 ENCOUNTER — Other Ambulatory Visit: Payer: Self-pay | Admitting: Internal Medicine

## 2015-10-08 DIAGNOSIS — Z853 Personal history of malignant neoplasm of breast: Secondary | ICD-10-CM

## 2015-10-08 DIAGNOSIS — N6320 Unspecified lump in the left breast, unspecified quadrant: Secondary | ICD-10-CM

## 2015-10-08 DIAGNOSIS — N6321 Unspecified lump in the left breast, upper outer quadrant: Secondary | ICD-10-CM | POA: Diagnosis not present

## 2015-10-08 DIAGNOSIS — R928 Other abnormal and inconclusive findings on diagnostic imaging of breast: Secondary | ICD-10-CM

## 2015-10-09 ENCOUNTER — Other Ambulatory Visit: Payer: Self-pay | Admitting: Internal Medicine

## 2015-10-09 DIAGNOSIS — R928 Other abnormal and inconclusive findings on diagnostic imaging of breast: Secondary | ICD-10-CM

## 2015-10-09 DIAGNOSIS — Z853 Personal history of malignant neoplasm of breast: Secondary | ICD-10-CM

## 2015-10-09 NOTE — Progress Notes (Signed)
Order placed for surgery referral.  

## 2015-10-15 DIAGNOSIS — Z853 Personal history of malignant neoplasm of breast: Secondary | ICD-10-CM | POA: Diagnosis not present

## 2015-10-15 DIAGNOSIS — N63 Unspecified lump in unspecified breast: Secondary | ICD-10-CM | POA: Diagnosis not present

## 2015-10-16 ENCOUNTER — Other Ambulatory Visit: Payer: Self-pay | Admitting: Surgery

## 2015-10-16 DIAGNOSIS — N6320 Unspecified lump in the left breast, unspecified quadrant: Secondary | ICD-10-CM

## 2015-10-16 DIAGNOSIS — N6321 Unspecified lump in the left breast, upper outer quadrant: Secondary | ICD-10-CM | POA: Diagnosis not present

## 2015-10-16 DIAGNOSIS — R928 Other abnormal and inconclusive findings on diagnostic imaging of breast: Secondary | ICD-10-CM

## 2015-10-18 ENCOUNTER — Telehealth: Payer: Self-pay | Admitting: *Deleted

## 2015-10-18 DIAGNOSIS — E039 Hypothyroidism, unspecified: Secondary | ICD-10-CM

## 2015-10-18 NOTE — Telephone Encounter (Signed)
Lab orders placed.  

## 2015-10-19 ENCOUNTER — Other Ambulatory Visit: Payer: Self-pay | Admitting: Internal Medicine

## 2015-10-22 ENCOUNTER — Telehealth: Payer: Self-pay | Admitting: Internal Medicine

## 2015-10-22 NOTE — Telephone Encounter (Signed)
Pt is going this Friday for a biopsy. She wanted to know if she should still come this Thursday for a lab appt. Please let her know.

## 2015-10-22 NOTE — Telephone Encounter (Signed)
Please advise 

## 2015-10-23 NOTE — Telephone Encounter (Signed)
Patient notified through vm

## 2015-10-23 NOTE — Telephone Encounter (Signed)
Yes, I would like for her to come in for the lab if possible.

## 2015-10-24 ENCOUNTER — Other Ambulatory Visit: Payer: Medicare Other

## 2015-10-25 ENCOUNTER — Other Ambulatory Visit (INDEPENDENT_AMBULATORY_CARE_PROVIDER_SITE_OTHER): Payer: Medicare Other

## 2015-10-25 ENCOUNTER — Ambulatory Visit
Admission: RE | Admit: 2015-10-25 | Discharge: 2015-10-25 | Disposition: A | Discharge status: Home / Self Care | Payer: Medicare Other | Source: Ambulatory Visit | Attending: Surgery | Admitting: Surgery

## 2015-10-25 DIAGNOSIS — E039 Hypothyroidism, unspecified: Secondary | ICD-10-CM

## 2015-10-25 DIAGNOSIS — N6321 Unspecified lump in the left breast, upper outer quadrant: Secondary | ICD-10-CM | POA: Insufficient documentation

## 2015-10-25 DIAGNOSIS — N641 Fat necrosis of breast: Secondary | ICD-10-CM | POA: Insufficient documentation

## 2015-10-25 DIAGNOSIS — N6320 Unspecified lump in the left breast, unspecified quadrant: Secondary | ICD-10-CM

## 2015-10-25 DIAGNOSIS — R928 Other abnormal and inconclusive findings on diagnostic imaging of breast: Secondary | ICD-10-CM

## 2015-10-25 HISTORY — PX: BREAST BIOPSY: SHX20

## 2015-10-25 LAB — TSH: TSH: 8.48 u[IU]/mL — AB (ref 0.35–4.50)

## 2015-10-26 ENCOUNTER — Encounter: Payer: Self-pay | Admitting: Internal Medicine

## 2015-10-28 LAB — SURGICAL PATHOLOGY

## 2015-11-05 ENCOUNTER — Telehealth: Payer: Self-pay | Admitting: Internal Medicine

## 2015-11-05 NOTE — Telephone Encounter (Signed)
I called pt and left a vm to call office to sch AWV. Thank you! °

## 2015-11-06 ENCOUNTER — Ambulatory Visit: Payer: Medicare Other | Admitting: Internal Medicine

## 2015-11-06 DIAGNOSIS — Z0289 Encounter for other administrative examinations: Secondary | ICD-10-CM

## 2015-11-12 DIAGNOSIS — Z853 Personal history of malignant neoplasm of breast: Secondary | ICD-10-CM | POA: Diagnosis not present

## 2015-11-12 DIAGNOSIS — N63 Unspecified lump in unspecified breast: Secondary | ICD-10-CM | POA: Diagnosis not present

## 2015-11-15 ENCOUNTER — Encounter
Admission: RE | Admit: 2015-11-15 | Discharge: 2015-11-15 | Disposition: A | Discharge status: Home / Self Care | Payer: Medicare Other | Source: Ambulatory Visit | Attending: Surgery | Admitting: Surgery

## 2015-11-15 DIAGNOSIS — Z01818 Encounter for other preprocedural examination: Secondary | ICD-10-CM | POA: Insufficient documentation

## 2015-11-15 DIAGNOSIS — N6320 Unspecified lump in the left breast, unspecified quadrant: Secondary | ICD-10-CM | POA: Insufficient documentation

## 2015-11-15 HISTORY — DX: Other specified postprocedural states: Z98.890

## 2015-11-15 HISTORY — DX: Nausea with vomiting, unspecified: R11.2

## 2015-11-15 NOTE — Patient Instructions (Signed)
  Your procedure is scheduled on: Tues.  11/19/15 Report to Day Surgery. To find out your arrival time please call [phone] between 1PM - 3PM on Mon. 11/18/15.  Remember: Instructions that are not followed completely may result in serious medical risk, up to and including death, or upon the discretion of your surgeon and anesthesiologist your surgery may need to be rescheduled.    __x__ 1. Do not eat food or drink liquids after midnight. No gum chewing or hard candies.     __x__ 2. No Alcohol for 24 hours before or after surgery.   ____ 3. Do Not Smoke For 24 Hours Prior to Your Surgery.   ____ 4. Bring all medications with you on the day of surgery if instructed.    ___x_ 5. Notify your doctor if there is any change in your medical condition     (cold, fever, infections).       Do not wear jewelry, make-up, hairpins, clips or nail polish.  Do not wear lotions, powders, or perfumes. You may wear deodorant.  Do not shave 48 hours prior to surgery. Men may shave face and neck.  Do not bring valuables to the hospital.    [facility] is not responsible for any belongings or valuables.               Contacts, dentures or bridgework may not be worn into surgery.  Leave your suitcase in the car. After surgery it may be brought to your room.  For patients admitted to the hospital, discharge time is determined by your                treatment team.   Patients discharged the day of surgery will not be allowed to drive home.   Please read over the following fact sheets that you were given:      _x___ Take these medicines the morning of surgery with A SIP OF WATER:    1. acetaminophen (TYLENOL) 500 MG tablet if needed  2. atorvastatin (LIPITOR) 10 MG tablet  3. levothyroxine (SYNTHROID, LEVOTHROID) 150 MCG tablet  4.LORazepam (ATIVAN) 1 MG tablet if needed  5.losartan (COZAAR) 100 MG tablet  30metoprolol succinate (TOPROL-XL) 25 MG 24 hr tablet.               pantoprazole (PROTONIX)  40 MG tablet                venlafaxine XR (EFFEXOR-XR) 150 MG 24 hr capsule  ____ Fleet Enema (as directed)   __x__ Use CHG Soap as directed  ____ Use inhalers on the day of surgery  __x__ Stop metformin 2 days prior to surgery Saturday last dose    ____ Take 1/2 of usual insulin dose the night before surgery and none on the morning of surgery.   _x__ Stopped aspirin already on  11/11/15  __x__ Stop Anti-inflammatories Aleve or advil or ibuprofen today.  May take tylenol   _x___ Stop supplements until after surgery.  Vitamin C  ____ Bring C-Pap to the hospital.

## 2015-11-19 ENCOUNTER — Ambulatory Visit: Payer: Medicare Other | Admitting: Anesthesiology

## 2015-11-19 ENCOUNTER — Encounter
Admission: RE | Disposition: A | Discharge status: Home / Self Care | Payer: Self-pay | Source: Ambulatory Visit | Attending: Surgery

## 2015-11-19 ENCOUNTER — Ambulatory Visit
Admission: RE | Admit: 2015-11-19 | Discharge: 2015-11-19 | Disposition: A | Discharge status: Home / Self Care | Payer: Medicare Other | Source: Ambulatory Visit | Attending: Surgery | Admitting: Surgery

## 2015-11-19 DIAGNOSIS — N6321 Unspecified lump in the left breast, upper outer quadrant: Secondary | ICD-10-CM | POA: Diagnosis not present

## 2015-11-19 DIAGNOSIS — N641 Fat necrosis of breast: Secondary | ICD-10-CM | POA: Diagnosis not present

## 2015-11-19 DIAGNOSIS — I10 Essential (primary) hypertension: Secondary | ICD-10-CM | POA: Diagnosis not present

## 2015-11-19 DIAGNOSIS — K219 Gastro-esophageal reflux disease without esophagitis: Secondary | ICD-10-CM | POA: Diagnosis not present

## 2015-11-19 DIAGNOSIS — E039 Hypothyroidism, unspecified: Secondary | ICD-10-CM | POA: Diagnosis not present

## 2015-11-19 DIAGNOSIS — F329 Major depressive disorder, single episode, unspecified: Secondary | ICD-10-CM | POA: Diagnosis not present

## 2015-11-19 DIAGNOSIS — E119 Type 2 diabetes mellitus without complications: Secondary | ICD-10-CM | POA: Diagnosis not present

## 2015-11-19 DIAGNOSIS — N6320 Unspecified lump in the left breast, unspecified quadrant: Secondary | ICD-10-CM | POA: Diagnosis not present

## 2015-11-19 DIAGNOSIS — N63 Unspecified lump in unspecified breast: Secondary | ICD-10-CM | POA: Diagnosis not present

## 2015-11-19 HISTORY — PX: EXCISION OF BREAST LESION: SHX6676

## 2015-11-19 LAB — POCT I-STAT 4, (NA,K, GLUC, HGB,HCT)
GLUCOSE: 203 mg/dL — AB (ref 65–99)
HEMATOCRIT: 41 % (ref 36.0–46.0)
Hemoglobin: 13.9 g/dL (ref 12.0–15.0)
Potassium: 3.9 mmol/L (ref 3.5–5.1)
SODIUM: 141 mmol/L (ref 135–145)

## 2015-11-19 LAB — GLUCOSE, CAPILLARY: Glucose-Capillary: 187 mg/dL — ABNORMAL HIGH (ref 65–99)

## 2015-11-19 SURGERY — EXCISION OF BREAST LESION
Anesthesia: General | Laterality: Left

## 2015-11-19 MED ORDER — HYDROCODONE-ACETAMINOPHEN 5-325 MG PO TABS: 1.0000 | ORAL_TABLET | ORAL | Status: DC | PRN

## 2015-11-19 MED ORDER — LACTATED RINGERS IV SOLN: INTRAVENOUS | Status: DC | PRN

## 2015-11-19 MED ORDER — OXYCODONE HCL 5 MG PO TABS
ORAL_TABLET | ORAL | Status: DC
Start: 2015-11-19 — End: 2015-11-19
  Filled 2015-11-19: qty 1

## 2015-11-19 MED ORDER — OXYCODONE HCL 5 MG/5ML PO SOLN: 5.0000 mg | Freq: Once | ORAL | Status: AC | PRN

## 2015-11-19 MED ORDER — ONDANSETRON HCL 4 MG/2ML IJ SOLN
INTRAMUSCULAR | Status: DC | PRN
  Administered 2015-11-19: 4 mg via INTRAVENOUS

## 2015-11-19 MED ORDER — BUPIVACAINE-EPINEPHRINE 0.5% -1:200000 IJ SOLN
INTRAMUSCULAR | Status: DC | PRN
  Administered 2015-11-19: 7 mL

## 2015-11-19 MED ORDER — SODIUM CHLORIDE 0.9 % IV SOLN
INTRAVENOUS | Status: DC
  Administered 2015-11-19: 10:00:00 via INTRAVENOUS

## 2015-11-19 MED ORDER — HYDROCODONE-ACETAMINOPHEN 5-325 MG PO TABS: 1.0000 | ORAL_TABLET | ORAL | 0 refills | Status: DC | PRN

## 2015-11-19 MED ORDER — FENTANYL CITRATE (PF) 100 MCG/2ML IJ SOLN
INTRAMUSCULAR | Status: DC | PRN
  Administered 2015-11-19: 25 ug via INTRAVENOUS

## 2015-11-19 MED ORDER — SODIUM CHLORIDE 0.9 % IV SOLN
INTRAVENOUS | Status: DC | PRN
  Administered 2015-11-19: 50 ug/min via INTRAVENOUS

## 2015-11-19 MED ORDER — LIDOCAINE HCL (CARDIAC) 20 MG/ML IV SOLN
INTRAVENOUS | Status: DC | PRN
  Administered 2015-11-19: 100 mg via INTRAVENOUS

## 2015-11-19 MED ORDER — BUPIVACAINE-EPINEPHRINE (PF) 0.5% -1:200000 IJ SOLN
INTRAMUSCULAR | Status: AC
  Filled 2015-11-19: qty 30

## 2015-11-19 MED ORDER — OXYCODONE HCL 5 MG PO TABS
5.0000 mg | ORAL_TABLET | Freq: Once | ORAL | Status: AC | PRN
  Administered 2015-11-19: 5 mg via ORAL

## 2015-11-19 MED ORDER — PROPOFOL 10 MG/ML IV BOLUS
INTRAVENOUS | Status: DC | PRN
  Administered 2015-11-19: 180 mg via INTRAVENOUS

## 2015-11-19 MED ORDER — EPHEDRINE SULFATE 50 MG/ML IJ SOLN
INTRAMUSCULAR | Status: DC | PRN
  Administered 2015-11-19: 5 mg via INTRAVENOUS

## 2015-11-19 MED ORDER — MIDAZOLAM HCL 2 MG/2ML IJ SOLN
INTRAMUSCULAR | Status: DC | PRN
  Administered 2015-11-19: 2 mg via INTRAVENOUS

## 2015-11-19 MED ORDER — FENTANYL CITRATE (PF) 100 MCG/2ML IJ SOLN: 25.0000 ug | INTRAMUSCULAR | Status: DC | PRN

## 2015-11-19 MED ORDER — PHENYLEPHRINE HCL 10 MG/ML IJ SOLN
INTRAMUSCULAR | Status: DC | PRN
  Administered 2015-11-19 (×3): 200 ug via INTRAVENOUS

## 2015-11-19 SURGICAL SUPPLY — 23 items
CANISTER SUCT 1200ML W/VALVE (MISCELLANEOUS) ×3 IMPLANT
CHLORAPREP W/TINT 26ML (MISCELLANEOUS) ×3 IMPLANT
CNTNR SPEC 2.5X3XGRAD LEK (MISCELLANEOUS) ×1
CONT SPEC 4OZ STER OR WHT (MISCELLANEOUS) ×2
CONT SPEC 4OZ STRL OR WHT (MISCELLANEOUS) ×1
CONTAINER SPEC 2.5X3XGRAD LEK (MISCELLANEOUS) ×1 IMPLANT
DRAPE LAPAROTOMY 77X122 PED (DRAPES) ×3 IMPLANT
ELECT REM PT RETURN 9FT ADLT (ELECTROSURGICAL) ×3
ELECTRODE REM PT RTRN 9FT ADLT (ELECTROSURGICAL) ×1 IMPLANT
GLOVE BIO SURGEON STRL SZ7.5 (GLOVE) ×3 IMPLANT
GOWN STRL REUS W/ TWL LRG LVL3 (GOWN DISPOSABLE) ×2 IMPLANT
GOWN STRL REUS W/TWL LRG LVL3 (GOWN DISPOSABLE) ×6
KIT RM TURNOVER STRD PROC AR (KITS) ×3 IMPLANT
LABEL OR SOLS (LABEL) ×3 IMPLANT
LIQUID BAND (GAUZE/BANDAGES/DRESSINGS) ×3 IMPLANT
NEEDLE HYPO 25X1 1.5 SAFETY (NEEDLE) ×3 IMPLANT
PACK BASIN MINOR ARMC (MISCELLANEOUS) ×3 IMPLANT
SUT CHROMIC 4 0 RB 1X27 (SUTURE) ×3 IMPLANT
SUT MNCRL 4-0 (SUTURE) ×3
SUT MNCRL 4-0 27XMFL (SUTURE) ×1
SUTURE MNCRL 4-0 27XMF (SUTURE) ×1 IMPLANT
SYRINGE 10CC LL (SYRINGE) ×3 IMPLANT
WATER STERILE IRR 1000ML POUR (IV SOLUTION) ×3 IMPLANT

## 2015-11-19 NOTE — Discharge Instructions (Addendum)
Take Tylenol or Norco if needed for pain. ° °Should not drive or do anything dangerous when taking Norco. ° °May shower. ° °Wear bra as desired for comfort and support. ° ° ° ° °AMBULATORY SURGERY  °DISCHARGE INSTRUCTIONS ° ° °1) The drugs that you were given will stay in your system until tomorrow so for the next 24 hours you should not: ° °A) Drive an automobile °B) Make any legal decisions °C) Drink any alcoholic beverage ° ° °2) You may resume regular meals tomorrow.  Today it is better to start with liquids and gradually work up to solid foods. ° °You may eat anything you prefer, but it is better to start with liquids, then soup and crackers, and gradually work up to solid foods. ° ° °3) Please notify your doctor immediately if you have any unusual bleeding, trouble breathing, redness and pain at the surgery site, drainage, fever, or pain not relieved by medication. ° ° ° °4) Additional Instructions: ° ° ° ° ° ° ° °Please contact your physician with any problems or Same Day Surgery at [phone], Monday through Friday 6 am to 4 pm, or [place] [facility] [place] [facility] number at [phone]. °

## 2015-11-19 NOTE — Op Note (Signed)
OPERATIVE REPORT  PREOPERATIVE  DIAGNOSIS: . Left breast mass  POSTOPERATIVE DIAGNOSIS: . Left breast mass  PROCEDURE: . Excision left breast mass  ANESTHESIA:  General  SURGEON: Rochel Brome  MD   INDICATIONS: . She has a past history of carcinoma of the left breast. She recently discovered a palpable mass of the upper outer quadrant of the left breast. Ultrasound guided needle biopsy demonstrated fat necrosis. Excision of the whole mass was recommended for further evaluation as it appeared to be discordant with mammogram..  With the patient on the operating table in the supine position the breast was examined and determined the location of a palpable mass in the upper outer quadrant. This was also examined with ultrasound demonstrating the presence of the [facility] at the same site.  The left breast was prepared with ChloraPrep and draped in a sterile manner. A curvilinear incision was made in the upper outer quadrant directly overlying the mass at the same site of the [facility] and carried down through the thin layer of subcutaneous tissue. Electrocautery was used for hemostasis. The mass was dissected free from surrounding structures using electrocautery. The location of the [facility] was identified within the mass. This mass excision was approximately 2 and half to 3 cm in dimension. It was submitted fresh for routine pathology. The wound was inspected and several tiny bleeding points were cauterized. The subcutaneous tissues were infiltrated with half percent Sensorcaine with epinephrine. Subcutaneous tissues were approximated with interrupted inverted 4-0 chromic sutures. The skin was closed with running 4-0 Monocryl subcuticular suture and Dermabond. The patient tolerated surgery satisfactorily and was then prepared for transfer to the recovery room  [facility].D.

## 2015-11-19 NOTE — Anesthesia Postprocedure Evaluation (Signed)
Anesthesia Post Note  Patient: Joyce Maynard  Procedure(s) Performed: Procedure(s) (LRB): EXCISION OF BREAST LESION/ MASS (Left)  Patient location during evaluation: PACU Anesthesia Type: General Level of consciousness: awake and alert Pain management: pain level controlled Vital Signs Assessment: post-procedure vital signs reviewed and stable Respiratory status: spontaneous breathing, nonlabored ventilation, respiratory function stable and patient connected to nasal cannula oxygen Cardiovascular status: blood pressure returned to baseline and stable Postop Assessment: no signs of nausea or vomiting Anesthetic complications: no    Last Vitals:  Vitals:   11/19/15 1124 11/19/15 1139  BP: (!) 161/77 (!) 160/84  Pulse: 82 81  Resp: 16 14  Temp:  36.9 C    Last Pain:  Vitals:   11/19/15 1109  TempSrc:   PainSc: Asleep                 Precious Haws Piscitello

## 2015-11-19 NOTE — Anesthesia Procedure Notes (Signed)
Procedure Name: LMA Insertion Date/Time: 11/19/2015 9:47 AM Performed by: Silvana Newness Pre-anesthesia Checklist: Patient identified, Emergency Drugs available, Suction available, Patient being monitored and Timeout performed Patient Re-evaluated:Patient Re-evaluated prior to inductionOxygen Delivery Method: Circle system utilized Preoxygenation: Pre-oxygenation with 100% oxygen Intubation Type: IV induction Ventilation: Mask ventilation without difficulty LMA: LMA inserted LMA Size: 4.0 Number of attempts: 1 Placement Confirmation: positive ETCO2 and breath sounds checked- equal and bilateral Tube secured with: Tape Dental Injury: Teeth and Oropharynx as per pre-operative assessment

## 2015-11-19 NOTE — Transfer of Care (Signed)
Immediate Anesthesia Transfer of Care Note  Patient: Joyce Maynard  Procedure(s) Performed: Procedure(s): EXCISION OF BREAST LESION/ MASS (Left)  Patient Location: PACU  Anesthesia Type:General  Level of Consciousness: awake, alert , oriented and patient cooperative  Airway & Oxygen Therapy: Patient Spontanous Breathing and Patient connected to face mask oxygen  Post-op Assessment: Report given to RN, Post -op Vital signs reviewed and stable and Patient moving all extremities X 4  Post vital signs: Reviewed and stable  Last Vitals:  Vitals:   11/19/15 0849  BP: (!) 166/102  Pulse: 66  Resp: 14  Temp: (!) 36.1 C    Last Pain:  Vitals:   11/19/15 0849  TempSrc: Tympanic         Complications: No apparent anesthesia complications

## 2015-11-19 NOTE — H&P (Signed)
She comes in prepared for excision of a palpable mass of the upper outer quadrant of the left breast.  She reports no change in condition since the office exam.  Lab work was reviewed.  I discussed the plan for excision of left breast mass.

## 2015-11-19 NOTE — Anesthesia Preprocedure Evaluation (Addendum)
Anesthesia Evaluation  Patient identified by MRN, date of birth, ID band Patient awake    Reviewed: Allergy & Precautions, H&P , NPO status , Patient's Chart, lab work & pertinent test results  History of Anesthesia Complications (+) PONV and history of anesthetic complications  Airway Mallampati: III  TM Distance: >3 FB Neck ROM: full    Dental  (+) Poor Dentition, Chipped, Missing   Pulmonary neg pulmonary ROS, neg shortness of breath,    Pulmonary exam normal breath sounds clear to auscultation       Cardiovascular Exercise Tolerance: Good hypertension, (-) angina(-) Past MI and (-) DOE Normal cardiovascular exam Rhythm:regular Rate:Normal     Neuro/Psych PSYCHIATRIC DISORDERS Depression negative neurological ROS     GI/Hepatic Neg liver ROS, GERD  Controlled,  Endo/Other  diabetes, Type 2Hypothyroidism   Renal/GU      Musculoskeletal   Abdominal   Peds  Hematology negative hematology ROS (+)   Anesthesia Other Findings Signs and symptoms suggestive of sleep apnea   Past Medical History: 2005: Breast cancer ([place])     Comment: s/p chemotherapy and XRT No date: Depression No date: Diabetes ([place]) No date: GERD (gastroesophageal reflux disease) No date: Hx of vertigo No date: Hypercholesterolemia No date: Hypertension No date: Hypothyroidism 1948: Polio     Comment: history No date: PONV (postoperative nausea and vomiting)  Past Surgical History: 2005: BREAST BIOPSY Left 10/25/2015: BREAST BIOPSY Left     Comment: US guided biopsy 2005: BREAST LUMPECTOMY Left No date: EYE SURGERY     Comment: cataracts bilateral No date: TUBAL LIGATION  BMI    Body Mass Index:  33.89 kg/m      Reproductive/Obstetrics negative OB ROS                            Anesthesia Physical Anesthesia Plan  ASA: III  Anesthesia Plan: General LMA   Post-op Pain Management:    Induction:    Airway Management Planned:   Additional Equipment:   Intra-op Plan:   Post-operative Plan:   Informed Consent: I have reviewed the patients History and Physical, chart, labs and discussed the procedure including the risks, benefits and alternatives for the proposed anesthesia with the patient or authorized representative who has indicated his/her understanding and acceptance.   Dental Advisory Given  Plan Discussed with: Anesthesiologist, CRNA and Surgeon  Anesthesia Plan Comments:         Anesthesia Quick Evaluation

## 2015-11-20 LAB — SURGICAL PATHOLOGY

## 2015-11-21 ENCOUNTER — Encounter: Payer: Self-pay | Admitting: Surgery

## 2015-12-03 ENCOUNTER — Ambulatory Visit: Payer: Medicare Other

## 2015-12-05 ENCOUNTER — Other Ambulatory Visit: Payer: Self-pay | Admitting: Internal Medicine

## 2015-12-05 NOTE — Telephone Encounter (Signed)
Last filled 09/11/15 30 1rf

## 2015-12-06 NOTE — Telephone Encounter (Signed)
Can refill x 1.  Call and inform her that she needs to keep her appt in December.

## 2015-12-10 ENCOUNTER — Encounter: Payer: Self-pay | Admitting: Internal Medicine

## 2015-12-10 ENCOUNTER — Ambulatory Visit (INDEPENDENT_AMBULATORY_CARE_PROVIDER_SITE_OTHER): Payer: Medicare Other | Admitting: Internal Medicine

## 2015-12-10 VITALS — BP 132/82 | HR 81 | Temp 98.6°F | Ht 66.0 in | Wt 210.8 lb

## 2015-12-10 DIAGNOSIS — E1140 Type 2 diabetes mellitus with diabetic neuropathy, unspecified: Secondary | ICD-10-CM

## 2015-12-10 DIAGNOSIS — E039 Hypothyroidism, unspecified: Secondary | ICD-10-CM | POA: Diagnosis not present

## 2015-12-10 DIAGNOSIS — I10 Essential (primary) hypertension: Secondary | ICD-10-CM

## 2015-12-10 DIAGNOSIS — K219 Gastro-esophageal reflux disease without esophagitis: Secondary | ICD-10-CM

## 2015-12-10 DIAGNOSIS — F320 Major depressive disorder, single episode, mild: Secondary | ICD-10-CM

## 2015-12-10 DIAGNOSIS — E7800 Pure hypercholesterolemia, unspecified: Secondary | ICD-10-CM | POA: Diagnosis not present

## 2015-12-10 DIAGNOSIS — R05 Cough: Secondary | ICD-10-CM

## 2015-12-10 DIAGNOSIS — F32A Depression, unspecified: Secondary | ICD-10-CM

## 2015-12-10 DIAGNOSIS — Z853 Personal history of malignant neoplasm of breast: Secondary | ICD-10-CM | POA: Diagnosis not present

## 2015-12-10 DIAGNOSIS — R059 Cough, unspecified: Secondary | ICD-10-CM

## 2015-12-10 LAB — TSH: TSH: 2.54 u[IU]/mL (ref 0.35–4.50)

## 2015-12-10 NOTE — Progress Notes (Signed)
[name], female   DOB: 03-21-1940, 75 y.o.   MRN: [id number]   Subjective:    [name], female    DOB: [birth year], 75 y.o.   MRN: [id number]  HPI  Patient here for a scheduled follow up.  She reports she is doing relatively well.  Over the last week has had a cough.  Minimal drainage.  No nausea or vomiting.  No diarrhea.  Temperature yesterday - 99.  Feeling better.  No chest pain.  No sob.  No acid reflux.  No abdominal pain or cramping.  Bowels stable.  States am sugars averaging <150.  Brought in no recorded sugar readings.  Discussed last labs.  Recently evaluated for abnormal mammogram.  Had benign biopsy.  Evaluated by Dr Tamala Julian.  Recommended f/u in 6 months.     Past Medical History:  Diagnosis Date  . Breast cancer ([place]) 2005   s/p chemotherapy and XRT  . Depression   . Diabetes ([place])   . GERD (gastroesophageal reflux disease)   . Hx of vertigo   . Hypercholesterolemia   . Hypertension   . Hypothyroidism   . Polio 1948   history  . PONV (postoperative nausea and vomiting)    Past Surgical History:  Procedure Laterality Date  . BREAST BIOPSY Left 2005  . BREAST BIOPSY Left 10/25/2015   US guided biopsy  . BREAST LUMPECTOMY Left 2005  . EXCISION OF BREAST LESION Left 11/19/2015   Procedure: EXCISION OF BREAST LESION/ MASS;  Surgeon: Leonie Green, MD;  Location: [facility] ORS;  Service: General;  Laterality: Left;  . EYE SURGERY     cataracts bilateral  . TUBAL LIGATION     Family History  Problem Relation Age of Onset  . Cancer Mother     breast  . Breast cancer Mother 52  . Stroke Father   . Breast cancer Maternal Aunt    Social History   Social History  . Marital status: Married    Spouse name: N/A  . Number of children: N/A  . Years of education: N/A   Social History Main Topics  . Smoking status: Never Smoker  . Smokeless tobacco: Never Used  . Alcohol use No  . Drug use: No  . Sexual activity: No   Other Topics  Concern  . None   Social History Narrative  . None    Outpatient Encounter Prescriptions as of 12/10/2015  Medication Sig  . acetaminophen (TYLENOL) 500 MG tablet Take 1,000 mg by mouth every 6 (six) hours as needed for mild pain.  Marland Kitchen aspirin EC 81 MG tablet Take 81 mg by mouth daily. Has stopped prior to procedure  . atorvastatin (LIPITOR) 10 MG tablet TAKE ONE TABLET BY MOUTH ONCE DAILY  . BD ULTRA-FINE LANCETS lancets Test blood glucose BID DX code 250.00  . bismuth subsalicylate (PEPTO BISMOL) 262 MG chewable tablet Chew 524 mg by mouth as needed.  . Calcium Carbonate-Vitamin D (CALCIUM-VITAMIN D) 500-200 MG-UNIT tablet Take 1 tablet by mouth daily.  Marland Kitchen glimepiride (AMARYL) 2 MG tablet TAKE ONE TABLET BY MOUTH TWICE DAILY  . glucose blood (ONE TOUCH ULTRA TEST) test strip Test blood sugar BID Dx Code:  250.00 Pt uses Contour Meter  . HYDROcodone-acetaminophen (NORCO) 5-325 MG tablet Take 1-2 tablets by mouth every 4 (four) hours as needed for moderate pain.  Marland Kitchen levothyroxine (SYNTHROID, LEVOTHROID) 150 MCG tablet Take 1 tablet (150 mcg total) by mouth daily.  Marland Kitchen LORazepam (  ATIVAN) 1 MG tablet TAKE ONE TABLET BY MOUTH DAILY AS NEEDED FOR ANXIETY  . losartan (COZAAR) 100 MG tablet TAKE ONE TABLET BY MOUTH ONCE DAILY  . metFORMIN (GLUCOPHAGE) 1000 MG tablet TAKE ONE TABLET BY MOUTH TWICE DAILY WITH A MEAL  . metoprolol succinate (TOPROL-XL) 25 MG 24 hr tablet TAKE ONE TABLET BY MOUTH TWICE DAILY (Patient taking differently: TAKE ONE TABLET BY MOUTH ONCE DAILY)  . naproxen (NAPROSYN) 500 MG tablet Take 1 tablet (500 mg total) by mouth 2 (two) times daily with a meal.  . pantoprazole (PROTONIX) 40 MG tablet TAKE ONE TABLET BY MOUTH TWICE DAILY *MUST BE SEEN FOR FURTHER REFILLS*  . venlafaxine XR (EFFEXOR-XR) 150 MG 24 hr capsule TAKE ONE CAPSULE BY MOUTH ONCE DAILY WITH BREAKFAST  . vitamin C (ASCORBIC ACID) 500 MG tablet Take 500 mg by mouth daily.   No facility-administered encounter  medications on file as of 12/10/2015.     Review of Systems  Constitutional: Negative for appetite change and unexpected weight change.  HENT: Positive for congestion and postnasal drip. Negative for sinus pressure.   Respiratory: Positive for cough. Negative for chest tightness and shortness of breath.   Cardiovascular: Negative for chest pain, palpitations and leg swelling.  Gastrointestinal: Negative for abdominal pain, diarrhea, nausea and vomiting.  Genitourinary: Negative for difficulty urinating and dysuria.  Musculoskeletal: Negative for back pain and joint swelling.  Skin: Negative for color change and rash.  Neurological: Negative for dizziness, light-headedness and headaches.  Psychiatric/Behavioral: Negative for agitation and dysphoric mood.       Objective:    Physical Exam  Constitutional: She appears well-developed and well-nourished. No distress.  HENT:  Nose: Nose normal.  Mouth/Throat: Oropharynx is clear and moist.  Neck: Neck supple. No thyromegaly present.  Cardiovascular: Normal rate and regular rhythm.   Pulmonary/Chest: Breath sounds normal. No respiratory distress. She has no wheezes.  Abdominal: Soft. Bowel sounds are normal. There is no tenderness.  Musculoskeletal: She exhibits no edema or tenderness.  Lymphadenopathy:    She has no cervical adenopathy.  Skin: No rash noted. No erythema.  Psychiatric: She has a normal mood and affect. Her behavior is normal.    BP 132/82   Pulse 81   Temp 98.6 F (37 C) (Oral)   Ht _0  (1.676 m)   Wt 210 lb 12.8 oz (95.6 kg)   SpO2 96%   BMI 34.02 kg/m  Wt Readings from Last 3 Encounters:  12/10/15 210 lb 12.8 oz (95.6 kg)  11/19/15 210 lb (95.3 kg)  11/15/15 210 lb (95.3 kg)     Lab Results  Component Value Date   WBC 6.7 09/10/2015   HGB 13.9 11/19/2015   HCT 41.0 11/19/2015   PLT 210.0 09/10/2015   GLUCOSE 203 (H) 11/19/2015   CHOL 180 09/10/2015   TRIG 182.0 (H) 09/10/2015   HDL 46.60  09/10/2015   LDLDIRECT 109.9 11/01/2012   LDLCALC 97 09/10/2015   ALT 31 09/10/2015   AST 34 09/10/2015   NA 141 11/19/2015   K 3.9 11/19/2015   CL 107 09/23/2015   CREATININE 1.14 09/23/2015   BUN 18 09/23/2015   CO2 25 09/23/2015   TSH 2.54 12/10/2015   HGBA1C 7.5 (H) 09/10/2015   MICROALBUR <0.7 10/04/2014       Assessment & Plan:   Problem List Items Addressed This Visit    Diabetes ([place])    Brought in no recorded sugar readings.  a1c 7.5.  Discussed diet  and exercise.  Follow met b and a1c.        Relevant Orders   Hemoglobin A1c   Microalbumin / creatinine urine ratio   Essential hypertension, benign    Blood pressure under good control.  Continue same medication regimen.  Follow pressures.  Follow metabolic panel.        Relevant Orders   Basic metabolic panel   GERD (gastroesophageal reflux disease)    On protonix.  Symptoms controlled.        History of breast cancer    Recently evaluated by Dr Tamala Julian for abnormal mammogram.  Biopsy benign.  Recommended f/u in 6 months.        Hypercholesterolemia    On lipitor.  Low cholesterol diet and exercise.  Follow lipid panel and liver function tests.        Relevant Orders   Lipid panel   Hepatic function panel   Hypothyroidism - Primary    On thyroid replacement.  Follow tsh.        Relevant Orders   TSH (Completed)   TSH   Mild depression ([place])    On effexor.  Takes lorazepam.  Stable. Follow.         Other Visit Diagnoses    Cough       Improved.  some residual.  robitussin.  saline and flonase or nasacort as directed.  follow.         Einar Pheasant, MD

## 2015-12-10 NOTE — Progress Notes (Signed)
Pre visit review using our clinic review tool, if applicable. No additional management support is needed unless otherwise documented below in the visit note. 

## 2015-12-15 ENCOUNTER — Encounter: Payer: Self-pay | Admitting: Internal Medicine

## 2015-12-15 NOTE — Assessment & Plan Note (Signed)
Recently evaluated by Dr Tamala Julian for abnormal mammogram.  Biopsy benign.  Recommended f/u in 6 months.

## 2015-12-15 NOTE — Assessment & Plan Note (Signed)
Brought in no recorded sugar readings.  a1c 7.5.  Discussed diet and exercise.  Follow met b and a1c.

## 2015-12-15 NOTE — Assessment & Plan Note (Signed)
On protonix.  Symptoms controlled.   

## 2015-12-15 NOTE — Assessment & Plan Note (Signed)
On lipitor.  Low cholesterol diet and exercise.  Follow lipid panel and liver function tests.   

## 2015-12-15 NOTE — Assessment & Plan Note (Signed)
Blood pressure under good control.  Continue same medication regimen.  Follow pressures.  Follow metabolic panel.   

## 2015-12-15 NOTE — Assessment & Plan Note (Signed)
On effexor.  Takes lorazepam.  Stable. Follow.

## 2015-12-15 NOTE — Assessment & Plan Note (Signed)
On thyroid replacement.  Follow tsh.  

## 2015-12-28 ENCOUNTER — Other Ambulatory Visit: Payer: Self-pay | Admitting: Internal Medicine

## 2016-01-11 IMAGING — MG MM DIGITAL SCREENING BILAT W/ CAD
1 series · 6 of 6 positions shown · non-contrast
Comparison: Previous exam(s).

CLINICAL DATA: Screening.

EXAM:
DIGITAL SCREENING BILATERAL MAMMOGRAM WITH CAD

[R CC · right · 6 of 6 slices shown]
[im 1/6]
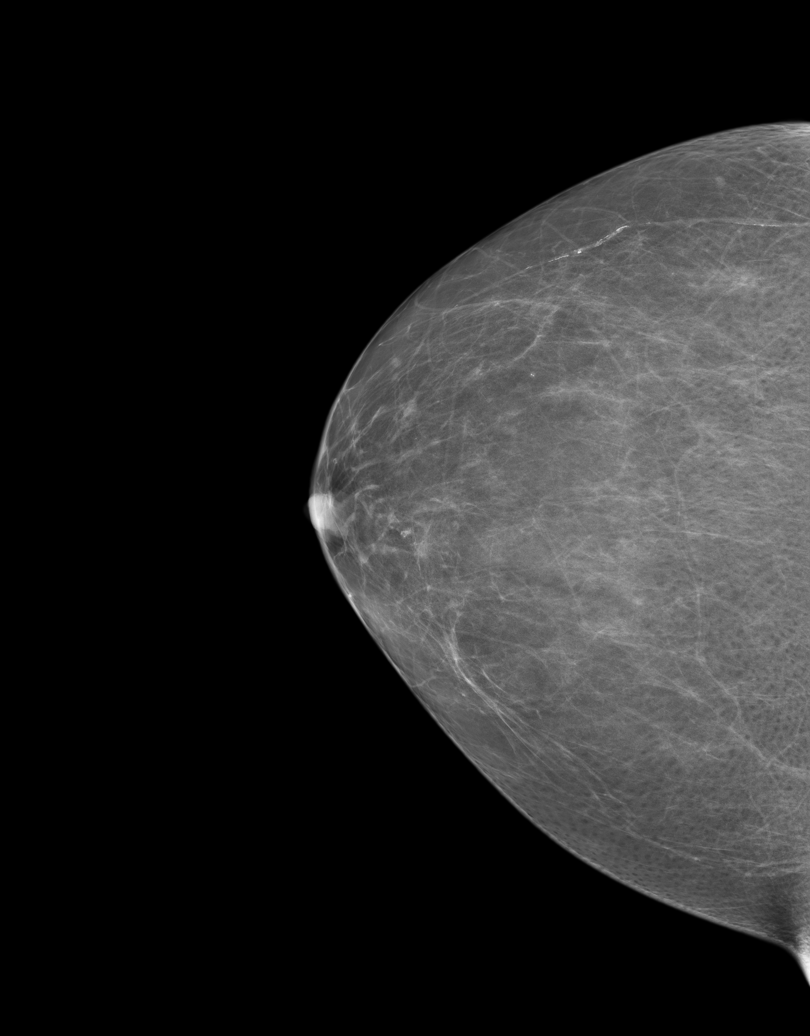
[im 2/6]
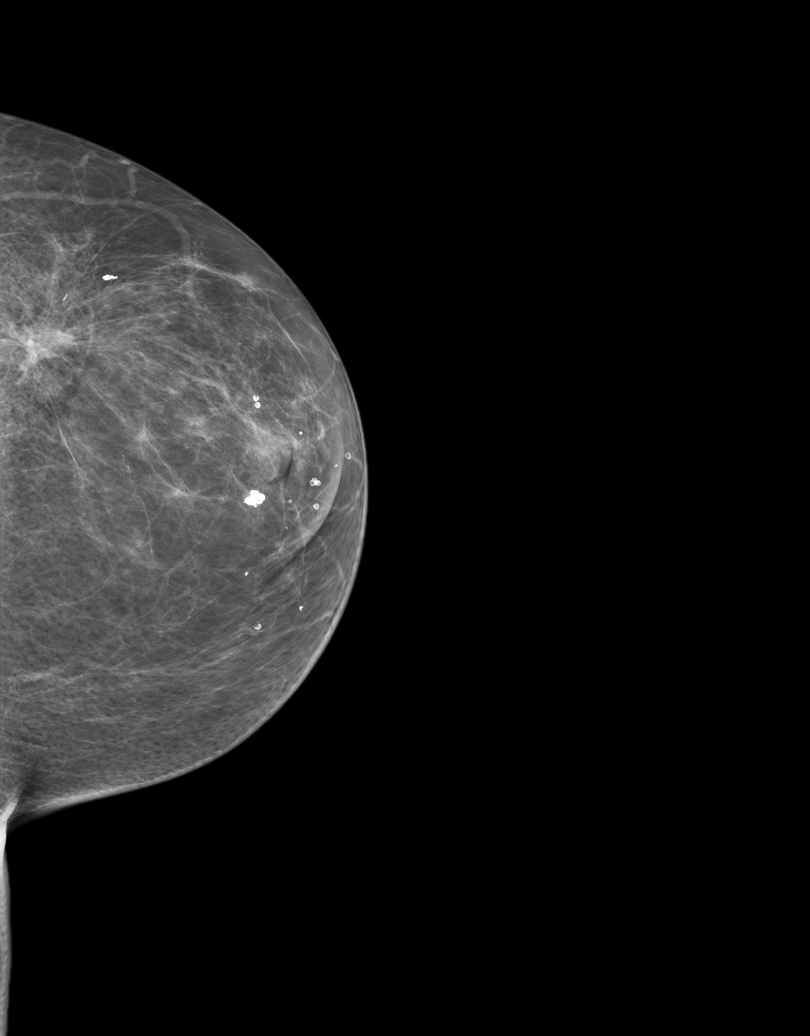
[im 3/6]
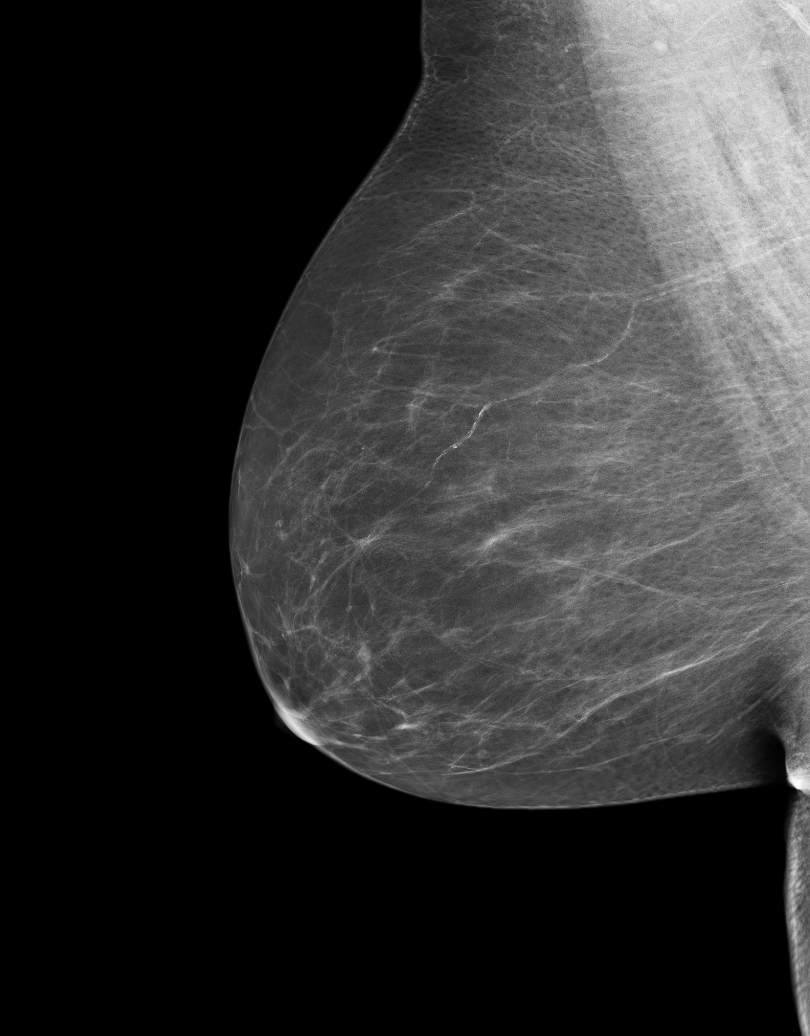
[im 4/6]
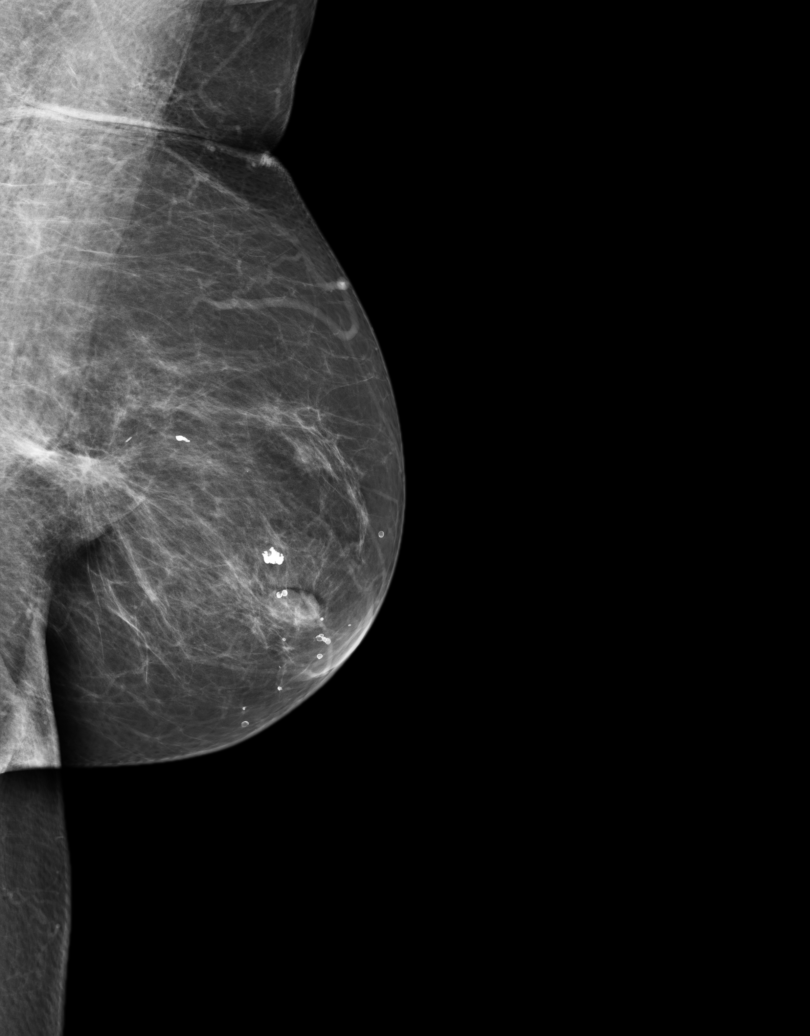
[im 5/6]
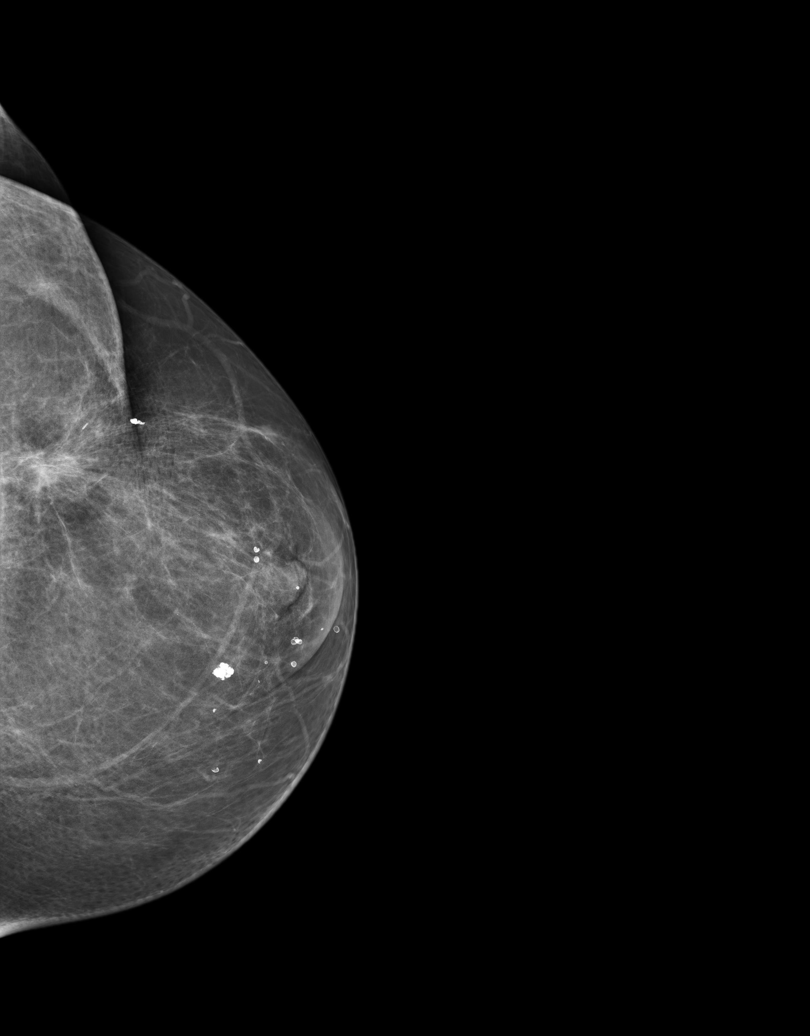
[im 6/6]
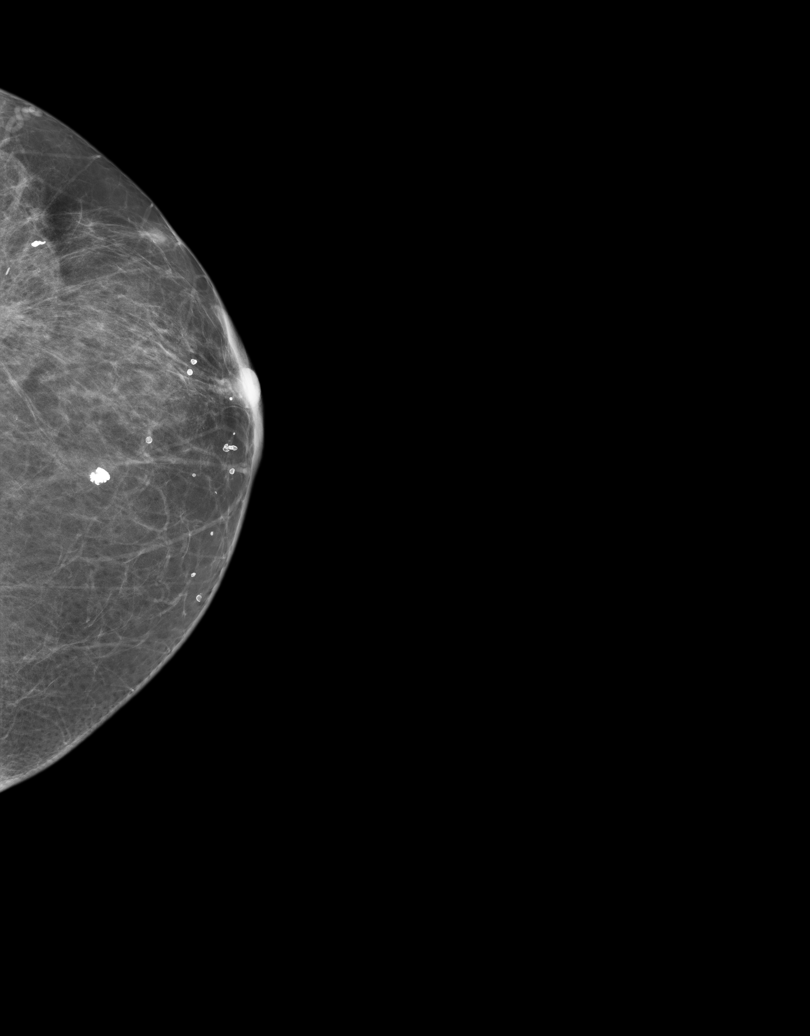

[6 of 6 positions shown; findings below may reference images not displayed]

ACR Breast Density Category b: There are scattered areas of
fibroglandular density.
FINDINGS: There are no findings suspicious for malignancy. Images were
processed with CAD.
IMPRESSION: No mammographic evidence of malignancy. A result letter of this
screening mammogram will be mailed directly to the patient.

RECOMMENDATION:
Screening mammogram in one year. (Code:AS-G-LCT)

BI-RADS CATEGORY  1: Negative.

## 2016-01-22 ENCOUNTER — Other Ambulatory Visit: Payer: Self-pay | Admitting: Internal Medicine

## 2016-01-24 NOTE — Telephone Encounter (Signed)
Last filled 12/07/15 Last office visit 12/10/15

## 2016-02-17 ENCOUNTER — Encounter: Payer: Medicare Other | Admitting: Internal Medicine

## 2016-02-21 ENCOUNTER — Telehealth: Payer: Self-pay | Admitting: Internal Medicine

## 2016-02-21 NOTE — Telephone Encounter (Signed)
Lm on vm several times to sched a follow up appt with Dr.Scott

## 2016-02-24 ENCOUNTER — Telehealth: Payer: Self-pay | Admitting: Internal Medicine

## 2016-02-24 NOTE — Telephone Encounter (Signed)
Pt declined to get the AWV. Thank you! °

## 2016-02-27 ENCOUNTER — Other Ambulatory Visit: Payer: Self-pay | Admitting: Internal Medicine

## 2016-02-27 NOTE — Telephone Encounter (Signed)
Last refill of protonix states needs to be seen for more refills. Does that mean for all meds.

## 2016-02-28 NOTE — Telephone Encounter (Signed)
Needs a f/u appt. Scheduled.  Once scheduled, can refill these medications x 1.   She has to keep appt to get more refills.

## 2016-02-28 NOTE — Telephone Encounter (Signed)
Cell did not have v/m set up and l/m on home number

## 2016-03-03 NOTE — Telephone Encounter (Signed)
Called cell v/m not set up lmtrc at home number

## 2016-03-05 ENCOUNTER — Other Ambulatory Visit: Payer: Self-pay

## 2016-03-05 NOTE — Telephone Encounter (Signed)
Refilled on 12/05/2015 Last Ov: 12/10/2015 Next Ov: 05/11/2016  Last refill states that pt must be seen before anymore refills.  Please advise.

## 2016-03-06 MED ORDER — PANTOPRAZOLE SODIUM 40 MG PO TBEC: DELAYED_RELEASE_TABLET | ORAL | 2 refills | Status: DC

## 2016-03-06 NOTE — Telephone Encounter (Signed)
rx ok'd for protonix #60 with 2 refills.  Must keep scheduled appt.

## 2016-03-09 ENCOUNTER — Other Ambulatory Visit: Payer: Self-pay

## 2016-03-09 NOTE — Telephone Encounter (Signed)
Refilled 01/26/2016.  Last Ov: 12/10/2015 Next Ov: 05/11/2016  Please advise.

## 2016-03-10 MED ORDER — LORAZEPAM 1 MG PO TABS: ORAL_TABLET | ORAL | 0 refills | Status: DC

## 2016-03-10 NOTE — Telephone Encounter (Signed)
Lm to call

## 2016-03-10 NOTE — Telephone Encounter (Signed)
Printed and faxed

## 2016-03-11 NOTE — Telephone Encounter (Signed)
rx ok'd for effexor #30 with one refill.

## 2016-04-05 ENCOUNTER — Telehealth: Payer: Self-pay | Admitting: Internal Medicine

## 2016-04-06 NOTE — Telephone Encounter (Signed)
Please advise for refill, has appt in May with you. thanks

## 2016-04-07 NOTE — Telephone Encounter (Signed)
Pharmacy called looking for an update on this medication. Please advise, thank you!  [place] (N), [place]

## 2016-04-07 NOTE — Telephone Encounter (Signed)
Faxed

## 2016-05-03 ENCOUNTER — Other Ambulatory Visit: Payer: Self-pay | Admitting: Internal Medicine

## 2016-05-04 NOTE — Telephone Encounter (Signed)
Ok to refill 

## 2016-05-05 NOTE — Telephone Encounter (Signed)
ok'd effexor and lorazepam x 1.  Metoprolol #60 with one refill.   Notify pt that she has to keep her f/u appt with me in the next week for more refills.

## 2016-05-05 NOTE — Telephone Encounter (Signed)
faxed

## 2016-05-11 ENCOUNTER — Encounter: Payer: Medicare Other | Admitting: Internal Medicine

## 2016-05-15 ENCOUNTER — Telehealth: Payer: Self-pay | Admitting: Internal Medicine

## 2016-05-15 NOTE — Telephone Encounter (Signed)
Left pt message asking to call Allison back directly at [phone] to schedule AWV. Thanks! °

## 2016-06-03 ENCOUNTER — Other Ambulatory Visit: Payer: Self-pay | Admitting: Internal Medicine

## 2016-06-05 ENCOUNTER — Other Ambulatory Visit: Payer: Self-pay

## 2016-06-05 MED ORDER — LOSARTAN POTASSIUM 100 MG PO TABS: 100.0000 mg | ORAL_TABLET | Freq: Every day | ORAL | 2 refills | Status: DC

## 2016-06-05 MED ORDER — ATORVASTATIN CALCIUM 10 MG PO TABS: 10.0000 mg | ORAL_TABLET | Freq: Every day | ORAL | 2 refills | Status: DC

## 2016-06-05 NOTE — Telephone Encounter (Signed)
medication has been refilled. 

## 2016-06-11 NOTE — Telephone Encounter (Signed)
Left pt message asking to call Allison back directly at [phone] to schedule AWV. Thanks! °

## 2016-08-05 ENCOUNTER — Other Ambulatory Visit: Payer: Self-pay | Admitting: Internal Medicine

## 2016-08-17 ENCOUNTER — Telehealth: Payer: Self-pay | Admitting: Internal Medicine

## 2016-08-17 ENCOUNTER — Other Ambulatory Visit: Payer: Self-pay | Admitting: Internal Medicine

## 2016-08-17 NOTE — Telephone Encounter (Signed)
Pt called needing a refill for LORazepam (ATIVAN) 1 MG tablet.   Pharmacy is [facility] ([place]), [place]  Call pt @ [phone]. Thank you!

## 2016-08-19 ENCOUNTER — Other Ambulatory Visit: Payer: Self-pay | Admitting: Internal Medicine

## 2016-08-19 NOTE — Telephone Encounter (Signed)
Ok to refill x 1.  Needs an earlier appt and needs to keep appt.

## 2016-08-19 NOTE — Telephone Encounter (Signed)
Pt son called to follow up on refill. Please advise?  Call son @ [phone]. Thank you!

## 2016-08-19 NOTE — Telephone Encounter (Signed)
Ativan 1mg   Last script 05/05/16 Last f/u 12/10/15 F/u 11/16/16

## 2016-08-20 ENCOUNTER — Other Ambulatory Visit: Payer: Self-pay | Admitting: Internal Medicine

## 2016-08-20 MED ORDER — LORAZEPAM 1 MG PO TABS: ORAL_TABLET | ORAL | 0 refills | Status: DC

## 2016-08-20 NOTE — Telephone Encounter (Signed)
Pt's son Joyce Maynard hs requested to have this script filled today. Pt is out of this medication

## 2016-08-20 NOTE — Telephone Encounter (Signed)
Son came in office script given and app moved.

## 2016-08-20 NOTE — Progress Notes (Signed)
rx ok'd for ativan #30 with no refills.  Has to make earlier appt.  See phone note.

## 2016-09-14 ENCOUNTER — Other Ambulatory Visit: Payer: Self-pay | Admitting: Internal Medicine

## 2016-09-15 NOTE — Telephone Encounter (Signed)
Pt son Joyce Maynard called and stated that pt is out of her antidepressant since yesterday. Please call when completed. Thank you! Call [place] @ [phone]

## 2016-09-15 NOTE — Telephone Encounter (Signed)
Please advise for refills, last OV was 12/10/2015, last refills were  Ativan on 8/16 for #30, other meds  Prior.

## 2016-09-16 NOTE — Telephone Encounter (Signed)
Please call and notify that Joyce Maynard has an appt on 10/06/16 and has to keep this appt to get more medication.  Ok to refill each medication for 30 days, but will not continue to refill if she does not keep appt.

## 2016-09-16 NOTE — Telephone Encounter (Signed)
Spoke with the patient's son, he agreed that she has an appt on the 2nd and he plans to be here with her that day.  Filled each med for only the 30 days. thanks

## 2016-10-06 ENCOUNTER — Encounter: Payer: Medicare Other | Admitting: Internal Medicine

## 2016-10-06 DIAGNOSIS — Z0289 Encounter for other administrative examinations: Secondary | ICD-10-CM

## 2016-10-21 ENCOUNTER — Other Ambulatory Visit: Payer: Self-pay | Admitting: Internal Medicine

## 2016-10-21 NOTE — Telephone Encounter (Signed)
She missed another appt recently.  Notify pt that I am unable to continue to refill medication and her not keep her regular appts.

## 2016-10-21 NOTE — Telephone Encounter (Signed)
Reviewed patient chart for information  Last lab 12/15/15 Last o/v 12/10/15  App missed  02/17/16 CPE provider out of office 05/11/16 CPE C/A  10/06/16 N/S CPE  11/16/16 CPE C/A  Phone messages that Patient and or son told that she had to have f/u for more refills 02/27/16 03/05/16 05/03/16 08/17/2016 09/14/2016

## 2016-10-22 ENCOUNTER — Telehealth: Payer: Self-pay | Admitting: Internal Medicine

## 2016-10-22 NOTE — Telephone Encounter (Signed)
Please advise AC came and talked to you about this patient.

## 2016-10-22 NOTE — Telephone Encounter (Signed)
Patient has cancelled numerous appts in recent past, not from PCP states needs to keep appt for further refills, please advise, thanks

## 2016-10-22 NOTE — Telephone Encounter (Signed)
Pt son Sharde Gover LTR [phone] will make sur that pt is at next appt for med refill.Marland Kitchen He is aware of what is going on. Pt needs a refill on venlafaxine XR (EFFEXOR-XR) 150 MG 24 hr capsule.. Pt is sched for follow up on 11/6 @12 

## 2016-10-22 NOTE — Telephone Encounter (Signed)
Ok to refill? Their is an open note under refill request as well.

## 2016-10-23 MED ORDER — GLIMEPIRIDE 2 MG PO TABS: 2.0000 mg | ORAL_TABLET | Freq: Two times a day (BID) | ORAL | 0 refills | Status: DC

## 2016-10-23 MED ORDER — METFORMIN HCL 1000 MG PO TABS: 1000.0000 mg | ORAL_TABLET | Freq: Two times a day (BID) | ORAL | 0 refills | Status: DC

## 2016-10-23 NOTE — Telephone Encounter (Signed)
rx ok'd for amaryl and metformin.

## 2016-10-23 NOTE — Telephone Encounter (Signed)
Scrip faxed

## 2016-10-23 NOTE — Telephone Encounter (Signed)
ok'd meds.  Lorazepam will need to be faxed.  Caryl Pina informed would have to keep next appt for any more refills. Notified POA - per note.

## 2016-10-23 NOTE — Telephone Encounter (Signed)
rx ok'd for meds (lorazepam, protonix, metoprolol and effexor)

## 2016-11-10 ENCOUNTER — Encounter: Payer: Self-pay | Admitting: Internal Medicine

## 2016-11-10 ENCOUNTER — Ambulatory Visit (INDEPENDENT_AMBULATORY_CARE_PROVIDER_SITE_OTHER): Payer: Medicare Other | Admitting: Internal Medicine

## 2016-11-10 VITALS — BP 124/84 | HR 104 | Temp 98.9°F | Resp 18 | Ht 66.14 in | Wt 203.6 lb

## 2016-11-10 DIAGNOSIS — Z853 Personal history of malignant neoplasm of breast: Secondary | ICD-10-CM

## 2016-11-10 DIAGNOSIS — F320 Major depressive disorder, single episode, mild: Secondary | ICD-10-CM

## 2016-11-10 DIAGNOSIS — F32A Depression, unspecified: Secondary | ICD-10-CM

## 2016-11-10 DIAGNOSIS — K219 Gastro-esophageal reflux disease without esophagitis: Secondary | ICD-10-CM

## 2016-11-10 DIAGNOSIS — Z23 Encounter for immunization: Secondary | ICD-10-CM | POA: Diagnosis not present

## 2016-11-10 DIAGNOSIS — I10 Essential (primary) hypertension: Secondary | ICD-10-CM | POA: Diagnosis not present

## 2016-11-10 DIAGNOSIS — E1140 Type 2 diabetes mellitus with diabetic neuropathy, unspecified: Secondary | ICD-10-CM

## 2016-11-10 DIAGNOSIS — E7800 Pure hypercholesterolemia, unspecified: Secondary | ICD-10-CM | POA: Diagnosis not present

## 2016-11-10 DIAGNOSIS — E039 Hypothyroidism, unspecified: Secondary | ICD-10-CM

## 2016-11-10 LAB — CBC WITH DIFFERENTIAL/PLATELET
BASOS PCT: 0.6 % (ref 0.0–3.0)
Basophils Absolute: 0 10*3/uL (ref 0.0–0.1)
EOS PCT: 1.8 % (ref 0.0–5.0)
Eosinophils Absolute: 0.1 10*3/uL (ref 0.0–0.7)
HEMATOCRIT: 40.9 % (ref 36.0–46.0)
HEMOGLOBIN: 13.9 g/dL (ref 12.0–15.0)
Lymphocytes Relative: 26 % (ref 12.0–46.0)
Lymphs Abs: 1.7 10*3/uL (ref 0.7–4.0)
MCHC: 34.1 g/dL (ref 30.0–36.0)
MCV: 103.9 fl — AB (ref 78.0–100.0)
Monocytes Absolute: 0.4 10*3/uL (ref 0.1–1.0)
Monocytes Relative: 6.4 % (ref 3.0–12.0)
Neutro Abs: 4.3 10*3/uL (ref 1.4–7.7)
Neutrophils Relative %: 65.2 % (ref 43.0–77.0)
Platelets: 222 10*3/uL (ref 150.0–400.0)
RBC: 3.93 Mil/uL (ref 3.87–5.11)
RDW: 13.4 % (ref 11.5–15.5)
WBC: 6.6 10*3/uL (ref 4.0–10.5)

## 2016-11-10 LAB — HM DIABETES FOOT EXAM

## 2016-11-10 LAB — BASIC METABOLIC PANEL
BUN: 18 mg/dL (ref 6–23)
CHLORIDE: 105 meq/L (ref 96–112)
CO2: 26 mEq/L (ref 19–32)
CREATININE: 1.06 mg/dL (ref 0.40–1.20)
Calcium: 9.8 mg/dL (ref 8.4–10.5)
GFR: 53.52 mL/min — AB (ref >60.00)
Glucose, Bld: 180 mg/dL — ABNORMAL HIGH (ref 70–99)
Potassium: 4.4 mEq/L (ref 3.5–5.1)
SODIUM: 139 meq/L (ref 135–145)

## 2016-11-10 LAB — MICROALBUMIN / CREATININE URINE RATIO
Creatinine,U: 184.1 mg/dL
Microalb Creat Ratio: 0.5 mg/g (ref 0.0–30.0)
Microalb, Ur: 0.9 mg/dL (ref 0.0–1.9)

## 2016-11-10 LAB — GLUCOSE, POCT (MANUAL RESULT ENTRY): POC GLUCOSE: 172 mg/dL — AB (ref 70–99)

## 2016-11-10 LAB — HEPATIC FUNCTION PANEL
ALBUMIN: 4 g/dL (ref 3.5–5.2)
ALK PHOS: 65 U/L (ref 39–117)
ALT: 32 U/L (ref 0–35)
AST: 46 U/L — AB (ref 0–37)
Bilirubin, Direct: 0.1 mg/dL (ref 0.0–0.3)
TOTAL PROTEIN: 7.8 g/dL (ref 6.0–8.3)
Total Bilirubin: 0.6 mg/dL (ref 0.2–1.2)

## 2016-11-10 LAB — LIPID PANEL
CHOLESTEROL: 212 mg/dL — AB (ref 0–200)
HDL: 49.1 mg/dL (ref >39.00)
LDL Cholesterol: 127 mg/dL — ABNORMAL HIGH (ref 0–99)
NONHDL: 162.73
Total CHOL/HDL Ratio: 4
Triglycerides: 178 mg/dL — ABNORMAL HIGH (ref 0.0–149.0)
VLDL: 35.6 mg/dL (ref 0.0–40.0)

## 2016-11-10 LAB — TSH: TSH: 15.69 u[IU]/mL — AB (ref 0.35–4.50)

## 2016-11-10 LAB — HEMOGLOBIN A1C: Hgb A1c MFr Bld: 7.1 % — ABNORMAL HIGH (ref 4.6–6.5)

## 2016-11-10 NOTE — Progress Notes (Signed)
[name], female   DOB: Feb 17, 1940, 76 y.o.   MRN: [id number]   Subjective:    [name], female    DOB: 1940-06-10, 76 y.o.   MRN: [id number]  HPI  Patient here for a scheduled follow up.  She has no showed or cancelled multiple appts.  Has been one year since she has been seen.  States she is taking her medication.  We discussed the need for regular f/u.  States she is doing well.  Feels good.  Increased stress, but she feels she is handling things relatively well.  No chest pain.  No sob.  No acid reflux.  No abdominal pain. Bowls moving.  Discussed the need for pneumonia shot.  Weight down some.  Watching her diet.  Discussed diet and exercise.  States highest sugar reading is 150.  Did not bring in readings.     Past Medical History:  Diagnosis Date  . Breast cancer ([place]) 2005   s/p chemotherapy and XRT  . Depression   . Diabetes ([place])   . GERD (gastroesophageal reflux disease)   . Hx of vertigo   . Hypercholesterolemia   . Hypertension   . Hypothyroidism   . Polio 1948   history  . PONV (postoperative nausea and vomiting)    Past Surgical History:  Procedure Laterality Date  . BREAST BIOPSY Left 2005  . BREAST BIOPSY Left 10/25/2015   US guided biopsy  . BREAST LUMPECTOMY Left 2005  . EYE SURGERY     cataracts bilateral  . TUBAL LIGATION     Family History  Problem Relation Age of Onset  . Cancer Mother        breast  . Breast cancer Mother 28  . Stroke Father   . Breast cancer Maternal Aunt    Social History   Socioeconomic History  . Marital status: Married    Spouse name: None  . Number of children: None  . Years of education: None  . Highest education level: None  Social Needs  . Financial resource strain: None  . Food insecurity - worry: None  . Food insecurity - inability: None  . Transportation needs - medical: None  . Transportation needs - non-medical: None  Occupational History  . None  Tobacco Use  . Smoking  status: Never Smoker  . Smokeless tobacco: Never Used  Substance and Sexual Activity  . Alcohol use: No    Alcohol/week: 0.0 oz  . Drug use: No  . Sexual activity: No  Other Topics Concern  . None  Social History Narrative  . None    Outpatient Encounter Medications as of 11/10/2016  Medication Sig  . acetaminophen (TYLENOL) 500 MG tablet Take 1,000 mg by mouth every 6 (six) hours as needed for mild pain.  Marland Kitchen aspirin EC 81 MG tablet Take 81 mg by mouth daily. Has stopped prior to procedure  . BD ULTRA-FINE LANCETS lancets Test blood glucose BID DX code 250.00  . bismuth subsalicylate (PEPTO BISMOL) 262 MG chewable tablet Chew 524 mg by mouth as needed.  . Calcium Carbonate-Vitamin D (CALCIUM-VITAMIN D) 500-200 MG-UNIT tablet Take 1 tablet by mouth daily.  Marland Kitchen glimepiride (AMARYL) 2 MG tablet TAKE ONE TABLET BY MOUTH TWICE DAILY  . glimepiride (AMARYL) 2 MG tablet Take 1 tablet (2 mg total) by mouth 2 (two) times daily.  Marland Kitchen glucose blood (ONE TOUCH ULTRA TEST) test strip Test blood sugar BID Dx Code:  250.00 Pt uses Contour  Meter  . HYDROcodone-acetaminophen (NORCO) 5-325 MG tablet Take 1-2 tablets by mouth every 4 (four) hours as needed for moderate pain.  Marland Kitchen levothyroxine (SYNTHROID, LEVOTHROID) 150 MCG tablet Take 1 tablet (150 mcg total) by mouth daily.  Marland Kitchen LORazepam (ATIVAN) 1 MG tablet TAKE 1 TABLET BY MOUTH ONCE DAILY AS NEEDED FOR ANXIETY  . losartan (COZAAR) 100 MG tablet Take 1 tablet (100 mg total) by mouth daily.  . metFORMIN (GLUCOPHAGE) 1000 MG tablet Take 1 tablet (1,000 mg total) by mouth 2 (two) times daily with a meal.  . metoprolol succinate (TOPROL-XL) 25 MG 24 hr tablet TAKE 1 TABLET BY MOUTH TWICE DAILY  . naproxen (NAPROSYN) 500 MG tablet Take 1 tablet (500 mg total) by mouth 2 (two) times daily with a meal.  . pantoprazole (PROTONIX) 40 MG tablet TAKE 1 TABLET BY MOUTH TWICE DAILY *OFFICE  VISIT  NEEDED  FOR  FURTHER  REFILLS*  . venlafaxine XR (EFFEXOR-XR) 150 MG 24  hr capsule TAKE 1 CAPSULE BY MOUTH ONCE DAILY WITH BREAKFAST. HAS TO KEEP FOLLOW UP APPOINTMENT FOR MORE REFILLS.  Marland Kitchen vitamin C (ASCORBIC ACID) 500 MG tablet Take 500 mg by mouth daily.  . [DISCONTINUED] atorvastatin (LIPITOR) 10 MG tablet Take 1 tablet (10 mg total) by mouth daily.   No facility-administered encounter medications on file as of 11/10/2016.     Review of Systems  Constitutional: Negative for appetite change and unexpected weight change.  HENT: Negative for congestion and sinus pressure.   Respiratory: Negative for cough, chest tightness and shortness of breath.   Cardiovascular: Negative for chest pain, palpitations and leg swelling.  Gastrointestinal: Negative for abdominal pain, diarrhea, nausea and vomiting.  Genitourinary: Negative for difficulty urinating and dysuria.  Musculoskeletal: Negative for back pain and joint swelling.  Skin: Negative for color change and rash.  Neurological: Negative for dizziness, light-headedness and headaches.  Psychiatric/Behavioral: Negative for agitation and dysphoric mood.       Handling stress.        Objective:    Physical Exam  Constitutional: She appears well-developed and well-nourished. No distress.  HENT:  Nose: Nose normal.  Mouth/Throat: Oropharynx is clear and moist.  Neck: Neck supple. No thyromegaly present.  Cardiovascular: Normal rate and regular rhythm.  Pulmonary/Chest: Breath sounds normal. No respiratory distress. She has no wheezes.  Abdominal: Soft. Bowel sounds are normal. There is no tenderness.  Musculoskeletal: She exhibits no edema or tenderness.  Feet:  Sensation intact to pinprick and light touch.  No lesions.    Lymphadenopathy:    She has no cervical adenopathy.  Skin: No rash noted. No erythema.  Psychiatric: She has a normal mood and affect. Her behavior is normal.    BP 124/84 (BP Location: Left Arm, Patient Position: Sitting, Cuff Size: Large)   Pulse (!) 104   Temp 98.9 F (37.2 C)  (Oral)   Resp 18   Ht 5' 6.14" (1.68 m)   Wt 203 lb 9.6 oz (92.4 kg)   SpO2 97%   BMI 32.72 kg/m  Wt Readings from Last 3 Encounters:  11/10/16 203 lb 9.6 oz (92.4 kg)  12/10/15 210 lb 12.8 oz (95.6 kg)  11/19/15 210 lb (95.3 kg)     Lab Results  Component Value Date   WBC 6.6 11/10/2016   HGB 13.9 11/10/2016   HCT 40.9 11/10/2016   PLT 222.0 11/10/2016   GLUCOSE 180 (H) 11/10/2016   CHOL 212 (H) 11/10/2016   TRIG 178.0 (H) 11/10/2016  HDL 49.10 11/10/2016   LDLDIRECT 109.9 11/01/2012   LDLCALC 127 (H) 11/10/2016   ALT 32 11/10/2016   AST 46 (H) 11/10/2016   NA 139 11/10/2016   K 4.4 11/10/2016   CL 105 11/10/2016   CREATININE 1.06 11/10/2016   BUN 18 11/10/2016   CO2 26 11/10/2016   TSH 15.69 (H) 11/10/2016   HGBA1C 7.1 (H) 11/10/2016   MICROALBUR 0.9 11/10/2016       Assessment & Plan:   Problem List Items Addressed This Visit    Diabetes ([place]) - Primary    Low carb diet and exercise.  Follow met b and a1c.  Sugars per her report doing well.  Discussed the need to keep regular eye checks.        Relevant Orders   Hemoglobin A1c (Completed)   Microalbumin / creatinine urine ratio (Completed)   POCT Glucose (CBG) (Completed)   Essential hypertension, benign    Blood pressure under good control.  Continue same medication regimen.  Follow pressures.  Follow metabolic panel.        Relevant Orders   CBC with Differential/Platelet (Completed)   Basic metabolic panel (Completed)   GERD (gastroesophageal reflux disease)    Controlled on current regimen.        History of breast cancer    Saw Dr Tamala Julian last year.  Biopsy benign.  Schedule f/u mammogram.       Hypercholesterolemia    On lipitor.  Low cholesterol diet and exercise.  Follow lipid panel and liver function tests.        Relevant Orders   Hepatic function panel (Completed)   Lipid panel (Completed)   Hypothyroidism    On thyroid replacement.  Follow tsh.        Relevant Orders   TSH  (Completed)   Mild depression ([place])    Doing well on current regimen.  Follow.         Other Visit Diagnoses    Encounter for immunization       Relevant Orders   Flu vaccine HIGH DOSE PF (Completed)   Need for 23-polyvalent pneumococcal polysaccharide vaccine       Relevant Orders   Pneumococcal polysaccharide vaccine 23-valent greater than or equal to 2yo subcutaneous/IM (Completed)       Einar Pheasant, MD

## 2016-11-11 ENCOUNTER — Other Ambulatory Visit: Payer: Self-pay | Admitting: Internal Medicine

## 2016-11-11 DIAGNOSIS — E119 Type 2 diabetes mellitus without complications: Secondary | ICD-10-CM

## 2016-11-11 DIAGNOSIS — D7589 Other specified diseases of blood and blood-forming organs: Secondary | ICD-10-CM

## 2016-11-11 NOTE — Progress Notes (Signed)
Opened in error

## 2016-11-13 ENCOUNTER — Encounter: Payer: Self-pay | Admitting: Internal Medicine

## 2016-11-13 NOTE — Assessment & Plan Note (Signed)
Blood pressure under good control.  Continue same medication regimen.  Follow pressures.  Follow metabolic panel.   

## 2016-11-13 NOTE — Assessment & Plan Note (Signed)
Low carb diet and exercise.  Follow met b and a1c.  Sugars per her report doing well.  Discussed the need to keep regular eye checks.

## 2016-11-13 NOTE — Assessment & Plan Note (Signed)
On thyroid replacement.  Follow tsh.  

## 2016-11-13 NOTE — Assessment & Plan Note (Signed)
On lipitor.  Low cholesterol diet and exercise.  Follow lipid panel and liver function tests.   

## 2016-11-13 NOTE — Assessment & Plan Note (Signed)
Controlled on current regimen.   

## 2016-11-13 NOTE — Assessment & Plan Note (Signed)
Doing well on current regimen.  Follow.   

## 2016-11-13 NOTE — Assessment & Plan Note (Signed)
Saw Dr Tamala Julian last year.  Biopsy benign.  Schedule f/u mammogram.

## 2016-11-15 ENCOUNTER — Other Ambulatory Visit: Payer: Self-pay | Admitting: Internal Medicine

## 2016-11-16 ENCOUNTER — Encounter: Payer: Medicare Other | Admitting: Internal Medicine

## 2016-11-16 ENCOUNTER — Telehealth: Payer: Self-pay | Admitting: Internal Medicine

## 2016-11-16 ENCOUNTER — Other Ambulatory Visit: Payer: Self-pay

## 2016-11-16 MED ORDER — LEVOTHYROXINE SODIUM 175 MCG PO TABS: 150.0000 ug | ORAL_TABLET | Freq: Every day | ORAL | 1 refills | Status: DC

## 2016-11-16 NOTE — Telephone Encounter (Signed)
Ok to refill?  Last o/v 11/10/16 F/u 05/10/17

## 2016-11-16 NOTE — Telephone Encounter (Signed)
Copied from [place] [id number]. Topic: Inquiry >> Nov 16, 2016  2:46 PM Oliver Pila B wrote: Reason for CRM: Son(POA) of PT called to let Dr. Nicki Reaper aware that they want the ultrasound scheduled for the PT and if she can have her synthroid dosage increased and to have it called in to he pharmacy, contact Hilda Lias for any questions, or have pharmacy contact pt when Rx is ready

## 2016-11-16 NOTE — Telephone Encounter (Signed)
See lab notes for further documentation.

## 2016-11-17 ENCOUNTER — Other Ambulatory Visit: Payer: Self-pay | Admitting: Internal Medicine

## 2016-11-17 DIAGNOSIS — R7989 Other specified abnormal findings of blood chemistry: Secondary | ICD-10-CM

## 2016-11-17 DIAGNOSIS — E039 Hypothyroidism, unspecified: Secondary | ICD-10-CM

## 2016-11-17 DIAGNOSIS — F320 Major depressive disorder, single episode, mild: Secondary | ICD-10-CM

## 2016-11-17 DIAGNOSIS — D7589 Other specified diseases of blood and blood-forming organs: Secondary | ICD-10-CM

## 2016-11-17 DIAGNOSIS — E1140 Type 2 diabetes mellitus with diabetic neuropathy, unspecified: Secondary | ICD-10-CM

## 2016-11-17 DIAGNOSIS — F32A Depression, unspecified: Secondary | ICD-10-CM

## 2016-11-17 DIAGNOSIS — R945 Abnormal results of liver function studies: Secondary | ICD-10-CM

## 2016-11-17 NOTE — Telephone Encounter (Signed)
rx ok'd for meds requested.

## 2016-11-17 NOTE — Progress Notes (Signed)
Orders placed for f/u labs and ultrasound.

## 2016-11-23 ENCOUNTER — Ambulatory Visit: Payer: Medicare Other

## 2016-12-01 ENCOUNTER — Ambulatory Visit: Payer: Medicare Other

## 2016-12-22 IMAGING — MG MM BREAST LOCALIZATION CLIP
4 series · 4 of 4 positions shown · non-contrast
Comparison: Previous exam(s).

CLINICAL DATA: Status post ultrasound-guided core needle biopsy
left breast mass.

EXAM:
DIAGNOSTIC LEFT MAMMOGRAM POST ULTRASOUND BIOPSY

[L CC (1 of 2)]
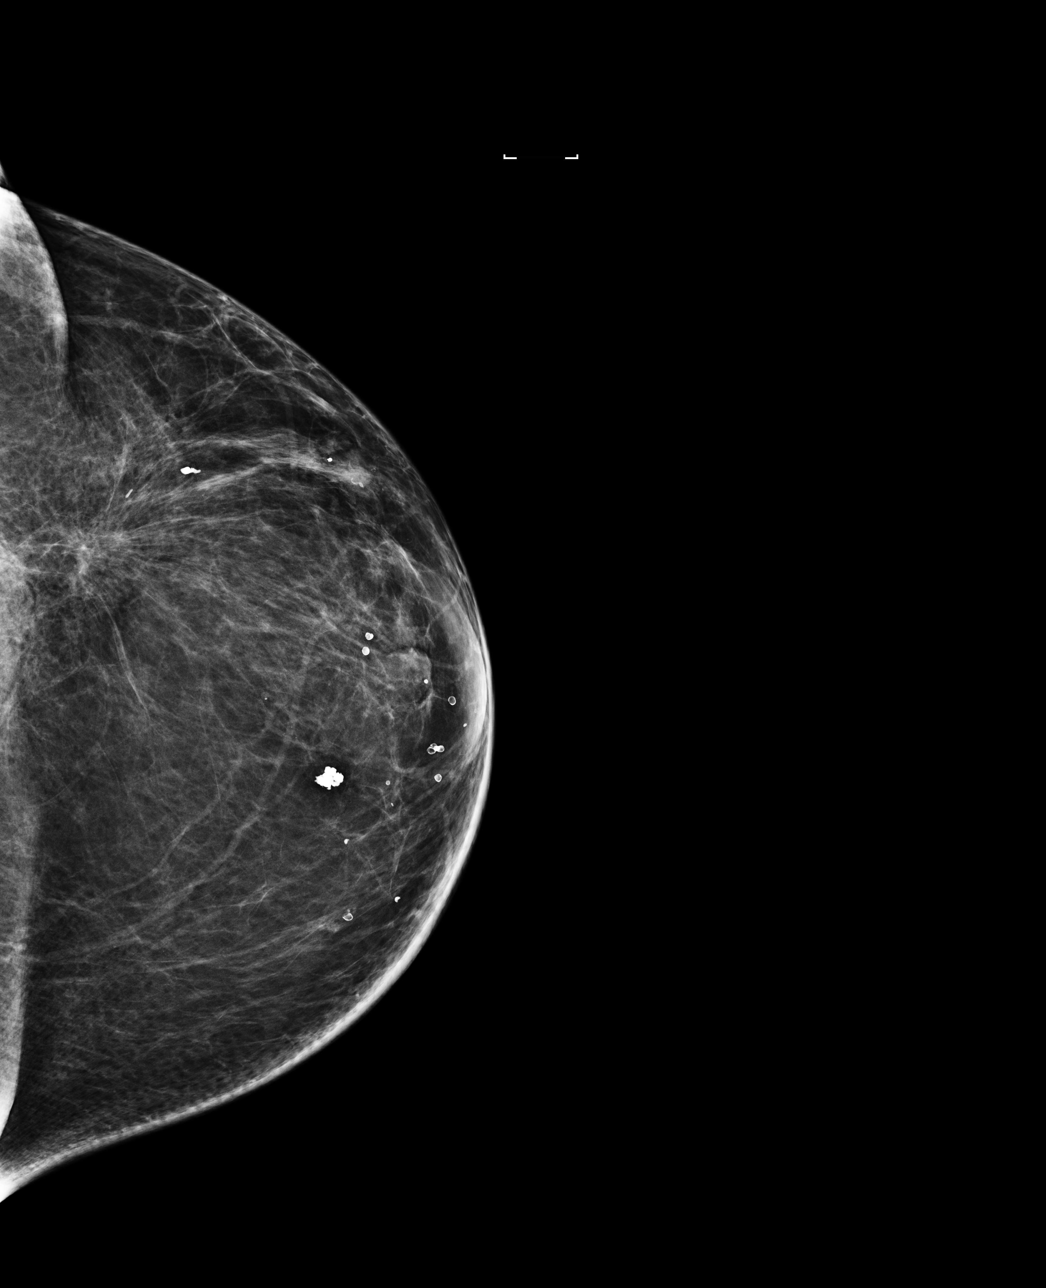

[L CC (2 of 2)]
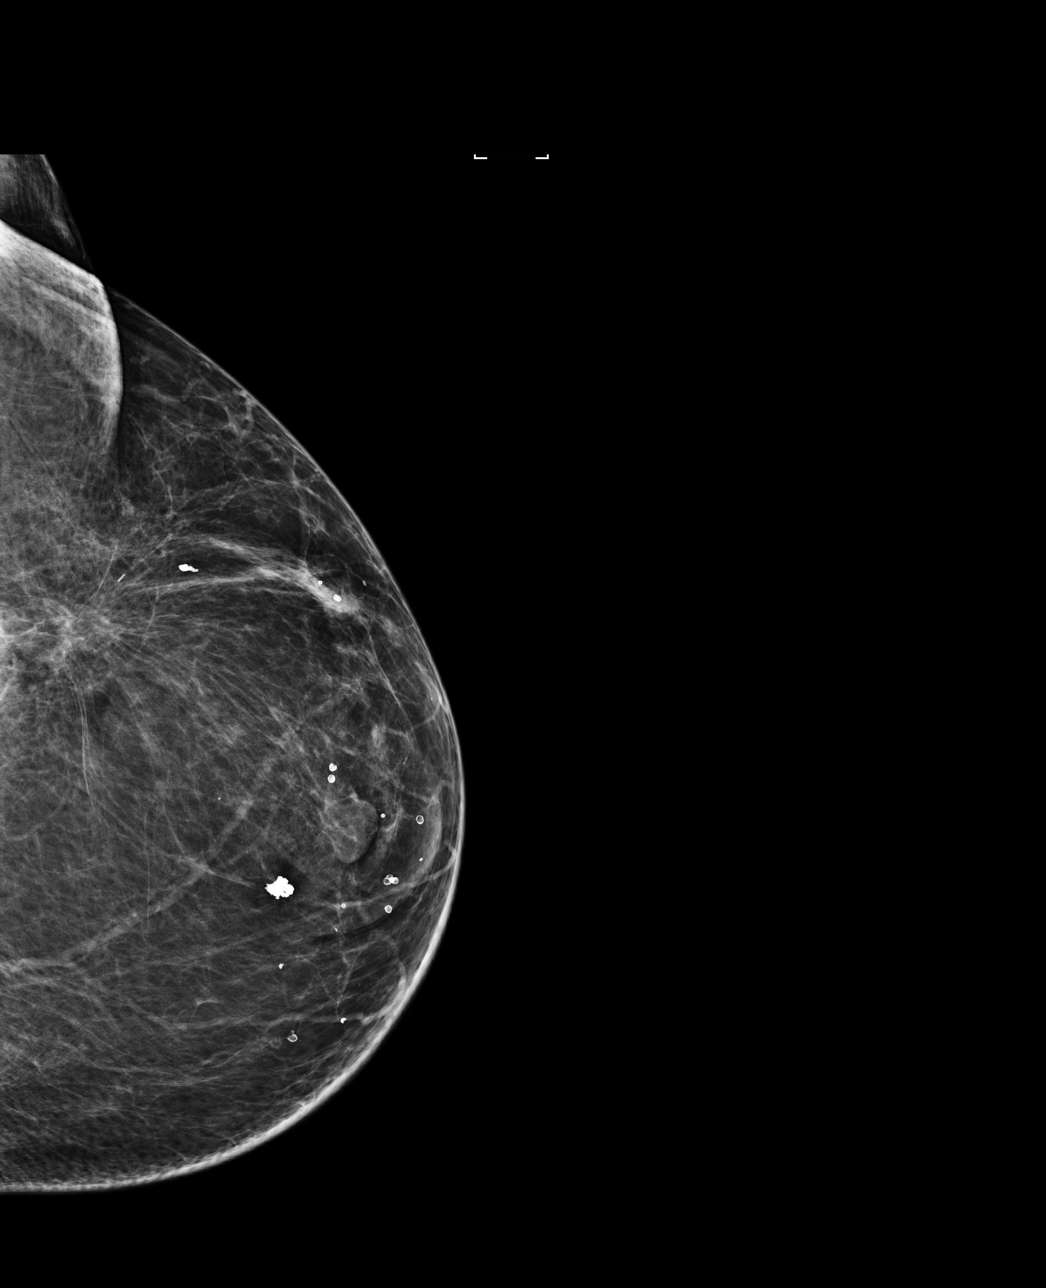

[L ML (1 of 2)]
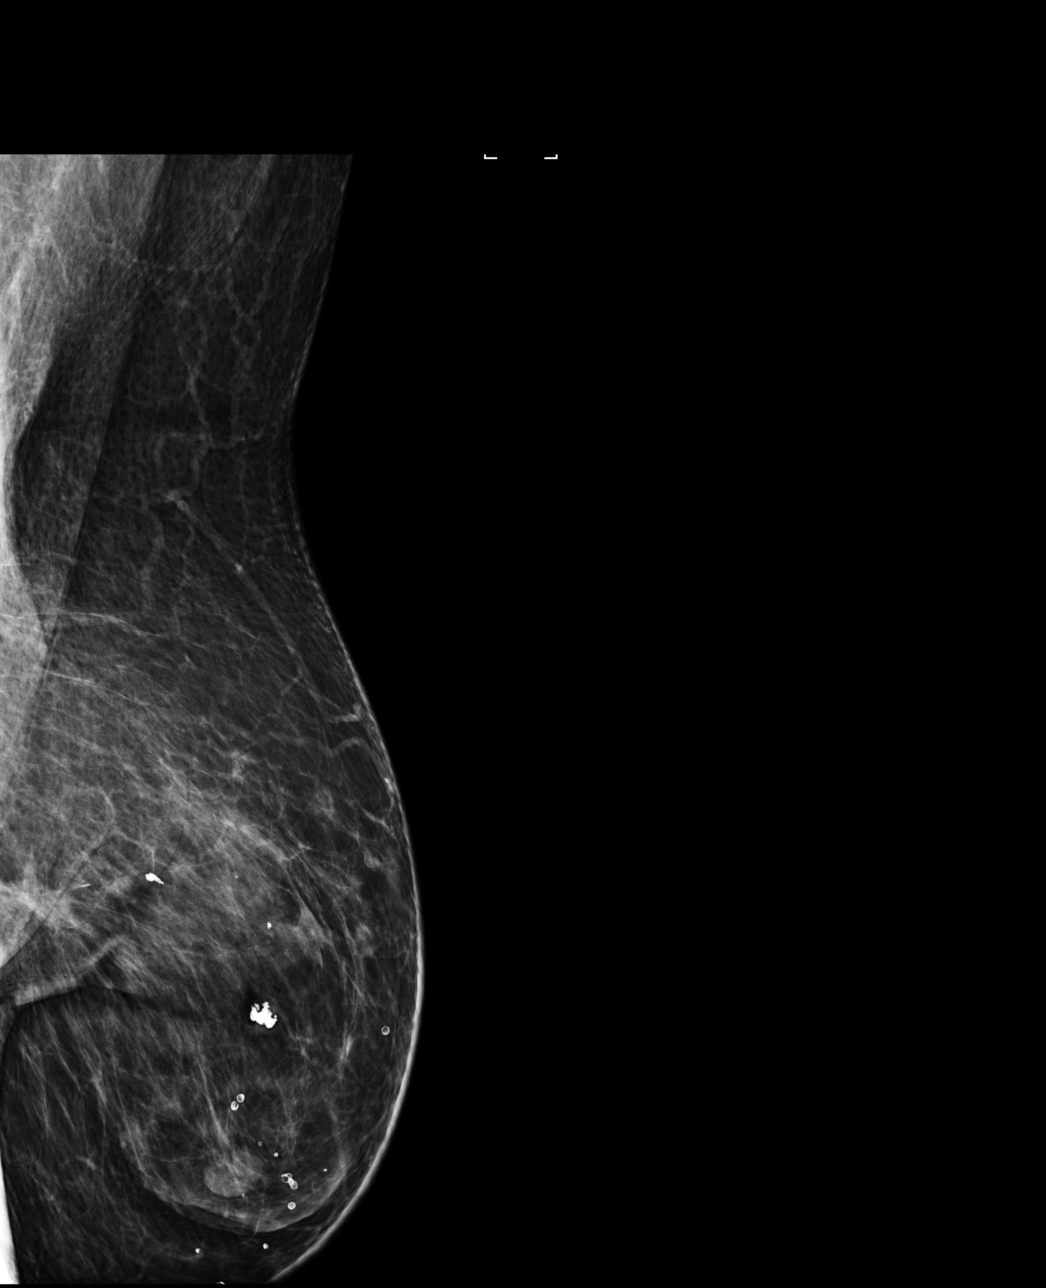

[L ML (2 of 2)]
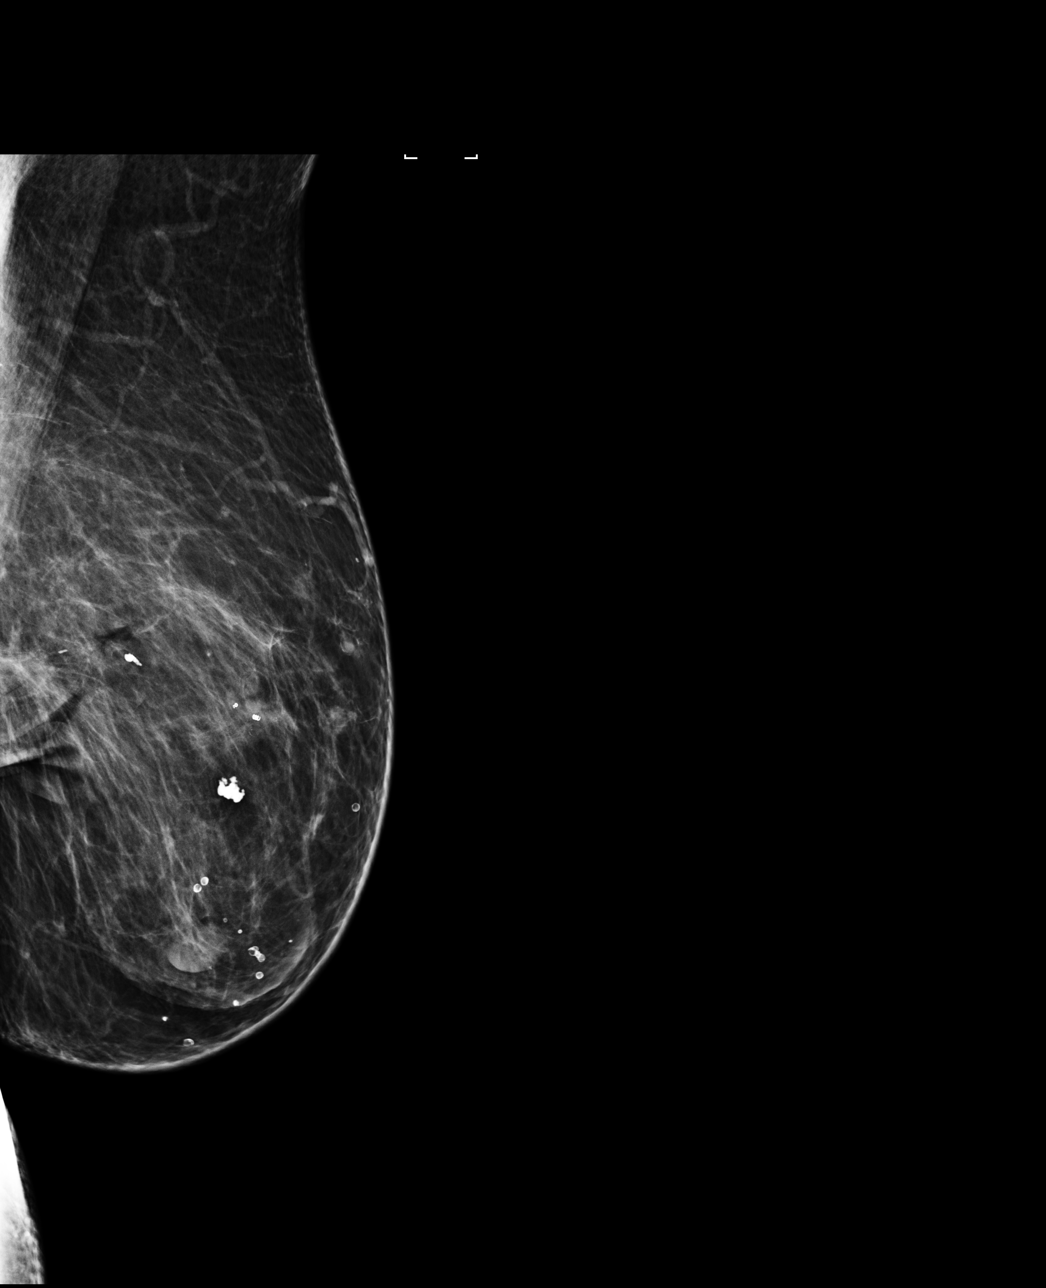

[4 of 4 positions shown; findings below may reference images not displayed]

FINDINGS: Mammographic images were obtained following ultrasound guided biopsy
of left breast mass. HydroMARK clip in appropriate position.
IMPRESSION: Appropriate position HydroMARK clip status post ultrasound-guided
core needle biopsy left breast mass.

Final Assessment: Post Procedure Mammograms for Marker Placement

## 2016-12-31 ENCOUNTER — Other Ambulatory Visit: Payer: Medicare Other

## 2017-01-12 ENCOUNTER — Encounter: Payer: Medicare Other | Admitting: Internal Medicine

## 2017-01-12 ENCOUNTER — Other Ambulatory Visit: Payer: Self-pay | Admitting: Internal Medicine

## 2017-02-23 ENCOUNTER — Other Ambulatory Visit: Payer: Self-pay | Admitting: Internal Medicine

## 2017-02-24 NOTE — Telephone Encounter (Signed)
Last OV 11/10/16 Next OV 05/10/17 Last refill 11/17/16

## 2017-02-24 NOTE — Telephone Encounter (Signed)
ok'd lorazepam #30 with 1 refill.

## 2017-02-25 ENCOUNTER — Other Ambulatory Visit: Payer: Self-pay

## 2017-02-25 MED ORDER — METOPROLOL SUCCINATE ER 25 MG PO TB24: ORAL_TABLET | ORAL | 1 refills | Status: DC

## 2017-02-25 MED ORDER — PANTOPRAZOLE SODIUM 40 MG PO TBEC: 40.0000 mg | DELAYED_RELEASE_TABLET | Freq: Two times a day (BID) | ORAL | 4 refills | Status: DC

## 2017-02-26 NOTE — Telephone Encounter (Signed)
Faxed Rx to [facility]

## 2017-03-17 ENCOUNTER — Other Ambulatory Visit: Payer: Self-pay | Admitting: Internal Medicine

## 2017-04-21 ENCOUNTER — Encounter: Payer: Self-pay | Admitting: Internal Medicine

## 2017-05-10 ENCOUNTER — Encounter: Payer: Medicare Other | Admitting: Internal Medicine

## 2017-05-21 ENCOUNTER — Other Ambulatory Visit: Payer: Self-pay | Admitting: Internal Medicine

## 2017-06-11 ENCOUNTER — Other Ambulatory Visit: Payer: Self-pay | Admitting: Internal Medicine

## 2017-06-11 NOTE — Telephone Encounter (Signed)
Needs an earlier appt.  Once scheduled can refill until appt.  Will need to keep this appt - I cannot continue to refill if I don't see her.

## 2017-06-11 NOTE — Telephone Encounter (Signed)
Last seen in November 2018. Has cancelled or no showed the last 3-4 appt. NOV: 9/19

## 2017-06-14 NOTE — Telephone Encounter (Signed)
Able to working in prior to 09/2017? No appts avail. Please advise

## 2017-06-14 NOTE — Telephone Encounter (Signed)
Left message to schedule appt

## 2017-06-16 NOTE — Telephone Encounter (Signed)
Patient scheduled for 07/13/17. OK to fill meds for 30 days

## 2017-06-16 NOTE — Telephone Encounter (Signed)
[place] for 30 day supply only.  Must keep next appt.

## 2017-07-13 ENCOUNTER — Encounter: Payer: Self-pay | Admitting: Internal Medicine

## 2017-07-13 ENCOUNTER — Ambulatory Visit (INDEPENDENT_AMBULATORY_CARE_PROVIDER_SITE_OTHER): Payer: Medicare Other | Admitting: Internal Medicine

## 2017-07-13 DIAGNOSIS — Z853 Personal history of malignant neoplasm of breast: Secondary | ICD-10-CM

## 2017-07-13 DIAGNOSIS — I10 Essential (primary) hypertension: Secondary | ICD-10-CM | POA: Diagnosis not present

## 2017-07-13 DIAGNOSIS — L989 Disorder of the skin and subcutaneous tissue, unspecified: Secondary | ICD-10-CM

## 2017-07-13 DIAGNOSIS — E039 Hypothyroidism, unspecified: Secondary | ICD-10-CM

## 2017-07-13 DIAGNOSIS — E7800 Pure hypercholesterolemia, unspecified: Secondary | ICD-10-CM

## 2017-07-13 DIAGNOSIS — F32A Depression, unspecified: Secondary | ICD-10-CM

## 2017-07-13 DIAGNOSIS — Z1211 Encounter for screening for malignant neoplasm of colon: Secondary | ICD-10-CM

## 2017-07-13 DIAGNOSIS — Z1231 Encounter for screening mammogram for malignant neoplasm of breast: Secondary | ICD-10-CM | POA: Diagnosis not present

## 2017-07-13 DIAGNOSIS — K219 Gastro-esophageal reflux disease without esophagitis: Secondary | ICD-10-CM

## 2017-07-13 DIAGNOSIS — Z1239 Encounter for other screening for malignant neoplasm of breast: Secondary | ICD-10-CM

## 2017-07-13 DIAGNOSIS — F320 Major depressive disorder, single episode, mild: Secondary | ICD-10-CM

## 2017-07-13 DIAGNOSIS — E1140 Type 2 diabetes mellitus with diabetic neuropathy, unspecified: Secondary | ICD-10-CM

## 2017-07-13 LAB — LIPID PANEL
CHOL/HDL RATIO: 4
Cholesterol: 184 mg/dL (ref 0–200)
HDL: 51.8 mg/dL (ref >39.00)
LDL Cholesterol: 106 mg/dL — ABNORMAL HIGH (ref 0–99)
NONHDL: 131.71
Triglycerides: 129 mg/dL (ref 0.0–149.0)
VLDL: 25.8 mg/dL (ref 0.0–40.0)

## 2017-07-13 LAB — BASIC METABOLIC PANEL
BUN: 14 mg/dL (ref 6–23)
CALCIUM: 9.4 mg/dL (ref 8.4–10.5)
CO2: 27 mEq/L (ref 19–32)
CREATININE: 0.92 mg/dL (ref 0.40–1.20)
Chloride: 104 mEq/L (ref 96–112)
GFR: 62.91 mL/min (ref >60.00)
Glucose, Bld: 229 mg/dL — ABNORMAL HIGH (ref 70–99)
Potassium: 4.1 mEq/L (ref 3.5–5.1)
Sodium: 140 mEq/L (ref 135–145)

## 2017-07-13 LAB — HEMOGLOBIN A1C: HEMOGLOBIN A1C: 8.1 % — AB (ref 4.6–6.5)

## 2017-07-13 LAB — CBC WITH DIFFERENTIAL/PLATELET
Basophils Absolute: 0 10*3/uL (ref 0.0–0.1)
Basophils Relative: 0.6 % (ref 0.0–3.0)
EOS PCT: 2.3 % (ref 0.0–5.0)
Eosinophils Absolute: 0.2 10*3/uL (ref 0.0–0.7)
HCT: 42.5 % (ref 36.0–46.0)
HEMOGLOBIN: 14.4 g/dL (ref 12.0–15.0)
LYMPHS ABS: 1.8 10*3/uL (ref 0.7–4.0)
Lymphocytes Relative: 25.3 % (ref 12.0–46.0)
MCHC: 34 g/dL (ref 30.0–36.0)
MCV: 99.9 fl (ref 78.0–100.0)
MONO ABS: 0.5 10*3/uL (ref 0.1–1.0)
MONOS PCT: 7.4 % (ref 3.0–12.0)
NEUTROS PCT: 64.4 % (ref 43.0–77.0)
Neutro Abs: 4.6 10*3/uL (ref 1.4–7.7)
Platelets: 210 10*3/uL (ref 150.0–400.0)
RBC: 4.25 Mil/uL (ref 3.87–5.11)
RDW: 13.9 % (ref 11.5–15.5)
WBC: 7.1 10*3/uL (ref 4.0–10.5)

## 2017-07-13 LAB — HEPATIC FUNCTION PANEL
ALK PHOS: 56 U/L (ref 39–117)
ALT: 31 U/L (ref 0–35)
AST: 36 U/L (ref 0–37)
Albumin: 4 g/dL (ref 3.5–5.2)
Bilirubin, Direct: 0.1 mg/dL (ref 0.0–0.3)
Total Bilirubin: 0.6 mg/dL (ref 0.2–1.2)
Total Protein: 7.7 g/dL (ref 6.0–8.3)

## 2017-07-13 LAB — TSH: TSH: 5.12 u[IU]/mL — ABNORMAL HIGH (ref 0.35–4.50)

## 2017-07-13 MED ORDER — METFORMIN HCL ER 500 MG PO TB24: ORAL_TABLET | ORAL | 3 refills | Status: DC

## 2017-07-13 NOTE — Progress Notes (Signed)
Pre-visit discussion using our clinic review tool. No additional management support is needed unless otherwise documented below in the visit note.  

## 2017-07-13 NOTE — Progress Notes (Signed)
[name], female   DOB: [birth year], 77 y.o.   MRN: [id number]   Subjective:    [name], female    DOB: 1940-09-23, 77 y.o.   MRN: [id number]  HPI  Patient here for a scheduled follow up.  Does not keep regular appts.  She is accompanied by her son.  History obtained from both of them.  She reports she is doing relatively well.  Has noticed problems with constipation recently.  States has a bowel movement q 3 days.  Discussed treatment options.  Plans to start taking colace. No abdominal pain.  Eating.  No nausea or vomiting.  No chest pain.  Breathing stable.  Has had some issues with dizziness.  Notices intermittently.  Question if positional.  Feels is better than previous.  Discussed further w/up and evaluation.  She declines.  No headache.  Persistent left leg lesion.  Request referral to dermatology.  Concern regarding some memory change.  Discussed with her today.  Discussed further w/up.     Past Medical History:  Diagnosis Date  . Breast cancer ([place]) 2005   s/p chemotherapy and XRT  . Depression   . Diabetes ([place])   . GERD (gastroesophageal reflux disease)   . Hx of vertigo   . Hypercholesterolemia   . Hypertension   . Hypothyroidism   . Polio 1948   history  . PONV (postoperative nausea and vomiting)    Past Surgical History:  Procedure Laterality Date  . BREAST BIOPSY Left 2005  . BREAST BIOPSY Left 10/25/2015   US guided biopsy  . BREAST LUMPECTOMY Left 2005  . EXCISION OF BREAST LESION Left 11/19/2015   Procedure: EXCISION OF BREAST LESION/ MASS;  Surgeon: Leonie Green, MD;  Location: [facility] ORS;  Service: General;  Laterality: Left;  . EYE SURGERY     cataracts bilateral  . TUBAL LIGATION     Family History  Problem Relation Age of Onset  . Cancer Mother        breast  . Breast cancer Mother 71  . Stroke Father   . Breast cancer Maternal Aunt    Social History   Socioeconomic History  . Marital status: Married    Spouse  name: Not on file  . Number of children: Not on file  . Years of education: Not on file  . Highest education level: Not on file  Occupational History  . Not on file  Social Needs  . Financial resource strain: Not on file  . Food insecurity:    Worry: Not on file    Inability: Not on file  . Transportation needs:    Medical: Not on file    Non-medical: Not on file  Tobacco Use  . Smoking status: Never Smoker  . Smokeless tobacco: Never Used  Substance and Sexual Activity  . Alcohol use: No    Alcohol/week: 0.0 oz  . Drug use: No  . Sexual activity: Never  Lifestyle  . Physical activity:    Days per week: Not on file    Minutes per session: Not on file  . Stress: Not on file  Relationships  . Social connections:    Talks on phone: Not on file    Gets together: Not on file    Attends religious service: Not on file    Active member of club or organization: Not on file    Attends meetings of clubs or organizations: Not on file    Relationship  status: Not on file  Other Topics Concern  . Not on file  Social History Narrative  . Not on file    Outpatient Encounter Medications as of 07/13/2017  Medication Sig  . acetaminophen (TYLENOL) 500 MG tablet Take 1,000 mg by mouth every 6 (six) hours as needed for mild pain.  Marland Kitchen aspirin EC 81 MG tablet Take 81 mg by mouth daily. Has stopped prior to procedure  . BD ULTRA-FINE LANCETS lancets Test blood glucose BID DX code 250.00  . bismuth subsalicylate (PEPTO BISMOL) 262 MG chewable tablet Chew 524 mg by mouth as needed.  . Calcium Carbonate-Vitamin D (CALCIUM-VITAMIN D) 500-200 MG-UNIT tablet Take 1 tablet by mouth daily.  Marland Kitchen glimepiride (AMARYL) 2 MG tablet TAKE ONE TABLET BY MOUTH TWICE DAILY  . glimepiride (AMARYL) 2 MG tablet TAKE 1 TABLET BY MOUTH TWICE DAILY. NEEDS TO KEEP APPOINTMENT FOR FUTHER REFILLS.  Marland Kitchen glucose blood (ONE TOUCH ULTRA TEST) test strip Test blood sugar BID Dx Code:  250.00 Pt uses Contour Meter  .  HYDROcodone-acetaminophen (NORCO) 5-325 MG tablet Take 1-2 tablets by mouth every 4 (four) hours as needed for moderate pain.  Marland Kitchen levothyroxine (SYNTHROID, LEVOTHROID) 175 MCG tablet TAKE 1 TABLET BY MOUTH ONCE DAILY  . LORazepam (ATIVAN) 1 MG tablet TAKE 1 TABLET BY MOUTH ONCE DAILY AS NEEDED FOR ANXIETY  . losartan (COZAAR) 100 MG tablet TAKE 1 TABLET BY MOUTH ONCE DAILY  . metoprolol succinate (TOPROL-XL) 25 MG 24 hr tablet TAKE 1 TABLET BY MOUTH TWICE DAILY. NEEDS TO KEEP NEXT APPOINTMENT FOR FURTHER REFILLS.  Marland Kitchen metoprolol succinate (TOPROL-XL) 25 MG 24 hr tablet TAKE 1 TABLET BY MOUTH TWICE DAILY. NEEDS TO KEEP NEXT APPOINTMENT FOR FURTHER REFILLS.  Marland Kitchen pantoprazole (PROTONIX) 40 MG tablet Take 1 tablet (40 mg total) by mouth 2 (two) times daily before a meal.  . venlafaxine XR (EFFEXOR-XR) 150 MG 24 hr capsule TAKE 1 CAPSULE BY MOUTH ONCE DAILY WITH BREAKFAST. NEEDS TO KEEP NEXT APPOINTMENT FOR FURTHER REFILLS.  Marland Kitchen vitamin C (ASCORBIC ACID) 500 MG tablet Take 500 mg by mouth daily.  . [DISCONTINUED] metFORMIN (GLUCOPHAGE) 1000 MG tablet TAKE 1 TABLET BY MOUTH TWICE DAILY WITH A MEAL  . metFORMIN (GLUCOPHAGE XR) 500 MG 24 hr tablet Two tablets bid  . [DISCONTINUED] naproxen (NAPROSYN) 500 MG tablet Take 1 tablet (500 mg total) by mouth 2 (two) times daily with a meal. (Patient not taking: Reported on 07/13/2017)   No facility-administered encounter medications on file as of 07/13/2017.     Review of Systems  Constitutional: Negative for appetite change and unexpected weight change.  HENT: Negative for congestion and sinus pressure.   Respiratory: Negative for cough, chest tightness and shortness of breath.   Cardiovascular: Negative for chest pain, palpitations and leg swelling.  Gastrointestinal: Negative for abdominal pain, diarrhea and nausea.  Genitourinary: Negative for difficulty urinating and dysuria.  Musculoskeletal: Negative for joint swelling and myalgias.  Skin: Negative for color  change and rash.  Neurological: Positive for dizziness. Negative for headaches.  Psychiatric/Behavioral: Negative for agitation and dysphoric mood.       Objective:     Blood pressure rechecked by me:  134/78  Physical Exam  Constitutional: She is oriented to person, place, and time. She appears well-developed and well-nourished. No distress.  HENT:  Nose: Nose normal.  Mouth/Throat: Oropharynx is clear and moist.  Eyes: Right eye exhibits no discharge. Left eye exhibits no discharge. No scleral icterus.  Neck: Neck supple. No thyromegaly  present.  Cardiovascular: Normal rate and regular rhythm.  Pulmonary/Chest: Breath sounds normal. No accessory muscle usage. No tachypnea. No respiratory distress. She has no decreased breath sounds. She has no wheezes. She has no rhonchi. Right breast exhibits no inverted nipple, no mass, no nipple discharge and no tenderness (no axillary adenopathy). Left breast exhibits no inverted nipple, no mass, no nipple discharge and no tenderness (no axilarry adenopathy).  Abdominal: Soft. Bowel sounds are normal. There is no tenderness.  Musculoskeletal: She exhibits no edema or tenderness.  Lymphadenopathy:    She has no cervical adenopathy.  Neurological: She is alert and oriented to person, place, and time.  Skin: No rash noted. No erythema.  Psychiatric: She has a normal mood and affect. Her behavior is normal.    BP 134/78   Pulse 86   Temp 98.2 F (36.8 C) (Oral)   Resp 18   Ht 5' 6"  (1.676 m)   Wt 205 lb 6 oz (93.2 kg)   SpO2 97%   BMI 33.15 kg/m  Wt Readings from Last 3 Encounters:  07/13/17 205 lb 6 oz (93.2 kg)  11/10/16 203 lb 9.6 oz (92.4 kg)  12/10/15 210 lb 12.8 oz (95.6 kg)     Lab Results  Component Value Date   WBC 7.1 07/13/2017   HGB 14.4 07/13/2017   HCT 42.5 07/13/2017   PLT 210.0 07/13/2017   GLUCOSE 229 (H) 07/13/2017   CHOL 184 07/13/2017   TRIG 129.0 07/13/2017   HDL 51.80 07/13/2017   LDLDIRECT 109.9  11/01/2012   LDLCALC 106 (H) 07/13/2017   ALT 31 07/13/2017   AST 36 07/13/2017   NA 140 07/13/2017   K 4.1 07/13/2017   CL 104 07/13/2017   CREATININE 0.92 07/13/2017   BUN 14 07/13/2017   CO2 27 07/13/2017   TSH 5.12 (H) 07/13/2017   HGBA1C 8.1 (H) 07/13/2017   MICROALBUR 0.9 11/10/2016       Assessment & Plan:   Problem List Items Addressed This Visit    Diabetes ([place])    Low carb diet and exercise.  Follow met b and a1c.  She has not been taking her medication as prescribed.  Discussed the importance of taking her meds on as ordered.  Follow.  Change to metformin XR to help her swallow, etc.        Relevant Medications   metFORMIN (GLUCOPHAGE XR) 500 MG 24 hr tablet   Other Relevant Orders   Hemoglobin A1c (Completed)   Basic metabolic panel (Completed)   Essential hypertension, benign    Blood pressure has been under good control.  Continue same medication regimen.  Follow pressures.  Follow metabolic panel.        GERD (gastroesophageal reflux disease)    Controlled on current regimen.  Follow.        History of breast cancer    Schedule mammogram.        Hypercholesterolemia    Has been on lipitor.  Apparently not taking regularly.  Low cholesterol diet and exercise.  Follow lipid panel and liver function tests.        Relevant Orders   Hepatic function panel (Completed)   Lipid panel (Completed)   Hypothyroidism    On thyroid replacement.  Follow tsh.        Relevant Orders   CBC with Differential/Platelet (Completed)   TSH (Completed)   Mild depression ([place])    Doing well on current regimen.  Follow.  Other Visit Diagnoses    Breast cancer screening       Relevant Orders   MM 3D SCREEN BREAST BILATERAL   Colon cancer screening       Overdue.  She has declined previously.  Declines colonoscopy.  Agred to cologuard. Form completed.     Skin lesion of left leg       Persistent left leg lesion.  Refer to dermatology.    Relevant Orders    Ambulatory referral to Dermatology      I spent 40 minutes with the patient and more than 50% of the time was spent in consultation regarding the above.  Time spent discussing current symptoms and concerns as well as importance of compliance with medications and appts.  Also discussed plans for further evaluation and treatment.    Einar Pheasant, MD

## 2017-07-14 ENCOUNTER — Telehealth: Payer: Self-pay | Admitting: *Deleted

## 2017-07-14 MED ORDER — ROSUVASTATIN CALCIUM 20 MG PO TABS: 20.0000 mg | ORAL_TABLET | Freq: Every day | ORAL | 1 refills | Status: DC

## 2017-07-14 NOTE — Telephone Encounter (Signed)
Crestor sent to pharmacy, See related result note.

## 2017-07-14 NOTE — Telephone Encounter (Signed)
-----   Message from Einar Pheasant, MD sent at 07/14/2017  5:56 AM EDT ----- Please call [name] and notify that Ms Hanke's cholesterol has improved.  Was on lipitor.  Do not see on her list now.  If not on any cholesterol medication, need to restart - given her calculated cholesterol risk.  If agreeable, start crestor 20mg  q day.  Will need liver panel checked 6 weeks after starting.  Please schedule non fasting lab appt.  Overall sugar control increased.  a1c 8.1.  At her visit, reported not taking medication as instructed.  Need to take on a regular schedule.  Also needs to check and record blood sugars and send in over the next few weeks.  May need to add another medication.  tsh is slightly elevated.  Need to make sure she is taking correctly and daily.  Will need to follow.  Recheck tsh in 6 weeks with liver panel check.  hgb , kidney function tests and liver function tests are wnl.

## 2017-07-15 ENCOUNTER — Other Ambulatory Visit: Payer: Self-pay | Admitting: Internal Medicine

## 2017-07-15 ENCOUNTER — Telehealth: Payer: Self-pay | Admitting: Internal Medicine

## 2017-07-15 DIAGNOSIS — R413 Other amnesia: Secondary | ICD-10-CM

## 2017-07-15 NOTE — Telephone Encounter (Signed)
Per note, atorvastatin was not on her list.  Per last lab result note, was started on crestor.  I would like for her to continue crestor.

## 2017-07-15 NOTE — Telephone Encounter (Signed)
I see where crestor is on med list, but I do not see atorvastatin. Please advise?

## 2017-07-15 NOTE — Telephone Encounter (Signed)
Please advise 

## 2017-07-15 NOTE — Progress Notes (Signed)
Order placed for B12

## 2017-07-15 NOTE — Telephone Encounter (Signed)
Copied from [place] [id number]. Topic: Quick Communication - See Telephone Encounter >> Jul 15, 2017  2:48 PM Rutherford Nail, NT wrote: CRM for notification. See Telephone encounter for: 07/15/17. [name], patient's son states that he was under the impression that the patient was not taking cholesterol medication. Would like a call to clarify what she should be taking? States that she has atorvastatin and Crestor, but is unsure if these need to be taken. CB#: [phone]

## 2017-07-15 NOTE — Telephone Encounter (Signed)
I spoke with patient's son [name] & clarified that she should only be taking the crestor. He also wanted to be sure that it was ok that she took B-12 vitamin. Patient feels it may help her memory even though her values are normal?

## 2017-07-15 NOTE — Telephone Encounter (Signed)
Ok to continue B12 for now.  The lab was not run.  We will check with her lab scheduled in 08/2017.  I have ordered.  Sorry for the inconvenience.

## 2017-07-16 NOTE — Telephone Encounter (Signed)
I called patient's son & LVM that B12 was ok to continue for now. B12 lab would be checked on 8/20.

## 2017-07-20 ENCOUNTER — Encounter: Payer: Self-pay | Admitting: Internal Medicine

## 2017-07-20 NOTE — Assessment & Plan Note (Signed)
Has been on lipitor.  Apparently not taking regularly.  Low cholesterol diet and exercise.  Follow lipid panel and liver function tests.

## 2017-07-20 NOTE — Assessment & Plan Note (Signed)
Blood pressure has been under good control.  Continue same medication regimen.  Follow pressures.  Follow metabolic panel.   

## 2017-07-20 NOTE — Assessment & Plan Note (Signed)
Doing well on current regimen.  Follow.   

## 2017-07-20 NOTE — Assessment & Plan Note (Signed)
Controlled on current regimen.  Follow.  

## 2017-07-20 NOTE — Assessment & Plan Note (Signed)
Schedule mammogram.

## 2017-07-20 NOTE — Assessment & Plan Note (Signed)
On thyroid replacement.  Follow tsh.  

## 2017-07-20 NOTE — Assessment & Plan Note (Signed)
Low carb diet and exercise.  Follow met b and a1c.  She has not been taking her medication as prescribed.  Discussed the importance of taking her meds on as ordered.  Follow.  Change to metformin XR to help her swallow, etc.

## 2017-08-26 ENCOUNTER — Other Ambulatory Visit: Payer: Medicare Other

## 2017-09-07 ENCOUNTER — Other Ambulatory Visit: Payer: Self-pay | Admitting: Internal Medicine

## 2017-09-07 ENCOUNTER — Encounter: Payer: Medicare Other | Admitting: Internal Medicine

## 2017-09-07 ENCOUNTER — Encounter: Payer: Self-pay | Admitting: Internal Medicine

## 2017-09-07 DIAGNOSIS — Z0289 Encounter for other administrative examinations: Secondary | ICD-10-CM

## 2017-09-07 NOTE — Telephone Encounter (Signed)
Need to know why she did not keep appt today.

## 2017-09-07 NOTE — Telephone Encounter (Signed)
Patient last seen 07/13/2017. Ok to refill Lorazepam?

## 2017-09-09 ENCOUNTER — Other Ambulatory Visit: Payer: Self-pay | Admitting: Internal Medicine

## 2017-09-09 NOTE — Telephone Encounter (Signed)
Patients son stated he was not aware that patient had an appt. She does need Lorazepam refilled. Last refill 06/17/17. Last office visit 07/13/17

## 2017-09-13 NOTE — Telephone Encounter (Signed)
faxed

## 2017-12-09 ENCOUNTER — Other Ambulatory Visit: Payer: Self-pay | Admitting: Internal Medicine

## 2017-12-10 ENCOUNTER — Encounter: Payer: Medicare Other | Admitting: Internal Medicine

## 2017-12-15 ENCOUNTER — Other Ambulatory Visit: Payer: Self-pay | Admitting: Internal Medicine

## 2017-12-16 ENCOUNTER — Other Ambulatory Visit: Payer: Self-pay

## 2017-12-16 MED ORDER — GLIMEPIRIDE 2 MG PO TABS: 2.0000 mg | ORAL_TABLET | Freq: Two times a day (BID) | ORAL | 0 refills | Status: DC

## 2017-12-16 MED ORDER — LOSARTAN POTASSIUM 100 MG PO TABS: 100.0000 mg | ORAL_TABLET | Freq: Every day | ORAL | 1 refills | Status: DC

## 2017-12-16 MED ORDER — PANTOPRAZOLE SODIUM 40 MG PO TBEC: 40.0000 mg | DELAYED_RELEASE_TABLET | Freq: Two times a day (BID) | ORAL | 4 refills | Status: DC

## 2017-12-16 MED ORDER — ROSUVASTATIN CALCIUM 20 MG PO TABS: 20.0000 mg | ORAL_TABLET | Freq: Every day | ORAL | 1 refills | Status: DC

## 2017-12-16 MED ORDER — METFORMIN HCL ER 500 MG PO TB24: ORAL_TABLET | ORAL | 3 refills | Status: DC

## 2017-12-16 NOTE — Telephone Encounter (Signed)
Does not have appt scheduled.  Needs to schedule appt and then will refill only until appt.  She has repeatedly missed appts.

## 2018-01-12 ENCOUNTER — Other Ambulatory Visit: Payer: Self-pay | Admitting: Internal Medicine

## 2018-01-14 NOTE — Telephone Encounter (Signed)
Need to know how many tablets she has left.  I am not going to refill her medication until appt made and will then only refill until her appt.  She has to keep the next appt to get any more refills.

## 2018-01-19 NOTE — Telephone Encounter (Signed)
Unable to reach patient. Mailbox full.

## 2018-03-21 ENCOUNTER — Other Ambulatory Visit: Payer: Self-pay | Admitting: Internal Medicine

## 2018-03-22 NOTE — Telephone Encounter (Signed)
I have sent in the prescription for one month. Please call and notify, I will not continue to refill if she does not schedule and keep appt.

## 2018-03-30 NOTE — Telephone Encounter (Signed)
Informed the pt's son that medication was sent to pharmacy and if any more refills was needed, pt needs a appt. Son stae he understood and would call back for appt.  Nina,cma

## 2018-05-30 ENCOUNTER — Other Ambulatory Visit: Payer: Self-pay | Admitting: Internal Medicine

## 2018-06-01 NOTE — Telephone Encounter (Signed)
Can schedule a virtual appt.  Ok to refill x 1, but needs appt scheduled and since virtual (video or telephone) should be able to do this week.  Will not be able to continue to refill if she is not seen.

## 2018-06-01 NOTE — Telephone Encounter (Signed)
Refilled: 03/22/2018 Last OV: 07/14/2018 Next OV: 08/09/2018

## 2018-06-02 NOTE — Telephone Encounter (Signed)
Pt has been scheduled for 12:00 tomorrow.

## 2018-06-03 ENCOUNTER — Encounter: Payer: Self-pay | Admitting: Internal Medicine

## 2018-06-03 ENCOUNTER — Ambulatory Visit (INDEPENDENT_AMBULATORY_CARE_PROVIDER_SITE_OTHER): Payer: Medicare HMO | Admitting: Internal Medicine

## 2018-06-03 ENCOUNTER — Other Ambulatory Visit: Payer: Self-pay

## 2018-06-03 DIAGNOSIS — F32A Depression, unspecified: Secondary | ICD-10-CM

## 2018-06-03 DIAGNOSIS — F320 Major depressive disorder, single episode, mild: Secondary | ICD-10-CM

## 2018-06-03 DIAGNOSIS — K219 Gastro-esophageal reflux disease without esophagitis: Secondary | ICD-10-CM | POA: Diagnosis not present

## 2018-06-03 DIAGNOSIS — Z1239 Encounter for other screening for malignant neoplasm of breast: Secondary | ICD-10-CM

## 2018-06-03 DIAGNOSIS — I10 Essential (primary) hypertension: Secondary | ICD-10-CM | POA: Diagnosis not present

## 2018-06-03 DIAGNOSIS — E1140 Type 2 diabetes mellitus with diabetic neuropathy, unspecified: Secondary | ICD-10-CM

## 2018-06-03 DIAGNOSIS — Z853 Personal history of malignant neoplasm of breast: Secondary | ICD-10-CM

## 2018-06-03 DIAGNOSIS — E039 Hypothyroidism, unspecified: Secondary | ICD-10-CM

## 2018-06-03 DIAGNOSIS — E7800 Pure hypercholesterolemia, unspecified: Secondary | ICD-10-CM

## 2018-06-03 NOTE — Progress Notes (Signed)
[name], female   DOB: [birth year], 78 y.o.   MRN: [id number]   Virtual Visit via telephone Note  This visit type was conducted due to national recommendations for restrictions regarding the COVID-19 pandemic (e.g. social distancing).  This format is felt to be most appropriate for this patient at this time.  All issues noted in this document were discussed and addressed.  No physical exam was performed (except for noted visual exam findings with Video Visits).   I connected with Joyce Maynard by telephone and verified that I am speaking with the correct person using two identifiers. Location patient: home Location provider: work  Persons participating in the telephone visit: patient, provider  I discussed the limitations, risks, security and privacy concerns of performing an evaluation and management service by telephone and the availability of in person appointments . The patient expressed understanding and agreed to proceed.   Reason for visit:  Scheduled follow up.    HPI: She reports she is doing relatively well.  Increased stress, but overall she feels she is handling things relatively well.  No suicidal ideations.  Has good support.  Trying to stay in due to COVID restrictions.  No fever.  No cough or congestion.  No sob.  No acid reflux.  No abdominal pain.  Bowels moving.  States takes colace prn.  Bowels are better.  Not checking her sugars.  Not watching her diet.  Taking her medication.  States she tripped and fell approximately one month ago.  Right breast sore for awhile, but is better now.  Ribs previously sore, but now better.  No head injury.  Overdue mammogram.  Discussed with her today.  Agrees to be scheduled.  Overdue labs.  Discussed the need for diet and exercise.  Previous dizziness - better.     ROS: See pertinent positives and negatives per HPI.  Past Medical History:  Diagnosis Date  . Breast cancer ([place]) 2005   s/p chemotherapy and XRT  .  Depression   . Diabetes ([place])   . GERD (gastroesophageal reflux disease)   . Hx of vertigo   . Hypercholesterolemia   . Hypertension   . Hypothyroidism   . Polio 1948   history  . PONV (postoperative nausea and vomiting)     Past Surgical History:  Procedure Laterality Date  . BREAST BIOPSY Left 2005  . BREAST BIOPSY Left 10/25/2015   US guided biopsy  . BREAST LUMPECTOMY Left 2005  . EXCISION OF BREAST LESION Left 11/19/2015   Procedure: EXCISION OF BREAST LESION/ MASS;  Surgeon: Leonie Green, MD;  Location: [facility] ORS;  Service: General;  Laterality: Left;  . EYE SURGERY     cataracts bilateral  . TUBAL LIGATION      Family History  Problem Relation Age of Onset  . Cancer Mother        breast  . Breast cancer Mother 3  . Stroke Father   . Breast cancer Maternal Aunt     SOCIAL HX: reviewed.    Current Outpatient Medications:  .  acetaminophen (TYLENOL) 500 MG tablet, Take 1,000 mg by mouth every 6 (six) hours as needed for mild pain., Disp: , Rfl:  .  aspirin EC 81 MG tablet, Take 81 mg by mouth daily. Has stopped prior to procedure, Disp: , Rfl:  .  BD ULTRA-FINE LANCETS lancets, Test blood glucose BID DX code 250.00, Disp: 200 each, Rfl: 1 .  bismuth subsalicylate (PEPTO BISMOL) 262 MG  chewable tablet, Chew 524 mg by mouth as needed., Disp: , Rfl:  .  Calcium Carbonate-Vitamin D (CALCIUM-VITAMIN D) 500-200 MG-UNIT tablet, Take 1 tablet by mouth daily., Disp: , Rfl:  .  glimepiride (AMARYL) 2 MG tablet, Take 1 tablet (2 mg total) by mouth 2 (two) times daily., Disp: 180 tablet, Rfl: 0 .  glucose blood (ONE TOUCH ULTRA TEST) test strip, Test blood sugar BID Dx Code:  250.00 Pt uses Contour Meter, Disp: 200 each, Rfl: 1 .  levothyroxine (SYNTHROID, LEVOTHROID) 175 MCG tablet, TAKE 1 TABLET BY MOUTH ONCE DAILY, Disp: 90 tablet, Rfl: 2 .  LORazepam (ATIVAN) 1 MG tablet, TAKE 1 TABLET BY MOUTH ONCE DAILY AS NEEDED FOR ANXIETY, Disp: 30 tablet, Rfl: 0 .  losartan  (COZAAR) 100 MG tablet, Take 1 tablet by mouth once daily, Disp: 90 tablet, Rfl: 0 .  metFORMIN (GLUCOPHAGE XR) 500 MG 24 hr tablet, Two tablets bid, Disp: 120 tablet, Rfl: 3 .  metoprolol succinate (TOPROL-XL) 25 MG 24 hr tablet, TAKE 1 TABLET BY MOUTH TWICE DAILY. NEEDS TO KEEP NEXT APPOINTMENT FOR FURTHER REFILLS., Disp: 180 tablet, Rfl: 1 .  pantoprazole (PROTONIX) 40 MG tablet, TAKE 1 TABLET BY MOUTH TWICE DAILY BEFORE A MEAL, Disp: 180 tablet, Rfl: 0 .  rosuvastatin (CRESTOR) 20 MG tablet, Take 1 tablet (20 mg total) by mouth daily., Disp: 90 tablet, Rfl: 1 .  venlafaxine XR (EFFEXOR-XR) 150 MG 24 hr capsule, TAKE 1 CAPSULE BY MOUTH ONCE DAILY, Disp: 90 capsule, Rfl: 0 .  vitamin C (ASCORBIC ACID) 500 MG tablet, Take 500 mg by mouth daily., Disp: , Rfl:   EXAM:  GENERAL: alert.  Answering questions appropriately.  Sounds to be in no acute distress.    PSYCH/NEURO: pleasant and cooperative, no obvious depression or anxiety, speech and thought processing grossly intact  ASSESSMENT AND PLAN:  Discussed the following assessment and plan:  Breast cancer screening - Plan: MM DIAG BREAST TOMO BILATERAL, US BREAST LTD UNI LEFT INC AXILLA, US BREAST LTD UNI RIGHT INC AXILLA  History of breast cancer - Plan: MM DIAG BREAST TOMO BILATERAL, US BREAST LTD UNI LEFT INC AXILLA, US BREAST LTD UNI RIGHT INC AXILLA  Type 2 diabetes mellitus with diabetic neuropathy, without long-term current use of insulin ([place]) - Plan: Hemoglobin A1c, Microalbumin / creatinine urine ratio  Essential hypertension, benign - Plan: CBC with Differential/Platelet, Basic metabolic panel  Gastroesophageal reflux disease, esophagitis presence not specified  Hypercholesterolemia - Plan: Hepatic function panel, Lipid panel  Hypothyroidism, unspecified type - Plan: TSH  Mild depression ([place])  Diabetes Discussed importance of low carb diet and exercise.  Stay hydrated.  Overdue labs.  Taking her medication.  Follow met b  and a1c.   Essential hypertension, benign Blood pressure has been under good control.  Continue current medication regimen.  Follow pressures.  Follow metabolic panel.    GERD (gastroesophageal reflux disease) Controlled.  Follow.    History of breast cancer Overdue mammogram.  Schedule mammogram.   Hypercholesterolemia Low cholesterol diet and exercise.  Follow lipid panel and liver function tests.    Hypothyroidism On thyroid replacement.  Follow tsh.    Mild depression Discussed with her today.  Overall she feels she is doing relatively well.  On effexor.  Does not feel she needs anything more at this time.  Follow.      I discussed the assessment and treatment plan with the patient. The patient was provided an opportunity to ask questions and all were  answered. The patient agreed with the plan and demonstrated an understanding of the instructions.   The patient was advised to call back or seek an in-person evaluation if the symptoms worsen or if the condition fails to improve as anticipated.  I provided 22 minutes of non-face-to-face time during this encounter.   Einar Pheasant, MD

## 2018-06-05 ENCOUNTER — Encounter: Payer: Self-pay | Admitting: Internal Medicine

## 2018-06-05 NOTE — Assessment & Plan Note (Signed)
Blood pressure has been under good control.  Continue current medication regimen.  Follow pressures.  Follow metabolic panel.  

## 2018-06-05 NOTE — Assessment & Plan Note (Signed)
Discussed importance of low carb diet and exercise.  Stay hydrated.  Overdue labs.  Taking her medication.  Follow met b and a1c.

## 2018-06-05 NOTE — Assessment & Plan Note (Signed)
On thyroid replacement.  Follow tsh.  

## 2018-06-05 NOTE — Assessment & Plan Note (Signed)
Controlled.  Follow.   

## 2018-06-05 NOTE — Assessment & Plan Note (Signed)
Overdue mammogram.  Schedule mammogram.

## 2018-06-05 NOTE — Assessment & Plan Note (Signed)
Low cholesterol diet and exercise.  Follow lipid panel and liver function tests.  

## 2018-06-05 NOTE — Assessment & Plan Note (Signed)
Discussed with her today.  Overall she feels she is doing relatively well.  On effexor.  Does not feel she needs anything more at this time.  Follow.

## 2018-06-08 ENCOUNTER — Other Ambulatory Visit: Payer: Self-pay | Admitting: Internal Medicine

## 2018-06-08 DIAGNOSIS — Z1239 Encounter for other screening for malignant neoplasm of breast: Secondary | ICD-10-CM

## 2018-06-08 NOTE — Progress Notes (Signed)
Order placed for screening mammogram

## 2018-06-23 ENCOUNTER — Encounter: Payer: Self-pay | Admitting: Internal Medicine

## 2018-08-09 ENCOUNTER — Encounter: Payer: Self-pay | Admitting: Internal Medicine

## 2018-08-09 ENCOUNTER — Ambulatory Visit (INDEPENDENT_AMBULATORY_CARE_PROVIDER_SITE_OTHER): Payer: Medicare HMO | Admitting: Internal Medicine

## 2018-08-09 DIAGNOSIS — E1140 Type 2 diabetes mellitus with diabetic neuropathy, unspecified: Secondary | ICD-10-CM | POA: Diagnosis not present

## 2018-08-09 DIAGNOSIS — Z853 Personal history of malignant neoplasm of breast: Secondary | ICD-10-CM

## 2018-08-09 DIAGNOSIS — I10 Essential (primary) hypertension: Secondary | ICD-10-CM

## 2018-08-09 DIAGNOSIS — F320 Major depressive disorder, single episode, mild: Secondary | ICD-10-CM

## 2018-08-09 DIAGNOSIS — K219 Gastro-esophageal reflux disease without esophagitis: Secondary | ICD-10-CM | POA: Diagnosis not present

## 2018-08-09 DIAGNOSIS — E7800 Pure hypercholesterolemia, unspecified: Secondary | ICD-10-CM

## 2018-08-09 DIAGNOSIS — E039 Hypothyroidism, unspecified: Secondary | ICD-10-CM

## 2018-08-09 DIAGNOSIS — F32A Depression, unspecified: Secondary | ICD-10-CM

## 2018-08-09 DIAGNOSIS — Z1239 Encounter for other screening for malignant neoplasm of breast: Secondary | ICD-10-CM

## 2018-08-09 NOTE — Progress Notes (Addendum)
[name], female   DOB: September 06, 1940, 78 y.o.   MRN: [id number]   Virtual Visit via telephone Note  This visit type was conducted due to national recommendations for restrictions regarding the COVID-19 pandemic (e.g. social distancing).  This format is felt to be most appropriate for this patient at this time.  All issues noted in this document were discussed and addressed.  No physical exam was performed (except for noted visual exam findings with Video Visits).   I connected with Kendell Bane by telephone and verified that I am speaking with the correct person using two identifiers. Location patient: home Location provider: work Persons participating in the telephone visit: patient, provider and [name]).   I discussed the limitations, risks, security and privacy concerns of performing an evaluation and management service by video and the availability of in person appointments.  The patient expressed understanding and agreed to proceed.   Reason for visit: scheduled follow up.   HPI: She feels good.  States she feels better now than she has in a long time.  Stays active.  No chest pain.  No sob.  No acid reflux.  No abdominal pain.  Bowels moving.  Has a history of constipation.  Better.  No dizziness lately.  No headache.  States sugars averaging 150s.  Discussed importance of keeping regular follow up appts and the need for labs.     ROS: See pertinent positives and negatives per HPI.  Past Medical History:  Diagnosis Date  . Breast cancer ([place]) 2005   s/p chemotherapy and XRT  . Depression   . Diabetes ([place])   . GERD (gastroesophageal reflux disease)   . Hx of vertigo   . Hypercholesterolemia   . Hypertension   . Hypothyroidism   . Polio 1948   history  . PONV (postoperative nausea and vomiting)     Past Surgical History:  Procedure Laterality Date  . BREAST BIOPSY Left 2005  . BREAST BIOPSY Left 10/25/2015   US guided biopsy  . BREAST LUMPECTOMY  Left 2005  . EXCISION OF BREAST LESION Left 11/19/2015   Procedure: EXCISION OF BREAST LESION/ MASS;  Surgeon: Leonie Green, MD;  Location: [facility] ORS;  Service: General;  Laterality: Left;  . EYE SURGERY     cataracts bilateral  . TUBAL LIGATION      Family History  Problem Relation Age of Onset  . Cancer Mother        breast  . Breast cancer Mother 23  . Stroke Father   . Breast cancer Maternal Aunt     SOCIAL HX: reviewed.    Current Outpatient Medications:  .  acetaminophen (TYLENOL) 500 MG tablet, Take 1,000 mg by mouth every 6 (six) hours as needed for mild pain., Disp: , Rfl:  .  aspirin EC 81 MG tablet, Take 81 mg by mouth daily. Has stopped prior to procedure, Disp: , Rfl:  .  BD ULTRA-FINE LANCETS lancets, Test blood glucose BID DX code 250.00, Disp: 200 each, Rfl: 1 .  bismuth subsalicylate (PEPTO BISMOL) 262 MG chewable tablet, Chew 524 mg by mouth as needed., Disp: , Rfl:  .  Calcium Carbonate-Vitamin D (CALCIUM-VITAMIN D) 500-200 MG-UNIT tablet, Take 1 tablet by mouth daily., Disp: , Rfl:  .  glimepiride (AMARYL) 2 MG tablet, Take 1 tablet (2 mg total) by mouth 2 (two) times daily., Disp: 180 tablet, Rfl: 0 .  glucose blood (ONE TOUCH ULTRA TEST) test strip, Test blood sugar  BID Dx Code:  250.00 Pt uses Contour Meter, Disp: 200 each, Rfl: 1 .  levothyroxine (SYNTHROID, LEVOTHROID) 175 MCG tablet, TAKE 1 TABLET BY MOUTH ONCE DAILY, Disp: 90 tablet, Rfl: 2 .  LORazepam (ATIVAN) 1 MG tablet, TAKE 1 TABLET BY MOUTH ONCE DAILY AS NEEDED FOR ANXIETY, Disp: 30 tablet, Rfl: 0 .  losartan (COZAAR) 100 MG tablet, Take 1 tablet by mouth once daily, Disp: 90 tablet, Rfl: 0 .  metFORMIN (GLUCOPHAGE XR) 500 MG 24 hr tablet, Two tablets bid, Disp: 120 tablet, Rfl: 3 .  metoprolol succinate (TOPROL-XL) 25 MG 24 hr tablet, TAKE 1 TABLET BY MOUTH TWICE DAILY. NEEDS TO KEEP NEXT APPOINTMENT FOR FURTHER REFILLS., Disp: 180 tablet, Rfl: 1 .  pantoprazole (PROTONIX) 40 MG tablet, TAKE 1  TABLET BY MOUTH TWICE DAILY BEFORE A MEAL, Disp: 180 tablet, Rfl: 0 .  rosuvastatin (CRESTOR) 20 MG tablet, Take 1 tablet (20 mg total) by mouth daily., Disp: 90 tablet, Rfl: 1 .  venlafaxine XR (EFFEXOR-XR) 150 MG 24 hr capsule, TAKE 1 CAPSULE BY MOUTH ONCE DAILY, Disp: 90 capsule, Rfl: 0 .  vitamin C (ASCORBIC ACID) 500 MG tablet, Take 500 mg by mouth daily., Disp: , Rfl:   EXAM:  VITALS per patient if applicable: 307/46  GENERAL: alert.  Sounds to be in no acute distress.  Answering questions appropriately.    PSYCH/NEURO: pleasant and cooperative, no obvious depression or anxiety, speech and thought processing grossly intact  ASSESSMENT AND PLAN:  Discussed the following assessment and plan:  Diabetes Low carb diet and exercise.  Discussed the need for regular f/u and labs.  Follow met b and a1c.   Essential hypertension, benign Blood pressure has been under good control.  Recent blood pressure as outlined.  Continue current medication regimen.  Follow pressures.  Follow metabolic panel.   GERD (gastroesophageal reflux disease) Controlled on current medication regimen.  Follow.    History of breast cancer Overdue mammogram.  Again discussed with her today.  Schedule mammogram.    Hypercholesterolemia On crestor.  Low cholesterol diet and exercise.  Follow lipid panel and liver function tests.    Hypothyroidism On thyroid replacement.  Follow tsh.   Mild depression Feel better.  Continue current medication regimen.  Follow.      I discussed the assessment and treatment plan with the patient. The patient was provided an opportunity to ask questions and all were answered. The patient agreed with the plan and demonstrated an understanding of the instructions.   The patient was advised to call back or seek an in-person evaluation if the symptoms worsen or if the condition fails to improve as anticipated.  I provided 15 minutes of non-face-to-face time during this  encounter.   Einar Pheasant, MD

## 2018-08-14 ENCOUNTER — Encounter: Payer: Self-pay | Admitting: Internal Medicine

## 2018-08-14 NOTE — Assessment & Plan Note (Signed)
On crestor.  Low cholesterol diet and exercise.  Follow lipid panel and liver function tests.   

## 2018-08-14 NOTE — Assessment & Plan Note (Signed)
Feel better.  Continue current medication regimen.  Follow.

## 2018-08-14 NOTE — Assessment & Plan Note (Signed)
On thyroid replacement.  Follow tsh.  

## 2018-08-14 NOTE — Addendum Note (Signed)
Addended by: Alisa Graff on: 08/14/2018 06:44 AM   Modules accepted: Orders

## 2018-08-14 NOTE — Assessment & Plan Note (Signed)
Low carb diet and exercise.  Discussed the need for regular f/u and labs.  Follow met b and a1c.

## 2018-08-14 NOTE — Assessment & Plan Note (Signed)
Blood pressure has been under good control.  Recent blood pressure as outlined.  Continue current medication regimen.  Follow pressures.  Follow metabolic panel.

## 2018-08-14 NOTE — Assessment & Plan Note (Signed)
Overdue mammogram.  Again discussed with her today.  Schedule mammogram.

## 2018-08-14 NOTE — Assessment & Plan Note (Signed)
Controlled on current medication regimen.  Follow.   

## 2018-08-25 ENCOUNTER — Other Ambulatory Visit: Payer: Self-pay | Admitting: Internal Medicine

## 2018-08-26 NOTE — Telephone Encounter (Signed)
Refilled: 06/02/2018 Last OV: 07/13/2017 Next OV: 12/12/2018

## 2018-09-05 ENCOUNTER — Other Ambulatory Visit: Payer: Self-pay | Admitting: Internal Medicine

## 2018-12-08 ENCOUNTER — Other Ambulatory Visit (INDEPENDENT_AMBULATORY_CARE_PROVIDER_SITE_OTHER): Payer: Medicare HMO

## 2018-12-08 ENCOUNTER — Other Ambulatory Visit: Payer: Self-pay

## 2018-12-08 DIAGNOSIS — E1140 Type 2 diabetes mellitus with diabetic neuropathy, unspecified: Secondary | ICD-10-CM

## 2018-12-08 DIAGNOSIS — E039 Hypothyroidism, unspecified: Secondary | ICD-10-CM

## 2018-12-08 DIAGNOSIS — E7800 Pure hypercholesterolemia, unspecified: Secondary | ICD-10-CM

## 2018-12-08 DIAGNOSIS — I10 Essential (primary) hypertension: Secondary | ICD-10-CM | POA: Diagnosis not present

## 2018-12-08 LAB — BASIC METABOLIC PANEL
BUN: 20 mg/dL (ref 6–23)
CO2: 26 mEq/L (ref 19–32)
Calcium: 9.6 mg/dL (ref 8.4–10.5)
Chloride: 103 mEq/L (ref 96–112)
Creatinine, Ser: 1.27 mg/dL — ABNORMAL HIGH (ref 0.40–1.20)
GFR: 40.65 mL/min — ABNORMAL LOW (ref >60.00)
Glucose, Bld: 217 mg/dL — ABNORMAL HIGH (ref 70–99)
Potassium: 4.5 mEq/L (ref 3.5–5.1)
Sodium: 140 mEq/L (ref 135–145)

## 2018-12-08 LAB — CBC WITH DIFFERENTIAL/PLATELET
Basophils Absolute: 0 10*3/uL (ref 0.0–0.1)
Basophils Relative: 0.8 % (ref 0.0–3.0)
Eosinophils Absolute: 0.1 10*3/uL (ref 0.0–0.7)
Eosinophils Relative: 2.3 % (ref 0.0–5.0)
HCT: 43.7 % (ref 36.0–46.0)
Hemoglobin: 14.7 g/dL (ref 12.0–15.0)
Lymphocytes Relative: 30.1 % (ref 12.0–46.0)
Lymphs Abs: 1.8 10*3/uL (ref 0.7–4.0)
MCHC: 33.5 g/dL (ref 30.0–36.0)
MCV: 101.5 fl — ABNORMAL HIGH (ref 78.0–100.0)
Monocytes Absolute: 0.5 10*3/uL (ref 0.1–1.0)
Monocytes Relative: 8.8 % (ref 3.0–12.0)
Neutro Abs: 3.5 10*3/uL (ref 1.4–7.7)
Neutrophils Relative %: 58 % (ref 43.0–77.0)
Platelets: 206 10*3/uL (ref 150.0–400.0)
RBC: 4.31 Mil/uL (ref 3.87–5.11)
RDW: 14 % (ref 11.5–15.5)
WBC: 6 10*3/uL (ref 4.0–10.5)

## 2018-12-08 LAB — HEPATIC FUNCTION PANEL
ALT: 41 U/L — ABNORMAL HIGH (ref 0–35)
AST: 40 U/L — ABNORMAL HIGH (ref 0–37)
Albumin: 4.2 g/dL (ref 3.5–5.2)
Alkaline Phosphatase: 65 U/L (ref 39–117)
Bilirubin, Direct: 0.1 mg/dL (ref 0.0–0.3)
Total Bilirubin: 0.6 mg/dL (ref 0.2–1.2)
Total Protein: 7.4 g/dL (ref 6.0–8.3)

## 2018-12-08 LAB — LIPID PANEL
Cholesterol: 248 mg/dL — ABNORMAL HIGH (ref 0–200)
HDL: 46.4 mg/dL (ref >39.00)
LDL Cholesterol: 171 mg/dL — ABNORMAL HIGH (ref 0–99)
NonHDL: 201.34
Total CHOL/HDL Ratio: 5
Triglycerides: 150 mg/dL — ABNORMAL HIGH (ref 0.0–149.0)
VLDL: 30 mg/dL (ref 0.0–40.0)

## 2018-12-08 LAB — MICROALBUMIN / CREATININE URINE RATIO
Creatinine,U: 206 mg/dL
Microalb Creat Ratio: 0.5 mg/g (ref 0.0–30.0)
Microalb, Ur: 1 mg/dL (ref 0.0–1.9)

## 2018-12-08 LAB — HEMOGLOBIN A1C: Hgb A1c MFr Bld: 7.5 % — ABNORMAL HIGH (ref 4.6–6.5)

## 2018-12-08 LAB — TSH: TSH: 17.78 u[IU]/mL — ABNORMAL HIGH (ref 0.35–4.50)

## 2018-12-12 ENCOUNTER — Encounter: Payer: Self-pay | Admitting: Internal Medicine

## 2018-12-12 ENCOUNTER — Other Ambulatory Visit: Payer: Self-pay

## 2018-12-12 ENCOUNTER — Ambulatory Visit (INDEPENDENT_AMBULATORY_CARE_PROVIDER_SITE_OTHER): Payer: Medicare HMO | Admitting: Internal Medicine

## 2018-12-12 VITALS — Ht 66.0 in | Wt 205.0 lb

## 2018-12-12 DIAGNOSIS — I10 Essential (primary) hypertension: Secondary | ICD-10-CM | POA: Diagnosis not present

## 2018-12-12 DIAGNOSIS — E1140 Type 2 diabetes mellitus with diabetic neuropathy, unspecified: Secondary | ICD-10-CM | POA: Diagnosis not present

## 2018-12-12 DIAGNOSIS — K219 Gastro-esophageal reflux disease without esophagitis: Secondary | ICD-10-CM

## 2018-12-12 DIAGNOSIS — F320 Major depressive disorder, single episode, mild: Secondary | ICD-10-CM

## 2018-12-12 DIAGNOSIS — N1832 Chronic kidney disease, stage 3b: Secondary | ICD-10-CM

## 2018-12-12 DIAGNOSIS — N183 Chronic kidney disease, stage 3 unspecified: Secondary | ICD-10-CM | POA: Insufficient documentation

## 2018-12-12 DIAGNOSIS — E7800 Pure hypercholesterolemia, unspecified: Secondary | ICD-10-CM

## 2018-12-12 DIAGNOSIS — F32A Depression, unspecified: Secondary | ICD-10-CM

## 2018-12-12 DIAGNOSIS — D7589 Other specified diseases of blood and blood-forming organs: Secondary | ICD-10-CM

## 2018-12-12 DIAGNOSIS — E039 Hypothyroidism, unspecified: Secondary | ICD-10-CM

## 2018-12-12 DIAGNOSIS — Z853 Personal history of malignant neoplasm of breast: Secondary | ICD-10-CM

## 2018-12-12 MED ORDER — ROSUVASTATIN CALCIUM 20 MG PO TABS: 20.0000 mg | ORAL_TABLET | Freq: Every day | ORAL | 2 refills | Status: DC

## 2018-12-12 NOTE — Assessment & Plan Note (Signed)
States she is taking her synthroid.  Taking with her other medications.  Discussed taking correctly.  Follow tsh.

## 2018-12-12 NOTE — Assessment & Plan Note (Signed)
Low carb diet and exercise.  Discussed the need to check sugars.  a1c improved.  Still elevated above goal.  Follow met b and a1c.

## 2018-12-12 NOTE — Assessment & Plan Note (Signed)
Apparently off losartan.  Son plans to check blood pressure and send in readings.  Discussed may need to restart losartan.  Follow met b.

## 2018-12-12 NOTE — Progress Notes (Signed)
[name], female   DOB: [birth year], 78 y.o.   MRN: [id number]   Virtual Visit via telephone Note  This visit type was conducted due to national recommendations for restrictions regarding the COVID-19 pandemic (e.g. social distancing).  This format is felt to be most appropriate for this patient at this time.  All issues noted in this document were discussed and addressed.  No physical exam was performed (except for noted visual exam findings with Video Visits).   I connected with Lib Belger by telephone and verified that I am speaking with the correct person using two identifiers. Location patient: home Location provider: work  Persons participating in the virtual visit: patient, provider and [name]  I discussed the limitations, risks, security and privacy concerns of performing an evaluation and management service by telephone and the availability of in person appointments. The patient expressed understanding and agreed to proceed.   Reason for visit: scheduled follow up.   HPI: She reports she is doing relatively well.  Handling stress.  Staying in due to covid restrictions.  No chest congestion, cough or sob.  No chest pain or tightness.  No headache or dizziness.  No acid reflux.  No abdominal pain.  Bowels moving.  Discussed recent labs.  Her a1c has improved from previous check.  Still elevated.  a1c 7.5.  She does not check sugars.  Stays up at night.  Sleeps during day.  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 Discussed low carb diet and exercise.  States she is taking her medication, but has had issues with compliance in the past.  TSH elevated.  States she is taking her medication, but not taking correctly.  Cholesterol elevated.  Not taking cholesterol medication.  Reports no problems taking the cholesterol medication. Scheduled for mammogram 02/2019.  Last colonoscopy 2014.  States turned in cologuard.  Do not have results.       ROS: See  pertinent positives and negatives per HPI.  Past Medical History:  Diagnosis Date  . Breast cancer ([place]) 2005   s/p chemotherapy and XRT  . Depression   . Diabetes ([place])   . GERD (gastroesophageal reflux disease)   . Hx of vertigo   . Hypercholesterolemia   . Hypertension   . Hypothyroidism   . Polio 1948   history  . PONV (postoperative nausea and vomiting)     Past Surgical History:  Procedure Laterality Date  . BREAST BIOPSY Left 2005  . BREAST BIOPSY Left 10/25/2015   US guided biopsy  . BREAST LUMPECTOMY Left 2005  . EXCISION OF BREAST LESION Left 11/19/2015   Procedure: EXCISION OF BREAST LESION/ MASS;  Surgeon: Leonie Green, MD;  Location: [facility] ORS;  Service: General;  Laterality: Left;  . EYE SURGERY     cataracts bilateral  . TUBAL LIGATION      Family History  Problem Relation Age of Onset  . Cancer Mother        breast  . Breast cancer Mother 60  . Stroke Father   . Breast cancer Maternal Aunt     SOCIAL HX: reviewed.    Current Outpatient Medications:  .  acetaminophen (TYLENOL) 500 MG tablet, Take 1,000 mg by mouth every 6 (six) hours as needed for mild pain., Disp: , Rfl:  .  aspirin EC 81 MG tablet, Take 81 mg by mouth daily. Has stopped prior to procedure, Disp: , Rfl:  .  BD ULTRA-FINE LANCETS  lancets, Test blood glucose BID DX code 250.00, Disp: 200 each, Rfl: 1 .  bismuth subsalicylate (PEPTO BISMOL) 262 MG chewable tablet, Chew 524 mg by mouth as needed., Disp: , Rfl:  .  glimepiride (AMARYL) 2 MG tablet, Take 1 tablet (2 mg total) by mouth 2 (two) times daily., Disp: 180 tablet, Rfl: 0 .  glucose blood (ONE TOUCH ULTRA TEST) test strip, Test blood sugar BID Dx Code:  250.00 Pt uses Contour Meter, Disp: 200 each, Rfl: 1 .  levothyroxine (SYNTHROID) 175 MCG tablet, Take 1 tablet by mouth once daily, Disp: 90 tablet, Rfl: 0 .  LORazepam (ATIVAN) 1 MG tablet, TAKE 1 TABLET BY MOUTH ONCE DAILY AS NEEDED FOR ANXIETY, Disp: 30 tablet, Rfl: 0 .   metFORMIN (GLUCOPHAGE XR) 500 MG 24 hr tablet, Two tablets bid, Disp: 120 tablet, Rfl: 3 .  metoprolol succinate (TOPROL-XL) 25 MG 24 hr tablet, TAKE 1 TABLET BY MOUTH TWICE DAILY. NEEDS TO KEEP NEXT APPOINTMENT FOR FURTHER REFILLS., Disp: 180 tablet, Rfl: 1 .  pantoprazole (PROTONIX) 40 MG tablet, TAKE 1 TABLET BY MOUTH TWICE DAILY BEFORE A MEAL, Disp: 180 tablet, Rfl: 1 .  rosuvastatin (CRESTOR) 20 MG tablet, Take 1 tablet (20 mg total) by mouth daily., Disp: 30 tablet, Rfl: 2 .  venlafaxine XR (EFFEXOR-XR) 150 MG 24 hr capsule, Take 1 capsule by mouth once daily, Disp: 90 capsule, Rfl: 0  EXAM:  GENERAL: alert, oriented, appears well and in no acute distress  HEENT: atraumatic, conjunttiva clear, no obvious abnormalities on inspection of external nose and ears  NECK: normal movements of the head and neck  LUNGS: on inspection no signs of respiratory distress, breathing rate appears normal, no obvious gross SOB, gasping or wheezing  CV: no obvious cyanosis  PSYCH/NEURO: pleasant and cooperative, no obvious depression or anxiety, speech and thought processing grossly intact  ASSESSMENT AND PLAN:  Discussed the following assessment and plan:  Diabetes Low carb diet and exercise.  Discussed the need to check sugars.  a1c improved.  Still elevated above goal.  Follow met b and a1c.   Essential hypertension, benign Apparently off losartan.  Son plans to check blood pressure and send in readings.  Discussed may need to restart losartan.  Follow met b.    GERD (gastroesophageal reflux disease) Upper symptoms controlled.  Follow.    History of breast cancer Overdue mammogram.  Scheduled in 02/2019.    Hypercholesterolemia Elevated cholesterol.  Off crestor.  Discussed restarting medication.  Low cholesterol diet and exercise.  Follow lipid panel and liver function tests.   Hypothyroidism States she is taking her synthroid.  Taking with her other medications.  Discussed taking  correctly.  Follow tsh.    Mild depression Doing well. Feels better.  Follow.    Chronic kidney disease (CKD), stage III (moderate) Avoid antiinflammatories.  Renal function decreased when compared to the previous check.  Stay hydrated.  Follow metabolic panel.     I discussed the assessment and treatment plan with the patient. The patient was provided an opportunity to ask questions and all were answered. The patient agreed with the plan and demonstrated an understanding of the instructions.   The patient was advised to call back or seek an in-person evaluation if the symptoms worsen or if the condition fails to improve as anticipated.  I provided 23 minutes of non-face-to-face time during this encounter.   Einar Pheasant, MD

## 2018-12-12 NOTE — Assessment & Plan Note (Signed)
Overdue mammogram.  Scheduled in 02/2019.

## 2018-12-12 NOTE — Assessment & Plan Note (Signed)
Upper symptoms controlled.  Follow.  

## 2018-12-12 NOTE — Assessment & Plan Note (Signed)
Doing well.  Feels better.  Follow.  

## 2018-12-12 NOTE — Assessment & Plan Note (Signed)
Avoid antiinflammatories.  Renal function decreased when compared to the previous check.  Stay hydrated.  Follow metabolic panel.

## 2018-12-12 NOTE — Assessment & Plan Note (Signed)
Elevated cholesterol.  Off crestor.  Discussed restarting medication.  Low cholesterol diet and exercise.  Follow lipid panel and liver function tests.

## 2019-01-05 ENCOUNTER — Other Ambulatory Visit: Payer: Self-pay

## 2019-01-09 ENCOUNTER — Telehealth: Payer: Self-pay | Admitting: Internal Medicine

## 2019-01-09 NOTE — Telephone Encounter (Signed)
Pt's son wanted Dr. Nicki Reaper to know his mother's BP readings; 132/84 , average Dec. 10th - Jan 2nd.

## 2019-01-09 NOTE — Telephone Encounter (Signed)
Thank him for the update.  Her blood pressures are ok.  Continue current medication regimen.

## 2019-01-09 NOTE — Telephone Encounter (Signed)
Pt's son is aware

## 2019-01-10 ENCOUNTER — Other Ambulatory Visit: Payer: Medicare HMO

## 2019-01-17 ENCOUNTER — Other Ambulatory Visit (INDEPENDENT_AMBULATORY_CARE_PROVIDER_SITE_OTHER): Payer: Medicare HMO

## 2019-01-17 ENCOUNTER — Other Ambulatory Visit: Payer: Self-pay

## 2019-01-17 ENCOUNTER — Other Ambulatory Visit: Payer: Self-pay | Admitting: Internal Medicine

## 2019-01-17 DIAGNOSIS — E039 Hypothyroidism, unspecified: Secondary | ICD-10-CM

## 2019-01-17 DIAGNOSIS — D7589 Other specified diseases of blood and blood-forming organs: Secondary | ICD-10-CM | POA: Diagnosis not present

## 2019-01-17 DIAGNOSIS — N1832 Chronic kidney disease, stage 3b: Secondary | ICD-10-CM

## 2019-01-17 DIAGNOSIS — E7800 Pure hypercholesterolemia, unspecified: Secondary | ICD-10-CM

## 2019-01-17 LAB — VITAMIN B12: Vitamin B-12: 121 pg/mL — ABNORMAL LOW (ref 211–911)

## 2019-01-17 LAB — BASIC METABOLIC PANEL
BUN: 13 mg/dL (ref 6–23)
CO2: 26 mEq/L (ref 19–32)
Calcium: 9.3 mg/dL (ref 8.4–10.5)
Chloride: 103 mEq/L (ref 96–112)
Creatinine, Ser: 0.99 mg/dL (ref 0.40–1.20)
GFR: 54.18 mL/min — ABNORMAL LOW (ref >60.00)
Glucose, Bld: 208 mg/dL — ABNORMAL HIGH (ref 70–99)
Potassium: 3.5 mEq/L (ref 3.5–5.1)
Sodium: 139 mEq/L (ref 135–145)

## 2019-01-17 LAB — HEPATIC FUNCTION PANEL
ALT: 37 U/L — ABNORMAL HIGH (ref 0–35)
AST: 42 U/L — ABNORMAL HIGH (ref 0–37)
Albumin: 4 g/dL (ref 3.5–5.2)
Alkaline Phosphatase: 63 U/L (ref 39–117)
Bilirubin, Direct: 0.2 mg/dL (ref 0.0–0.3)
Total Bilirubin: 0.8 mg/dL (ref 0.2–1.2)
Total Protein: 7.2 g/dL (ref 6.0–8.3)

## 2019-01-17 LAB — TSH: TSH: 8.12 u[IU]/mL — ABNORMAL HIGH (ref 0.35–4.50)

## 2019-01-17 NOTE — Telephone Encounter (Signed)
rx sent in for effexor, lorazepam, crestor and thyroid medication.

## 2019-01-17 NOTE — Telephone Encounter (Signed)
Pt had labs drawn this morning & has upcoming appt on 02/15/19. Okay to refill meds?

## 2019-01-19 ENCOUNTER — Other Ambulatory Visit: Payer: Self-pay | Admitting: Internal Medicine

## 2019-01-19 DIAGNOSIS — E039 Hypothyroidism, unspecified: Secondary | ICD-10-CM

## 2019-01-19 DIAGNOSIS — E1140 Type 2 diabetes mellitus with diabetic neuropathy, unspecified: Secondary | ICD-10-CM

## 2019-01-19 DIAGNOSIS — E7800 Pure hypercholesterolemia, unspecified: Secondary | ICD-10-CM

## 2019-01-19 DIAGNOSIS — I10 Essential (primary) hypertension: Secondary | ICD-10-CM

## 2019-01-19 NOTE — Progress Notes (Signed)
Orders placed for f/u labs.  

## 2019-01-24 ENCOUNTER — Telehealth: Payer: Self-pay | Admitting: Internal Medicine

## 2019-01-24 NOTE — Telephone Encounter (Signed)
Pt's son called and stated that the pharmacy did not fill b12 injection. Pt would like that called in if possible.

## 2019-01-25 NOTE — Telephone Encounter (Signed)
Are you ok with daughter in law giving b12 since she is a Marine scientist. They do not want to come here for her to get injection

## 2019-01-25 NOTE — Telephone Encounter (Signed)
I am ok.  Can they come in for one nurse visit and have her demonstrate - that way we have documented in our chart.

## 2019-01-26 NOTE — Telephone Encounter (Signed)
Pt scheduled for 1st b12 injection. Wanted to wait until they could come on a Tuesday morning.

## 2019-01-30 ENCOUNTER — Telehealth: Payer: Self-pay | Admitting: Internal Medicine

## 2019-01-30 DIAGNOSIS — E538 Deficiency of other specified B group vitamins: Secondary | ICD-10-CM

## 2019-01-30 NOTE — Telephone Encounter (Signed)
Pts daughter-n-law is Therapist, sports.  I am ok if she gives B12 injections at home.  Do we need to have documented in any other way - to give B12 injections?  Thanks

## 2019-01-30 NOTE — Telephone Encounter (Signed)
Pt's son called and stated that pt refuses to come in for b-12 injection/teaching. He states that his wife Miosha Suitt is capable of doing it and he says pt states "If it is important enough, Dr Nicki Reaper will allow her to give me my injection at home." Please advise.

## 2019-01-31 ENCOUNTER — Telehealth: Payer: Self-pay

## 2019-01-31 MED ORDER — CYANOCOBALAMIN 1000 MCG/ML IJ SOLN: 1000.0000 ug | INTRAMUSCULAR | 0 refills | Status: DC

## 2019-01-31 MED ORDER — "LUER LOCK SAFETY SYRINGES 25G X 1"" 3 ML MISC": 0 refills | Status: DC

## 2019-01-31 NOTE — Telephone Encounter (Signed)
I spoke with [place] to give verbal okay to change patient's Euthyrox 175 mcg to levothyroxine 175 mcg.

## 2019-01-31 NOTE — Telephone Encounter (Signed)
rx sent in for b12 and syringes.

## 2019-01-31 NOTE — Telephone Encounter (Signed)
No other documentation required. I have pended the script and syringes if these need to be sent?

## 2019-02-15 ENCOUNTER — Ambulatory Visit: Payer: Medicare HMO | Admitting: Internal Medicine

## 2019-02-16 ENCOUNTER — Other Ambulatory Visit: Payer: Self-pay

## 2019-02-16 ENCOUNTER — Telehealth: Payer: Self-pay | Admitting: Internal Medicine

## 2019-02-16 DIAGNOSIS — E538 Deficiency of other specified B group vitamins: Secondary | ICD-10-CM

## 2019-02-16 MED ORDER — CYANOCOBALAMIN 1000 MCG/ML IJ SOLN: 1000.0000 ug | INTRAMUSCULAR | 0 refills | Status: DC

## 2019-02-16 NOTE — Telephone Encounter (Signed)
Pt son called and said that they need a refill on cyanocobalamin (,VITAMIN B-12,) 1000 MCG/ML injection and that he wanted to cancel the b12 teaching

## 2019-02-16 NOTE — Telephone Encounter (Signed)
Rx sent in. Pt has already been getting them at home and has completed her weeklys.

## 2019-02-21 ENCOUNTER — Ambulatory Visit: Payer: Medicare HMO

## 2019-03-07 ENCOUNTER — Ambulatory Visit: Payer: Medicare HMO

## 2019-03-21 ENCOUNTER — Other Ambulatory Visit: Payer: Medicare HMO

## 2019-03-24 ENCOUNTER — Telehealth: Payer: Medicare HMO | Admitting: Internal Medicine

## 2019-04-13 ENCOUNTER — Other Ambulatory Visit: Payer: Self-pay | Admitting: Internal Medicine

## 2019-06-27 ENCOUNTER — Ambulatory Visit: Payer: Medicare HMO

## 2019-06-27 ENCOUNTER — Telehealth: Payer: Self-pay

## 2019-06-27 NOTE — Telephone Encounter (Signed)
Failed attempt to reach patient for scheduled awv. No answer, unable to leave a voicemail. Please reschedule as appropriate.

## 2019-08-17 ENCOUNTER — Ambulatory Visit (INDEPENDENT_AMBULATORY_CARE_PROVIDER_SITE_OTHER): Payer: Medicare HMO

## 2019-08-17 VITALS — Ht 66.0 in | Wt 205.0 lb

## 2019-08-17 DIAGNOSIS — Z0000 Encounter for general adult medical examination without abnormal findings: Secondary | ICD-10-CM

## 2019-08-17 NOTE — Progress Notes (Signed)
Subjective:   Macy E Overacker is a 79 y.o. female who presents for an Initial Medicare Annual Wellness Visit.  Review of Systems    No ROS.  Medicare Wellness Virtual Visit.   Cardiac Risk Factors include: advanced age (>59men, >95 women);diabetes mellitus;hypertension     Objective:    Today's Vitals   08/17/19 1427  Weight: 205 lb (93 kg)  Height: 5\' 6"  (1.676 m)   Body mass index is 33.09 kg/m.  Advanced Directives 08/17/2019 11/19/2015 11/15/2015 05/31/2015  Does Patient Have a Medical Advance Directive? Yes No No No  Type of Advance Directive [place]  Does patient want to make changes to medical advance directive? No - Patient declined - - -  Copy of [place] in Chart? Yes - validated most recent copy scanned in chart (See row information) - - -  Would patient like information on creating a medical advance directive? - No - patient declined information No - patient declined information No - patient declined information    Current Medications (verified) Outpatient Encounter Medications as of 08/17/2019  Medication Sig  . acetaminophen (TYLENOL) 500 MG tablet Take 1,000 mg by mouth every 6 (six) hours as needed for mild pain.  Marland Kitchen aspirin EC 81 MG tablet Take 81 mg by mouth daily. Has stopped prior to procedure  . BD ULTRA-FINE LANCETS lancets Test blood glucose BID DX code 250.00  . bismuth subsalicylate (PEPTO BISMOL) 262 MG chewable tablet Chew 524 mg by mouth as needed.  . cyanocobalamin (,VITAMIN B-12,) 1000 MCG/ML injection Inject 1 mL (1,000 mcg total) into the muscle every 30 (thirty) days.  Arna Medici 175 MCG tablet Take 1 tablet by mouth once daily  . glimepiride (AMARYL) 2 MG tablet Take 1 tablet (2 mg total) by mouth 2 (two) times daily.  Marland Kitchen glucose blood (ONE TOUCH ULTRA TEST) test strip Test blood sugar BID Dx Code:  250.00 Pt uses Contour Meter  . LORazepam (ATIVAN) 1 MG tablet TAKE 1 TABLET BY MOUTH ONCE DAILY AS  NEEDED FOR ANXIETY  . metFORMIN (GLUCOPHAGE XR) 500 MG 24 hr tablet Two tablets bid  . metoprolol succinate (TOPROL-XL) 25 MG 24 hr tablet TAKE 1 TABLET BY MOUTH TWICE DAILY. NEEDS TO KEEP NEXT APPOINTMENT FOR FURTHER REFILLS.  Marland Kitchen pantoprazole (PROTONIX) 40 MG tablet TAKE 1 TABLET BY MOUTH TWICE DAILY WITH A MEAL  . rosuvastatin (CRESTOR) 20 MG tablet Take 1 tablet by mouth once daily  . SYRINGE-NEEDLE, DISP, 3 ML (LUER LOCK SAFETY SYRINGES) 25G X 1" 3 ML MISC Use to inject B12 q 30 days  . venlafaxine XR (EFFEXOR-XR) 150 MG 24 hr capsule Take 1 capsule by mouth once daily   No facility-administered encounter medications on file as of 08/17/2019.    Allergies (verified) Patient has no known allergies.   History: Past Medical History:  Diagnosis Date  . Breast cancer ([place]) 2005   s/p chemotherapy and XRT  . Depression   . Diabetes ([place])   . GERD (gastroesophageal reflux disease)   . Hx of vertigo   . Hypercholesterolemia   . Hypertension   . Hypothyroidism   . Polio 1948   history  . PONV (postoperative nausea and vomiting)    Past Surgical History:  Procedure Laterality Date  . BREAST BIOPSY Left 2005  . BREAST BIOPSY Left 10/25/2015   US guided biopsy  . BREAST LUMPECTOMY Left 2005  . EXCISION OF BREAST LESION Left 11/19/2015  Procedure: EXCISION OF BREAST LESION/ MASS;  Surgeon: Leonie Green, MD;  Location: [facility] ORS;  Service: General;  Laterality: Left;  . EYE SURGERY     cataracts bilateral  . TUBAL LIGATION     Family History  Problem Relation Age of Onset  . Cancer Mother        breast  . Breast cancer Mother 24  . Stroke Father   . Breast cancer Maternal Aunt    Social History   Socioeconomic History  . Marital status: Married    Spouse name: Not on file  . Number of children: Not on file  . Years of education: Not on file  . Highest education level: Not on file  Occupational History  . Not on file  Tobacco Use  . Smoking status: Never Smoker    . Smokeless tobacco: Never Used  Substance and Sexual Activity  . Alcohol use: No    Alcohol/week: 0.0 standard drinks  . Drug use: No  . Sexual activity: Never  Other Topics Concern  . Not on file  Social History Narrative  . Not on file   Social Determinants of Health   Financial Resource Strain: Low Risk   . Difficulty of Paying Living Expenses: Not very hard  Food Insecurity: No Food Insecurity  . Worried About Charity fundraiser in the Last Year: Never true  . Ran Out of Food in the Last Year: Never true  Transportation Needs: No Transportation Needs  . Lack of Transportation (Medical): No  . Lack of Transportation (Non-Medical): No  Physical Activity:   . Days of Exercise per Week:   . Minutes of Exercise per Session:   Stress: No Stress Concern Present  . Feeling of Stress : Not at all  Social Connections: Unknown  . Frequency of Communication with Friends and Family: Not on file  . Frequency of Social Gatherings with Friends and Family: Not on file  . Attends Religious Services: Not on file  . Active Member of Clubs or Organizations: Not on file  . Attends Archivist Meetings: Not on file  . Marital Status: Married    Tobacco Counseling Counseling given: Not Answered   Clinical Intake:  Pre-visit preparation completed: Yes        Diabetes: Yes (Followed by pcp)  How often do you need to have someone help you when you read instructions, pamphlets, or other written materials from your doctor or pharmacy?: 3 - Sometimes  Interpreter Needed?: No      Activities of Daily Living In your present state of health, do you have any difficulty performing the following activities: 08/17/2019  Hearing? N  Vision? N  Difficulty concentrating or making decisions? N  Comment Age appropriate  Walking or climbing stairs? N  Dressing or bathing? N  Doing errands, shopping? Y  Comment She does not Physiological scientist and eating ? N  Comment Son  assist with meal prep as needed  Using the Toilet? N  In the past six months, have you accidently leaked urine? N  Do you have problems with loss of bowel control? N  Managing your Medications? Y  Comment Son assist  Managing your Finances? Y  Comment Son Writer or managing your Housekeeping? N  Comment She paces herself.  Some recent data might be hidden    Patient Care Team: Einar Pheasant, MD as PCP - General (Internal Medicine)  Indicate any recent Medical Services you may have received  from other than [facility] providers in the past year (date may be approximate).     Assessment:   This is a routine wellness examination for Jessicia.  I connected with Eliannah today by telephone and verified that I am speaking with the correct person using two identifiers. Location patient: home Location provider: work Persons participating in the virtual visit: patient, son, Marine scientist.    I discussed the limitations, risks, security and privacy concerns of performing an evaluation and management service by telephone and the availability of in person appointments. The patient expressed understanding and verbally consented to this telephonic visit.    Interactive audio and video telecommunications were attempted between this provider and patient, however failed, due to patient having technical difficulties OR patient did not have access to video capability.  We continued and completed visit with audio only.  Some vital signs may be absent or patient reported.   Hearing/Vision screen  Hearing Screening   125Hz  250Hz  500Hz  1000Hz  2000Hz  3000Hz  4000Hz  6000Hz  8000Hz   Right ear:           Left ear:           Comments: Patient is able to hear conversational tones without difficulty.  No issues reported.  Vision Screening Comments: Followed by [facility] Wears corrective lenses Visual acuity not assessed, virtual visit.     Dietary issues and exercise activities discussed: Current  Exercise Habits: The patient does not participate in regular exercise at present  Regular diet  Goals    . Follow up with Primary Care Provider     As needed      Depression Screen PHQ 2/9 Scores 08/17/2019 12/12/2018 12/10/2015 09/18/2013 05/17/2012  PHQ - 2 Score 0 0 0 1 0  PHQ- 9 Score - 0 - - -    Fall Risk Fall Risk  08/17/2019 12/10/2015 10/04/2014 09/18/2013 05/17/2012  Falls in the past year? 1 No No Yes No  Number falls in past yr: 0 - - 2 or more -  Injury with Fall? 0 - - - -  Comment lost her footing while walking outdoors in the garden. No injury. - - - -  Follow up Falls evaluation completed - - - -   Handrails in use when climbing stairs? Yes  Home free of loose throw rugs in walkways, pet beds, electrical cords, etc? Yes  Adequate lighting in your home to reduce risk of falls? Yes   ASSISTIVE DEVICES UTILIZED TO PREVENT FALLS:  Life alert? No  Use of a cane, walker or w/c? No  Grab bars in the bathroom? No  Shower chair or bench in shower? No  Elevated toilet seat or a handicapped toilet? Yes   TIMED UP AND GO:  Was the test performed? No . Virtual visit.   Cognitive Function:  Patient is alert and oriented x3.   6CIT Screen 08/17/2019  What Year? 0 points  What month? 0 points  What time? 0 points    Immunizations Immunization History  Administered Date(s) Administered  . Influenza, High Dose Seasonal PF 11/10/2016  . Influenza,inj,Quad PF,6+ Mos 10/04/2014  . Influenza-Unspecified 11/22/2006  . Pneumococcal Conjugate-13 04/12/2014  . Pneumococcal Polysaccharide-23 11/10/2016    TDAP status: Due, Education has been provided regarding the importance of this vaccine. Advised may receive this vaccine at local pharmacy or Health Dept. Aware to provide a copy of the vaccination record if obtained from local pharmacy or Health Dept. Verbalized acceptance and understanding. Deferred.   Mammogram- scheduled 08/18/19.  Eye exam- plans to schedule.  Covid  vaccine- declined.   Hepatitis C Screening- deferred   Influenza vaccine- out of stock.  Foot exam- denies wounds, numbness. Notes feet are tender at night only with burning sensation, onset a few years ago and usually passes with time. Foot lotion in use which aids in soothing. Encouraged diabetic follow up. Declined scheduling until December unless otherwise directed by PMD.   Health Maintenance Health Maintenance  Topic Date Due  . OPHTHALMOLOGY EXAM  Never done  . FOOT EXAM  11/10/2017  . HEMOGLOBIN A1C  06/08/2019  . INFLUENZA VACCINE  11/17/2019 (Originally 08/06/2019)  . COVID-19 Vaccine (1) 01/05/2020 (Originally 07/14/1952)  . TETANUS/TDAP  08/16/2020 (Originally 07/15/1959)  . Hepatitis C Screening  08/16/2020 (Originally 03/26/1940)  . URINE MICROALBUMIN  12/08/2019  . DEXA SCAN  Completed  . PNA vac Low Risk Adult  Completed   Dental Screening: Recommended annual dental exams for proper oral hygiene.   Community Resource Referral / Chronic Care Management: CRR required this visit?  No   CCM required this visit?  No      Plan:   Keep all routine maintenance appointments.   Next scheduled fasting lab 08/18/19.   Annual follow up 12/14/19 @ 10:30  I have personally reviewed and noted the following in the patient's chart:   . Medical and social history . Use of alcohol, tobacco or illicit drugs  . Current medications and supplements . Functional ability and status . Nutritional status . Physical activity . Advanced directives . List of other physicians . Hospitalizations, surgeries, and ER visits in previous 12 months . Vitals . Screenings to include cognitive, depression, and falls . Referrals and appointments  In addition, I have reviewed and discussed with patient certain preventive protocols, quality metrics, and best practice recommendations. A written personalized care plan for preventive services as well as general preventive health recommendations were  provided to patient via mychart.     Varney Biles, LPN   [id number]

## 2019-08-17 NOTE — Patient Instructions (Addendum)
Joyce Maynard , Thank you for taking time to come for your Medicare Wellness Visit. I appreciate your ongoing commitment to your health goals. Please review the following plan we discussed and let me know if I can assist you in the future.   These are the goals we discussed: Goals    . Follow up with Primary Care Provider     As needed       This is a list of the screening recommended for you and due dates:  Health Maintenance  Topic Date Due  . Eye exam for diabetics  Never done  . Complete foot exam   11/10/2017  . Hemoglobin A1C  06/08/2019  . Flu Shot  11/17/2019*  . COVID-19 Vaccine (1) 01/05/2020*  . Tetanus Vaccine  08/16/2020*  .  Hepatitis C: One time screening is recommended by Center for Disease Control  (CDC) for  adults born from [birth year] through 1965.   08/16/2020*  . Urine Protein Check  12/08/2019  . DEXA scan (bone density measurement)  Completed  . Pneumonia vaccines  Completed  *Topic was postponed. The date shown is not the original due date.    Immunizations Immunization History  Administered Date(s) Administered  . Influenza, High Dose Seasonal PF 11/10/2016  . Influenza,inj,Quad PF,6+ Mos 10/04/2014  . Influenza-Unspecified 11/22/2006  . Pneumococcal Conjugate-13 04/12/2014  . Pneumococcal Polysaccharide-23 11/10/2016   Keep all routine maintenance appointments.   Next scheduled fasting lab 08/18/19.   Annual follow up 12/14/19 @ 10:30  Advanced directives: yes on file  Follow up in one year for your annual wellness visit    Preventive Care 65 Years and Older, Female Preventive care refers to lifestyle choices and visits with your health care provider that can promote health and wellness. What does preventive care include?  A yearly physical exam. This is also called an annual well check.  Dental exams once or twice a year.  Routine eye exams. Ask your health care provider how often you should have your eyes checked.  Personal lifestyle choices,  including:  Daily care of your teeth and gums.  Regular physical activity.  Eating a healthy diet.  Avoiding tobacco and drug use.  Limiting alcohol use.  Practicing safe sex.  Taking low-dose aspirin every day.  Taking vitamin and mineral supplements as recommended by your health care provider. What happens during an annual well check? The services and screenings done by your health care provider during your annual well check will depend on your age, overall health, lifestyle risk factors, and family history of disease. Counseling  Your health care provider may ask you questions about your:  Alcohol use.  Tobacco use.  Drug use.  Emotional well-being.  Home and relationship well-being.  Sexual activity.  Eating habits.  History of falls.  Memory and ability to understand (cognition).  Work and work Statistician.  Reproductive health. Screening  You may have the following tests or measurements:  Height, weight, and BMI.  Blood pressure.  Lipid and cholesterol levels. These may be checked every 5 years, or more frequently if you are over 60 years old.  Skin check.  Lung cancer screening. You may have this screening every year starting at age 10 if you have a 30-pack-year history of smoking and currently smoke or have quit within the past 15 years.  Fecal occult blood test (FOBT) of the stool. You may have this test every year starting at age 44.  Flexible sigmoidoscopy or colonoscopy. You may have a  sigmoidoscopy every 5 years or a colonoscopy every 10 years starting at age 2.  Hepatitis C blood test.  Hepatitis B blood test.  Sexually transmitted disease (STD) testing.  Diabetes screening. This is done by checking your blood sugar (glucose) after you have not eaten for a while (fasting). You may have this done every 1-3 years.  Bone density scan. This is done to screen for osteoporosis. You may have this done starting at age 52.  Mammogram. This  may be done every 1-2 years. Talk to your health care provider about how often you should have regular mammograms. Talk with your health care provider about your test results, treatment options, and if necessary, the need for more tests. Vaccines  Your health care provider may recommend certain vaccines, such as:  Influenza vaccine. This is recommended every year.  Tetanus, diphtheria, and acellular pertussis (Tdap, Td) vaccine. You may need a Td booster every 10 years.  Zoster vaccine. You may need this after age 13.  Pneumococcal 13-valent conjugate (PCV13) vaccine. One dose is recommended after age 83.  Pneumococcal polysaccharide (PPSV23) vaccine. One dose is recommended after age 15. Talk to your health care provider about which screenings and vaccines you need and how often you need them. This information is not intended to replace advice given to you by your health care provider. Make sure you discuss any questions you have with your health care provider. Document Released: 01/18/2015 Document Revised: 09/11/2015 Document Reviewed: 10/23/2014 Elsevier Interactive Patient Education  2017 [place] Prevention in the Home Falls can cause injuries. They can happen to people of all ages. There are many things you can do to make your home safe and to help prevent falls. What can I do on the outside of my home?  Regularly fix the edges of walkways and driveways and fix any cracks.  Remove anything that might make you trip as you walk through a door, such as a raised step or threshold.  Trim any bushes or trees on the path to your home.  Use bright outdoor lighting.  Clear any walking paths of anything that might make someone trip, such as rocks or tools.  Regularly check to see if handrails are loose or broken. Make sure that both sides of any steps have handrails.  Any raised decks and porches should have guardrails on the edges.  Have any leaves, snow, or ice cleared  regularly.  Use sand or salt on walking paths during winter.  Clean up any spills in your garage right away. This includes oil or grease spills. What can I do in the bathroom?  Use night lights.  Install grab bars by the toilet and in the tub and shower. Do not use towel bars as grab bars.  Use non-skid mats or decals in the tub or shower.  If you need to sit down in the shower, use a plastic, non-slip stool.  Keep the floor dry. Clean up any water that spills on the floor as soon as it happens.  Remove soap buildup in the tub or shower regularly.  Attach bath mats securely with double-sided non-slip rug tape.  Do not have throw rugs and other things on the floor that can make you trip. What can I do in the bedroom?  Use night lights.  Make sure that you have a light by your bed that is easy to reach.  Do not use any sheets or blankets that are too big for your bed. They  should not hang down onto the floor.  Have a firm chair that has side arms. You can use this for support while you get dressed.  Do not have throw rugs and other things on the floor that can make you trip. What can I do in the kitchen?  Clean up any spills right away.  Avoid walking on wet floors.  Keep items that you use a lot in easy-to-reach places.  If you need to reach something above you, use a strong step stool that has a grab bar.  Keep electrical cords out of the way.  Do not use floor polish or wax that makes floors slippery. If you must use wax, use non-skid floor wax.  Do not have throw rugs and other things on the floor that can make you trip. What can I do with my stairs?  Do not leave any items on the stairs.  Make sure that there are handrails on both sides of the stairs and use them. Fix handrails that are broken or loose. Make sure that handrails are as long as the stairways.  Check any carpeting to make sure that it is firmly attached to the stairs. Fix any carpet that is loose  or worn.  Avoid having throw rugs at the top or bottom of the stairs. If you do have throw rugs, attach them to the floor with carpet tape.  Make sure that you have a light switch at the top of the stairs and the bottom of the stairs. If you do not have them, ask someone to add them for you. What else can I do to help prevent falls?  Wear shoes that:  Do not have high heels.  Have rubber bottoms.  Are comfortable and fit you well.  Are closed at the toe. Do not wear sandals.  If you use a stepladder:  Make sure that it is fully opened. Do not climb a closed stepladder.  Make sure that both sides of the stepladder are locked into place.  Ask someone to hold it for you, if possible.  Clearly mark and make sure that you can see:  Any grab bars or handrails.  First and last steps.  Where the edge of each step is.  Use tools that help you move around (mobility aids) if they are needed. These include:  Canes.  Walkers.  Scooters.  Crutches.  Turn on the lights when you go into a dark area. Replace any light bulbs as soon as they burn out.  Set up your furniture so you have a clear path. Avoid moving your furniture around.  If any of your floors are uneven, fix them.  If there are any pets around you, be aware of where they are.  Review your medicines with your doctor. Some medicines can make you feel dizzy. This can increase your chance of falling. Ask your doctor what other things that you can do to help prevent falls. This information is not intended to replace advice given to you by your health care provider. Make sure you discuss any questions you have with your health care provider. Document Released: 10/18/2008 Document Revised: 05/30/2015 Document Reviewed: 01/26/2014 Elsevier Interactive Patient Education  2017 [employer].

## 2019-08-18 ENCOUNTER — Other Ambulatory Visit: Payer: Medicare HMO

## 2019-08-25 ENCOUNTER — Other Ambulatory Visit: Payer: Self-pay | Admitting: Internal Medicine

## 2019-08-29 NOTE — Telephone Encounter (Signed)
rx sent in for lorazepam #30 with no refills.  She has an appt scheduled in 12/2018.  Needs to be seen sooner.  Needs f/u appt with me within the next few weeks.

## 2019-10-09 ENCOUNTER — Telehealth: Payer: Self-pay | Admitting: Internal Medicine

## 2019-10-09 NOTE — Telephone Encounter (Signed)
Joyce Maynard has a history of breast cancer. Is overdue mammogram.  Please see if she is agreeable for mammogram and schedule.  Thanks.

## 2019-10-13 NOTE — Telephone Encounter (Signed)
Son is aware and stated he will call and schedule. Advised that he did not need number- he has it already.

## 2019-10-17 ENCOUNTER — Emergency Department: Payer: Medicare HMO

## 2019-10-17 ENCOUNTER — Other Ambulatory Visit: Payer: Self-pay

## 2019-10-17 ENCOUNTER — Inpatient Hospital Stay
Admission: EM | Admit: 2019-10-17 | Discharge: 2019-10-23 | DRG: 065 | Disposition: A | Discharge status: Skilled Nursing Facility | Payer: Medicare HMO | Attending: Internal Medicine | Admitting: Internal Medicine

## 2019-10-17 DIAGNOSIS — E7800 Pure hypercholesterolemia, unspecified: Secondary | ICD-10-CM | POA: Diagnosis present

## 2019-10-17 DIAGNOSIS — N1831 Chronic kidney disease, stage 3a: Secondary | ICD-10-CM | POA: Diagnosis present

## 2019-10-17 DIAGNOSIS — Z9119 Patient's noncompliance with other medical treatment and regimen: Secondary | ICD-10-CM

## 2019-10-17 DIAGNOSIS — E876 Hypokalemia: Secondary | ICD-10-CM | POA: Diagnosis present

## 2019-10-17 DIAGNOSIS — Z79899 Other long term (current) drug therapy: Secondary | ICD-10-CM

## 2019-10-17 DIAGNOSIS — I129 Hypertensive chronic kidney disease with stage 1 through stage 4 chronic kidney disease, or unspecified chronic kidney disease: Secondary | ICD-10-CM | POA: Diagnosis present

## 2019-10-17 DIAGNOSIS — R7989 Other specified abnormal findings of blood chemistry: Secondary | ICD-10-CM

## 2019-10-17 DIAGNOSIS — S22000A Wedge compression fracture of unspecified thoracic vertebra, initial encounter for closed fracture: Secondary | ICD-10-CM

## 2019-10-17 DIAGNOSIS — I639 Cerebral infarction, unspecified: Secondary | ICD-10-CM | POA: Diagnosis not present

## 2019-10-17 DIAGNOSIS — K219 Gastro-esophageal reflux disease without esophagitis: Secondary | ICD-10-CM | POA: Diagnosis present

## 2019-10-17 DIAGNOSIS — W19XXXA Unspecified fall, initial encounter: Secondary | ICD-10-CM | POA: Diagnosis present

## 2019-10-17 DIAGNOSIS — F419 Anxiety disorder, unspecified: Secondary | ICD-10-CM | POA: Diagnosis present

## 2019-10-17 DIAGNOSIS — M4854XA Collapsed vertebra, not elsewhere classified, thoracic region, initial encounter for fracture: Secondary | ICD-10-CM | POA: Diagnosis present

## 2019-10-17 DIAGNOSIS — G8191 Hemiplegia, unspecified affecting right dominant side: Secondary | ICD-10-CM | POA: Diagnosis present

## 2019-10-17 DIAGNOSIS — Z7982 Long term (current) use of aspirin: Secondary | ICD-10-CM

## 2019-10-17 DIAGNOSIS — R531 Weakness: Secondary | ICD-10-CM | POA: Diagnosis not present

## 2019-10-17 DIAGNOSIS — E039 Hypothyroidism, unspecified: Secondary | ICD-10-CM | POA: Diagnosis present

## 2019-10-17 DIAGNOSIS — Z79890 Hormone replacement therapy (postmenopausal): Secondary | ICD-10-CM

## 2019-10-17 DIAGNOSIS — R112 Nausea with vomiting, unspecified: Secondary | ICD-10-CM | POA: Diagnosis present

## 2019-10-17 DIAGNOSIS — F418 Other specified anxiety disorders: Secondary | ICD-10-CM | POA: Diagnosis present

## 2019-10-17 DIAGNOSIS — E785 Hyperlipidemia, unspecified: Secondary | ICD-10-CM | POA: Diagnosis present

## 2019-10-17 DIAGNOSIS — E1129 Type 2 diabetes mellitus with other diabetic kidney complication: Secondary | ICD-10-CM | POA: Diagnosis present

## 2019-10-17 DIAGNOSIS — E1136 Type 2 diabetes mellitus with diabetic cataract: Secondary | ICD-10-CM | POA: Diagnosis present

## 2019-10-17 DIAGNOSIS — R29705 NIHSS score 5: Secondary | ICD-10-CM | POA: Diagnosis present

## 2019-10-17 DIAGNOSIS — Z8673 Personal history of transient ischemic attack (TIA), and cerebral infarction without residual deficits: Secondary | ICD-10-CM | POA: Diagnosis present

## 2019-10-17 DIAGNOSIS — Z20822 Contact with and (suspected) exposure to covid-19: Secondary | ICD-10-CM | POA: Diagnosis present

## 2019-10-17 DIAGNOSIS — I6381 Other cerebral infarction due to occlusion or stenosis of small artery: Secondary | ICD-10-CM | POA: Diagnosis not present

## 2019-10-17 DIAGNOSIS — I10 Essential (primary) hypertension: Secondary | ICD-10-CM | POA: Diagnosis not present

## 2019-10-17 DIAGNOSIS — Z853 Personal history of malignant neoplasm of breast: Secondary | ICD-10-CM

## 2019-10-17 DIAGNOSIS — E1122 Type 2 diabetes mellitus with diabetic chronic kidney disease: Secondary | ICD-10-CM | POA: Diagnosis present

## 2019-10-17 DIAGNOSIS — Z9221 Personal history of antineoplastic chemotherapy: Secondary | ICD-10-CM

## 2019-10-17 DIAGNOSIS — Z823 Family history of stroke: Secondary | ICD-10-CM

## 2019-10-17 DIAGNOSIS — Z7984 Long term (current) use of oral hypoglycemic drugs: Secondary | ICD-10-CM

## 2019-10-17 DIAGNOSIS — S2239XA Fracture of one rib, unspecified side, initial encounter for closed fracture: Secondary | ICD-10-CM | POA: Diagnosis present

## 2019-10-17 DIAGNOSIS — E1165 Type 2 diabetes mellitus with hyperglycemia: Secondary | ICD-10-CM | POA: Diagnosis present

## 2019-10-17 DIAGNOSIS — F32A Depression, unspecified: Secondary | ICD-10-CM | POA: Diagnosis present

## 2019-10-17 DIAGNOSIS — Z803 Family history of malignant neoplasm of breast: Secondary | ICD-10-CM

## 2019-10-17 DIAGNOSIS — Z7902 Long term (current) use of antithrombotics/antiplatelets: Secondary | ICD-10-CM

## 2019-10-17 DIAGNOSIS — I6523 Occlusion and stenosis of bilateral carotid arteries: Secondary | ICD-10-CM | POA: Diagnosis present

## 2019-10-17 DIAGNOSIS — R197 Diarrhea, unspecified: Secondary | ICD-10-CM | POA: Diagnosis present

## 2019-10-17 DIAGNOSIS — Z9114 Patient's other noncompliance with medication regimen: Secondary | ICD-10-CM

## 2019-10-17 DIAGNOSIS — Z8781 Personal history of (healed) traumatic fracture: Secondary | ICD-10-CM | POA: Diagnosis present

## 2019-10-17 LAB — COMPREHENSIVE METABOLIC PANEL
ALT: 20 U/L (ref 0–44)
AST: 39 U/L (ref 15–41)
Albumin: 4.1 g/dL (ref 3.5–5.0)
Alkaline Phosphatase: 102 U/L (ref 38–126)
Anion gap: 11 (ref 5–15)
BUN: 16 mg/dL (ref 8–23)
CO2: 24 mmol/L (ref 22–32)
Calcium: 9.4 mg/dL (ref 8.9–10.3)
Chloride: 105 mmol/L (ref 98–111)
Creatinine, Ser: 1.12 mg/dL — ABNORMAL HIGH (ref 0.44–1.00)
GFR, Estimated: 47 mL/min — ABNORMAL LOW (ref >60)
Glucose, Bld: 184 mg/dL — ABNORMAL HIGH (ref 70–99)
Potassium: 3.3 mmol/L — ABNORMAL LOW (ref 3.5–5.1)
Sodium: 140 mmol/L (ref 135–145)
Total Bilirubin: 0.9 mg/dL (ref 0.3–1.2)
Total Protein: 8 g/dL (ref 6.5–8.1)

## 2019-10-17 LAB — GLUCOSE, CAPILLARY
Glucose-Capillary: 138 mg/dL — ABNORMAL HIGH (ref 70–99)
Glucose-Capillary: 102 mg/dL — ABNORMAL HIGH (ref 70–99)
Glucose-Capillary: 124 mg/dL — ABNORMAL HIGH (ref 70–99)

## 2019-10-17 LAB — DIFFERENTIAL
Abs Immature Granulocytes: 0.03 10*3/uL (ref 0.00–0.07)
Basophils Absolute: 0 10*3/uL (ref 0.0–0.1)
Basophils Relative: 0 %
Eosinophils Absolute: 0.1 10*3/uL (ref 0.0–0.5)
Eosinophils Relative: 2 %
Immature Granulocytes: 0 %
Lymphocytes Relative: 17 %
Lymphs Abs: 1.3 10*3/uL (ref 0.7–4.0)
Monocytes Absolute: 0.5 10*3/uL (ref 0.1–1.0)
Monocytes Relative: 7 %
Neutro Abs: 5.6 10*3/uL (ref 1.7–7.7)
Neutrophils Relative %: 74 %

## 2019-10-17 LAB — TROPONIN I (HIGH SENSITIVITY)
Troponin I (High Sensitivity): 9 ng/L (ref <18)
Troponin I (High Sensitivity): 6 ng/L (ref <18)

## 2019-10-17 LAB — CBC
HCT: 42.3 % (ref 36.0–46.0)
Hemoglobin: 14.5 g/dL (ref 12.0–15.0)
MCH: 34.9 pg — ABNORMAL HIGH (ref 26.0–34.0)
MCHC: 34.3 g/dL (ref 30.0–36.0)
MCV: 101.9 fL — ABNORMAL HIGH (ref 80.0–100.0)
Platelets: 189 10*3/uL (ref 150–400)
RBC: 4.15 MIL/uL (ref 3.87–5.11)
RDW: 14.2 % (ref 11.5–15.5)
WBC: 7.6 10*3/uL (ref 4.0–10.5)
nRBC: 0 % (ref 0.0–0.2)

## 2019-10-17 LAB — HEMOGLOBIN A1C
Hgb A1c MFr Bld: 7.3 % — ABNORMAL HIGH (ref 4.8–5.6)
Mean Plasma Glucose: 162.81 mg/dL

## 2019-10-17 LAB — RESPIRATORY PANEL BY RT PCR (FLU A&B, COVID)
Influenza A by PCR: NEGATIVE
Influenza B by PCR: NEGATIVE
SARS Coronavirus 2 by RT PCR: NEGATIVE

## 2019-10-17 LAB — LIPASE, BLOOD: Lipase: 20 U/L (ref 11–51)

## 2019-10-17 LAB — TSH: TSH: 44.545 u[IU]/mL — ABNORMAL HIGH (ref 0.350–4.500)

## 2019-10-17 LAB — MAGNESIUM: Magnesium: 2 mg/dL (ref 1.7–2.4)

## 2019-10-17 LAB — PROTIME-INR
INR: 0.9 (ref 0.8–1.2)
Prothrombin Time: 11.6 seconds (ref 11.4–15.2)

## 2019-10-17 LAB — APTT: aPTT: 27 seconds (ref 24–36)

## 2019-10-17 MED ORDER — ACETAMINOPHEN 325 MG PO TABS: 650.0000 mg | ORAL_TABLET | ORAL | Status: DC | PRN

## 2019-10-17 MED ORDER — OXYCODONE-ACETAMINOPHEN 5-325 MG PO TABS
1.0000 | ORAL_TABLET | ORAL | Status: DC | PRN
  Administered 2019-10-17 – 2019-10-23 (×9): 1 via ORAL
  Filled 2019-10-17 (×9): qty 1

## 2019-10-17 MED ORDER — INSULIN ASPART 100 UNIT/ML ~~LOC~~ SOLN: 0.0000 [IU] | SUBCUTANEOUS | Status: DC

## 2019-10-17 MED ORDER — LEVOTHYROXINE SODIUM 50 MCG PO TABS
175.0000 ug | ORAL_TABLET | Freq: Every day | ORAL | Status: DC
  Administered 2019-10-18 – 2019-10-23 (×6): 175 ug via ORAL
  Filled 2019-10-17 (×6): qty 1

## 2019-10-17 MED ORDER — METOPROLOL SUCCINATE ER 25 MG PO TB24
25.0000 mg | ORAL_TABLET | Freq: Every day | ORAL | Status: DC
  Administered 2019-10-18 – 2019-10-23 (×6): 25 mg via ORAL
  Filled 2019-10-17 (×7): qty 1

## 2019-10-17 MED ORDER — LORAZEPAM 0.5 MG PO TABS
0.5000 mg | ORAL_TABLET | Freq: Two times a day (BID) | ORAL | Status: DC | PRN
  Administered 2019-10-18: 0.5 mg via ORAL
  Filled 2019-10-17: qty 1

## 2019-10-17 MED ORDER — SENNOSIDES-DOCUSATE SODIUM 8.6-50 MG PO TABS: 1.0000 | ORAL_TABLET | Freq: Every evening | ORAL | Status: DC | PRN

## 2019-10-17 MED ORDER — ACETAMINOPHEN 160 MG/5ML PO SOLN
650.0000 mg | ORAL | Status: DC | PRN
  Filled 2019-10-17: qty 20.3

## 2019-10-17 MED ORDER — STROKE: EARLY STAGES OF RECOVERY BOOK: Freq: Once | Status: AC

## 2019-10-17 MED ORDER — PANTOPRAZOLE SODIUM 40 MG PO TBEC: 40.0000 mg | DELAYED_RELEASE_TABLET | Freq: Every day | ORAL | Status: DC | PRN

## 2019-10-17 MED ORDER — CLOPIDOGREL BISULFATE 75 MG PO TABS
75.0000 mg | ORAL_TABLET | Freq: Every day | ORAL | Status: DC
  Administered 2019-10-18 – 2019-10-23 (×6): 75 mg via ORAL
  Filled 2019-10-17 (×7): qty 1

## 2019-10-17 MED ORDER — ONDANSETRON HCL 4 MG/2ML IJ SOLN
4.0000 mg | Freq: Three times a day (TID) | INTRAMUSCULAR | Status: DC | PRN
  Administered 2019-10-21: 4 mg via INTRAVENOUS
  Filled 2019-10-17: qty 2

## 2019-10-17 MED ORDER — ROSUVASTATIN CALCIUM 10 MG PO TABS
20.0000 mg | ORAL_TABLET | Freq: Every day | ORAL | Status: DC
  Administered 2019-10-17 – 2019-10-18 (×2): 20 mg via ORAL
  Filled 2019-10-17 (×2): qty 2

## 2019-10-17 MED ORDER — POTASSIUM CHLORIDE CRYS ER 20 MEQ PO TBCR
40.0000 meq | EXTENDED_RELEASE_TABLET | Freq: Once | ORAL | Status: AC
  Administered 2019-10-17: 40 meq via ORAL
  Filled 2019-10-17: qty 2

## 2019-10-17 MED ORDER — HYDRALAZINE HCL 20 MG/ML IJ SOLN
5.0000 mg | INTRAMUSCULAR | Status: DC | PRN
  Administered 2019-10-17: 5 mg via INTRAVENOUS
  Filled 2019-10-17: qty 1

## 2019-10-17 MED ORDER — ACETAMINOPHEN 650 MG RE SUPP: 650.0000 mg | RECTAL | Status: DC | PRN

## 2019-10-17 MED ORDER — INSULIN ASPART 100 UNIT/ML ~~LOC~~ SOLN
0.0000 [IU] | Freq: Three times a day (TID) | SUBCUTANEOUS | Status: DC
  Administered 2019-10-18 (×3): 2 [IU] via SUBCUTANEOUS
  Administered 2019-10-19: 3 [IU] via SUBCUTANEOUS
  Administered 2019-10-19 – 2019-10-20 (×4): 1 [IU] via SUBCUTANEOUS
  Administered 2019-10-20 – 2019-10-21 (×3): 3 [IU] via SUBCUTANEOUS
  Administered 2019-10-21: 2 [IU] via SUBCUTANEOUS
  Administered 2019-10-22: 5 [IU] via SUBCUTANEOUS
  Administered 2019-10-22: 1 [IU] via SUBCUTANEOUS
  Administered 2019-10-22: 3 [IU] via SUBCUTANEOUS
  Administered 2019-10-23: 1 [IU] via SUBCUTANEOUS
  Administered 2019-10-23: 2 [IU] via SUBCUTANEOUS
  Filled 2019-10-17 (×17): qty 1

## 2019-10-17 MED ORDER — ASPIRIN EC 81 MG PO TBEC
81.0000 mg | DELAYED_RELEASE_TABLET | Freq: Every day | ORAL | Status: DC
  Administered 2019-10-18 – 2019-10-23 (×6): 81 mg via ORAL
  Filled 2019-10-17 (×7): qty 1

## 2019-10-17 MED ORDER — SODIUM CHLORIDE 0.9% FLUSH
3.0000 mL | Freq: Once | INTRAVENOUS | Status: AC
  Administered 2019-10-17: 3 mL via INTRAVENOUS

## 2019-10-17 MED ORDER — ENOXAPARIN SODIUM 40 MG/0.4ML ~~LOC~~ SOLN
40.0000 mg | SUBCUTANEOUS | Status: DC
  Administered 2019-10-17: 40 mg via SUBCUTANEOUS
  Filled 2019-10-17: qty 0.4

## 2019-10-17 MED ORDER — ACETAMINOPHEN 325 MG PO TABS: 650.0000 mg | ORAL_TABLET | Freq: Four times a day (QID) | ORAL | Status: DC | PRN

## 2019-10-17 MED ORDER — INSULIN ASPART 100 UNIT/ML ~~LOC~~ SOLN: 0.0000 [IU] | Freq: Every day | SUBCUTANEOUS | Status: DC

## 2019-10-17 NOTE — Progress Notes (Signed)
Pt arrived to floor via stretcher in apparent stable condition with son at bedside, orders briefly explained, son verbalizes understanding about visiting hours. Pt is A&Ox3 denies any needs at this time daughter getting outside food for patient. No s/s of distress

## 2019-10-17 NOTE — ED Triage Notes (Addendum)
Pt comes via POV from home with c/o right sided weakness that started few days ago. Pt states right sided weakness to arm and leg. Pt states some numbness to arm and leg. Pt states dizziness.  Pt denies any CP, SOB or blurred vision  Pt has noticeable drift with right arm.

## 2019-10-17 NOTE — Consult Note (Signed)
Requesting Physician: Blaine Hamper    Chief Complaint: Right sided weakness and numbness  I have been asked by Dr. Blaine Hamper to see this patient in consultation for acute infarct.  HPI: Joyce ADRIANCE is an 79 y.o. female with a history of HTN, HLD and DM who presents with a 5-6 day history of right hemiparesis.  Patient reports going to bed either Wednesday or Thursday of last week and awakening the next day with right sided weakness and numbness.  With no improvement she was brought to the ED today.  Initial NIHSS of 5.   On ASA and statin prior to admission.  Date last known well: Unable to determine Time last known well: Unable to determine tPA Given: No: Outside time window  Past Medical History:  Diagnosis Date  . Breast cancer ([place]) 2005   s/p chemotherapy and XRT  . Depression   . Diabetes ([place])   . GERD (gastroesophageal reflux disease)   . Hx of vertigo   . Hypercholesterolemia   . Hypertension   . Hypothyroidism   . Polio 1948   history  . PONV (postoperative nausea and vomiting)     Past Surgical History:  Procedure Laterality Date  . BREAST BIOPSY Left 2005  . BREAST BIOPSY Left 10/25/2015   US guided biopsy  . BREAST LUMPECTOMY Left 2005  . EXCISION OF BREAST LESION Left 11/19/2015   Procedure: EXCISION OF BREAST LESION/ MASS;  Surgeon: Leonie Green, MD;  Location: [facility] ORS;  Service: General;  Laterality: Left;  . EYE SURGERY     cataracts bilateral  . TUBAL LIGATION      Family History  Problem Relation Age of Onset  . Cancer Mother        breast  . Breast cancer Mother 2  . Stroke Father   . Breast cancer Maternal Aunt    Social History:  reports that she has never smoked. She has never used smokeless tobacco. She reports that she does not drink alcohol and does not use drugs.  Allergies: No Known Allergies  Medications: I have reviewed the patient's current medications. Prior to Admission medications   Medication Sig Start Date End Date Taking?  Authorizing Provider  aspirin EC 81 MG tablet Take 81 mg by mouth daily. Has stopped prior to procedure   Yes [provider]  EUTHYROX 175 MCG tablet Take 1 tablet by mouth once daily 08/28/19  Yes Einar Pheasant, MD  glimepiride (AMARYL) 2 MG tablet Take 1 tablet (2 mg total) by mouth 2 (two) times daily. Patient taking differently: Take 2 mg by mouth daily with breakfast.  12/16/17  Yes Crecencio Mc, MD  metoprolol succinate (TOPROL-XL) 25 MG 24 hr tablet TAKE 1 TABLET BY MOUTH TWICE DAILY. NEEDS TO KEEP NEXT APPOINTMENT FOR FURTHER REFILLS. Patient taking differently: Take 25 mg by mouth daily.  06/17/17  Yes Einar Pheasant, MD  acetaminophen (TYLENOL) 500 MG tablet Take 1,000 mg by mouth every 6 (six) hours as needed for mild pain.    [provider]  BD ULTRA-FINE LANCETS lancets Test blood glucose BID DX code 250.00 08/01/13   Einar Pheasant, MD  cyanocobalamin (,VITAMIN B-12,) 1000 MCG/ML injection Inject 1 mL (1,000 mcg total) into the muscle every 30 (thirty) days. Patient not taking: Reported on 10/17/2019 02/16/19   Einar Pheasant, MD  glucose blood (ONE TOUCH ULTRA TEST) test strip Test blood sugar BID Dx Code:  250.00 Pt uses Contour Meter 08/01/13   Einar Pheasant, MD  LORazepam (ATIVAN) 1 MG tablet TAKE 1 TABLET BY MOUTH ONCE DAILY AS NEEDED FOR ANXIETY 08/29/19   Einar Pheasant, MD  metFORMIN (GLUCOPHAGE XR) 500 MG 24 hr tablet Two tablets bid Patient not taking: Reported on 10/17/2019 12/16/17   Crecencio Mc, MD  pantoprazole (PROTONIX) 40 MG tablet TAKE 1 TABLET BY MOUTH TWICE DAILY WITH A MEAL Patient not taking: Reported on 10/17/2019 08/28/19   Einar Pheasant, MD  rosuvastatin (CRESTOR) 20 MG tablet Take 1 tablet by mouth once daily Patient not taking: Reported on 10/17/2019 01/17/19   Einar Pheasant, MD  SYRINGE-NEEDLE, DISP, 3 ML ([place]) 25G X 1" 3 ML MISC Use to inject B12 q 30 days 01/31/19   Einar Pheasant, MD  venlafaxine  XR (EFFEXOR-XR) 150 MG 24 hr capsule Take 1 capsule by mouth once daily Patient not taking: Reported on 10/17/2019 08/28/19   Einar Pheasant, MD    ROS: History obtained from the patient  General ROS: negative for - chills, fatigue, fever, night sweats, weight gain or weight loss Psychological ROS: negative for - behavioral disorder, hallucinations, memory difficulties, mood swings or suicidal ideation Ophthalmic ROS: negative for - blurry vision, double vision, eye pain or loss of vision ENT ROS: negative for - epistaxis, nasal discharge, oral lesions, sore throat, tinnitus or vertigo Allergy and Immunology ROS: negative for - hives or itchy/watery eyes Hematological and Lymphatic ROS: negative for - bleeding problems, bruising or swollen lymph nodes Endocrine ROS: negative for - galactorrhea, hair pattern changes, polydipsia/polyuria or temperature intolerance Respiratory ROS: negative for - cough, hemoptysis, shortness of breath or wheezing Cardiovascular ROS: negative for - chest pain, dyspnea on exertion, edema or irregular heartbeat Gastrointestinal ROS: negative for - abdominal pain, diarrhea, hematemesis, nausea/vomiting or stool incontinence Genito-Urinary ROS: negative for - dysuria, hematuria, incontinence or urinary frequency/urgency Musculoskeletal ROS: negative for - joint swelling or muscular weakness Neurological ROS: as noted in HPI Dermatological ROS: negative for rash and skin lesion changes  Physical Examination: Blood pressure (!) 184/121, pulse (!) 51, temperature 98.3 F (36.8 C), temperature source Oral, resp. rate 16, height 5\' 7"  (1.702 m), weight 93 kg, SpO2 100 %.  HEENT-  Normocephalic, no lesions, without obvious abnormality.  Normal external eye and conjunctiva.  Normal TM's bilaterally.  Normal auditory canals and external ears. Normal external nose, mucus membranes and septum.  Normal pharynx. Cardiovascular- S1, S2 normal, pulses palpable throughout    Lungs- chest clear, no wheezing, rales, normal symmetric air entry Abdomen- soft, non-tender; bowel sounds normal; no masses,  no organomegaly Extremities- no edema Lymph-no adenopathy palpable Musculoskeletal-no joint tenderness, deformity or swelling Skin-warm and dry, no hyperpigmentation, vitiligo, or suspicious lesions  Neurological Examination   Mental Status: Alert, oriented, thought content appropriate.  Speech fluent without evidence of aphasia.  Able to follow 3 step commands without difficulty. Cranial Nerves: II: Discs flat bilaterally; Visual fields grossly normal, pupils equal, round, reactive to light and accommodation III,IV, VI: ptosis not present, extra-ocular motions intact bilaterally V,VII: smile symmetric, facial light touch sensation normal bilaterally VIII: hearing normal bilaterally IX,X: gag reflex present XI: bilateral shoulder shrug XII: midline tongue extension Motor: Right : Upper extremity   5-/5    Left:     Upper extremity   5/5  Lower extremity   4/5     Lower extremity   5/5 Tone and bulk:normal tone throughout; no atrophy noted Sensory: Pinprick and light touch decreased in the right upper and lower extremities Deep Tendon Reflexes:  Symmetric throughout Plantars: Right: mute   Left: mute Cerebellar: Dysmetria with finger-to-nose and heel-to-shin testing in the right upper and lower extremities Gait: not tested due to safety concerns   Laboratory Studies:  Basic Metabolic Panel: Recent Labs  Lab 10/17/19 0928  NA 140  K 3.3*  CL 105  CO2 24  GLUCOSE 184*  BUN 16  CREATININE 1.12*  CALCIUM 9.4  MG 2.0    Liver Function Tests: Recent Labs  Lab 10/17/19 0928  AST 39  ALT 20  ALKPHOS 102  BILITOT 0.9  PROT 8.0  ALBUMIN 4.1   Recent Labs  Lab 10/17/19 0928  LIPASE 20   No results for input(s): AMMONIA in the last 168 hours.  CBC: Recent Labs  Lab 10/17/19 0928  WBC 7.6  NEUTROABS 5.6  HGB 14.5  HCT 42.3  MCV  101.9*  PLT 189    Cardiac Enzymes: No results for input(s): CKTOTAL, CKMB, CKMBINDEX, TROPONINI in the last 168 hours.  BNP: Invalid input(s): POCBNP  CBG: Recent Labs  Lab 10/17/19 1528 10/17/19 1710  GLUCAP 138* 102*    Microbiology: Results for orders placed or performed during the hospital encounter of 10/17/19  Respiratory Panel by RT PCR (Flu A&B, Covid) - Nasopharyngeal Swab     Status: None   Collection Time: 10/17/19  1:59 PM   Specimen: Nasopharyngeal Swab  Result Value Ref Range Status   SARS Coronavirus 2 by RT PCR NEGATIVE NEGATIVE Final    Comment: (NOTE) SARS-CoV-2 target nucleic acids are NOT DETECTED.  The SARS-CoV-2 RNA is generally detectable in upper respiratoy specimens during the acute phase of infection. The lowest concentration of SARS-CoV-2 viral copies this assay can detect is 131 copies/mL. A negative result does not preclude SARS-Cov-2 infection and should not be used as the sole basis for treatment or other patient management decisions. A negative result may occur with  improper specimen collection/handling, submission of specimen other than nasopharyngeal swab, presence of viral mutation(s) within the areas targeted by this assay, and inadequate number of viral copies (<131 copies/mL). A negative result must be combined with clinical observations, patient history, and epidemiological information. The expected result is Negative.  Fact Sheet for Patients:  PinkCheek.be  Fact Sheet for Healthcare Providers:  GravelBags.it  This test is no t yet approved or cleared by the [place] FDA and  has been authorized for detection and/or diagnosis of SARS-CoV-2 by FDA under an Emergency Use Authorization (EUA). This EUA will remain  in effect (meaning this test can be used) for the duration of the COVID-19 declaration under Section 564(b)(1) of the Act, 21 U.S.C. section  360bbb-3(b)(1), unless the authorization is terminated or revoked sooner.     Influenza A by PCR NEGATIVE NEGATIVE Final   Influenza B by PCR NEGATIVE NEGATIVE Final    Comment: (NOTE) The Xpert Xpress SARS-CoV-2/FLU/RSV assay is intended as an aid in  the diagnosis of influenza from Nasopharyngeal swab specimens and  should not be used as a sole basis for treatment. Nasal washings and  aspirates are unacceptable for Xpert Xpress SARS-CoV-2/FLU/RSV  testing.  Fact Sheet for Patients: PinkCheek.be  Fact Sheet for Healthcare Providers: GravelBags.it  This test is not yet approved or cleared by the [place] FDA and  has been authorized for detection and/or diagnosis of SARS-CoV-2 by  FDA under an Emergency Use Authorization (EUA). This EUA will remain  in effect (meaning this test can be used) for the duration of the  Covid-19 declaration  under Section 564(b)(1) of the Act, 21  U.S.C. section 360bbb-3(b)(1), unless the authorization is  terminated or revoked. Performed at [facility], [address] [place], [place] [address]     Coagulation Studies: Recent Labs    10/17/19 0928  LABPROT 11.6  INR 0.9    Urinalysis: No results for input(s): COLORURINE, LABSPEC, PHURINE, GLUCOSEU, HGBUR, BILIRUBINUR, KETONESUR, PROTEINUR, UROBILINOGEN, NITRITE, LEUKOCYTESUR in the last 168 hours.  Invalid input(s): APPERANCEUR  Lipid Panel:    Component Value Date/Time   CHOL 248 (H) 12/08/2018 1038   TRIG 150.0 (H) 12/08/2018 1038   HDL 46.40 12/08/2018 1038   CHOLHDL 5 12/08/2018 1038   VLDL 30.0 12/08/2018 1038   LDLCALC 171 (H) 12/08/2018 1038    HgbA1C:  Lab Results  Component Value Date   HGBA1C 7.5 (H) 12/08/2018    Urine Drug Screen:  No results found for: LABOPIA, COCAINSCRNUR, LABBENZ, AMPHETMU, THCU, LABBARB  Alcohol Level: No results for input(s): ETH in the last 168 hours.  Other  results: EKG: normal sinus rhythm with sinus arhythmia at 67 bpm  Imaging: DG Chest 2 View  Result Date: 10/17/2019 CLINICAL DATA:  Weakness, nausea and vomiting EXAM: CHEST - 2 VIEW COMPARISON:  01/16/2013 FINDINGS: The heart size and mediastinal contours are within normal limits. Bandlike opacity within the right lung base. Mild streaky left basilar opacity. No pleural effusion. No pneumothorax. Mild superior endplate compression fracture within the midthoracic spine, at approximately T8. There may also be mild height loss at the T10 and T11 levels. IMPRESSION: 1. Bandlike opacity within the right lung base, which may represent atelectasis or scarring. 2. Multiple superior endplate compression deformities within the mid to lower thoracic spine. Similar findings were reported within this location on radiographs dated 09/22/2019. The images from this exam were not immediately available for comparison at the time of dictation. Electronically Signed   By: Davina Poke D.O.   On: 10/17/2019 14:20   CT HEAD WO CONTRAST  Result Date: 10/17/2019 CLINICAL DATA:  Neuro deficit, acute, stroke suspected. Additional history provided: History of breast cancer, right-sided weakness which began a few days ago, arm and leg numbness, dizziness. EXAM: CT HEAD WITHOUT CONTRAST TECHNIQUE: Contiguous axial images were obtained from the base of the skull through the vertex without intravenous contrast. COMPARISON:  Report from brain MRI 12/01/1995 (images unavailable). FINDINGS: Brain: Moderate generalized cerebral atrophy. Mild multifocal ill-defined hypoattenuation within the cerebral white matter is nonspecific, but consistent with chronic small vessel ischemic disease. There are age-indeterminate lacunar infarcts within the internal capsules bilaterally (for instance as seen on series 3, images 12 and 13). There is no acute intracranial hemorrhage. No demarcated cortical infarct. No extra-axial fluid collection. No  evidence of intracranial mass. No midline shift. Vascular: No hyperdense vessel. Skull: Normal. Negative for fracture or focal lesion. Sinuses/Orbits: Visualized orbits show no acute finding. Minimal ethmoid sinus mucosal thickening. No significant mastoid effusion. IMPRESSION: No acute intracranial hemorrhage or acute demarcated cortical infarction. Age-indeterminate lacunar infarcts within the internal capsules bilaterally. Mild chronic small vessel ischemic disease. Moderate generalized cerebral atrophy. Electronically Signed   By: Kellie Simmering DO   On: 10/17/2019 10:28   MR BRAIN WO CONTRAST  Result Date: 10/17/2019 CLINICAL DATA:  Neuro deficit, acute, stroke suspected. Additional history provided: Right-sided weakness to arm and leg, numbness arm and leg, dizziness. EXAM: MRI HEAD WITHOUT CONTRAST TECHNIQUE: Multiplanar, multiecho pulse sequences of the brain and surrounding structures were obtained without intravenous contrast. COMPARISON:  Noncontrast head  CT performed earlier the same day 10/17/2019. Report from brain MRI 12/01/1995 (images unavailable). FINDINGS: Brain: Moderate generalized cerebral atrophy. There is a 10 mm focus of restricted diffusion within the left thalamus consistent with acute infarction (series 5, image 23). Multiple small T2 hyperintense foci within the bilateral basal ganglia and internal capsules appear to reflect prominent perivascular spaces. Background mild multifocal T2/FLAIR hyperintensity within the cerebral white matter which is nonspecific, but consistent with chronic small vessel ischemic disease. No evidence of intracranial mass. No chronic intracranial blood products. No extra-axial fluid collection. No midline shift. Vascular: Expected proximal arterial flow voids. Skull and upper cervical spine: No focal marrow lesion. Sinuses/Orbits: Visualized orbits show no acute finding. Mild ethmoid sinus mucosal thickening. No significant mastoid effusion. 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 Mild cerebral white matter chronic small vessel ischemic disease. Moderate generalized cerebral atrophy. Mild ethmoid sinus mucosal thickening. Electronically Signed   By: Kellie Simmering DO   On: 10/17/2019 14:49    Assessment: 79 y.o. female with a history of HTN, HLD and DM who presents with a 5-6 day history of right hemiparesis.  Patient reports going to bed either Wednesday or Thursday of last week and awakening the next day with right sided weakness and numbness.  With no improvement she was brought to the ED today.  Initial NIHSS of 5.   On ASA and statin prior to admission. MRI of the brain personally reviewed and reveals an acute left thalamic infarct.  Etiology likely small vessel disease.  Further work up recommended for risk factor modification.     Stroke Risk Factors - diabetes mellitus, hyperlipidemia and hypertension  Plan: 1. HgbA1c, fasting lipid panel.  Target A1c<7.0.  Target LDL<70.   2. PT consult, OT consult, Speech consult 3. Echocardiogram 4. Carotid dopplers 5. Prophylactic therapy-Dual antiplatelet therapy with ASA 81mg  and Plavix 75mg  for three weeks with change to Plavix 75mg  daily alone as monotherapy after that time. 6. NPO until RN stroke swallow screen 7. Telemetry monitoring 8. Frequent neuro checks   Alexis Goodell, MD Neurology  10/17/2019, 5:27 PM

## 2019-10-17 NOTE — H&P (Signed)
History and Physical    Joyce Maynard ZOX:[id number] DOB: 08/29/1940 DOA: 10/17/2019  Referring MD/NP/PA:   PCP: Einar Pheasant, MD   Patient coming from:  The patient is coming from home.  At baseline, pt is partially dependent for most of ADL.        Chief Complaint: Left-sided weakness and numbness  HPI: Joyce Maynard is a 79 y.o. female with medical history significant of hypertension, hyperlipidemia, diabetes mellitus, GERD, hypothyroidism, depression with anxiety, vertigo, breast cancer (s/p of her lumpectomy, chemo and radiation therapy), polio, CKD stage III, who presents with left-sided weakness and numbness.  Patient states that she has been having right-sided weakness and numbness in both arms and the legs for several days.  No slurred speech, vision loss or hearing loss. She also has dizziness.  Denies chest pain, shortness breath, cough, fever or chills. Patient has nausea and vomited several times with nonbilious nonbloody vomiting.  Patient had 1 diarrhea today.  No abdominal pain.  Denies symptoms of UTI. Patient had fall several weeks ago and complains of some back pain  ED Course: pt was found to have troponin level 9 --> 6, WBC 7.6, INR 0.9, PTT 27, pending COVID-19 PCR, renal function close to baseline, temperature normal, blood pressure 113/83, heart rate 58, RR 18, oxygen saturation 99% on room air.  CT head showed age indeterminate lacunar stroke.  MRI of brain showed 10 mm left thalamus stroke.   CXR showed 1. Bandlike opacity within the right lung base, which may represent atelectasis or scarring. 2. Multiple superior endplate compression deformities within the mid to lower thoracic spine. Similar findings were reported within this location on radiographs dated 09/22/2019. The images from this exam were not immediately available for comparison at the time of dictation.   Review of Systems:   General: no fevers, chills, no body weight gain, has fatigue HEENT: no  blurry vision, hearing changes or sore throat Respiratory: no dyspnea, coughing, wheezing CV: no chest pain, no palpitations GI: has nausea, vomiting, diarrhea, no constipation and abdominal pain,  GU: no dysuria, burning on urination, increased urinary frequency, hematuria  Ext: no leg edema Neuro: No vision change or hearing loss.  Has right-sided weakness and numbness. Skin: no rash, no skin tear. MSK: No muscle spasm, no deformity, no limitation of range of movement in spin Heme: No easy bruising.  Travel history: No recent long distant travel.  Allergy: No Known Allergies  Past Medical History:  Diagnosis Date  . Breast cancer ([place]) 2005   s/p chemotherapy and XRT  . Depression   . Diabetes ([place])   . GERD (gastroesophageal reflux disease)   . Hx of vertigo   . Hypercholesterolemia   . Hypertension   . Hypothyroidism   . Polio 1948   history  . PONV (postoperative nausea and vomiting)     Past Surgical History:  Procedure Laterality Date  . BREAST BIOPSY Left 2005  . BREAST BIOPSY Left 10/25/2015   US guided biopsy  . BREAST LUMPECTOMY Left 2005  . EXCISION OF BREAST LESION Left 11/19/2015   Procedure: EXCISION OF BREAST LESION/ MASS;  Surgeon: Leonie Green, MD;  Location: [facility] ORS;  Service: General;  Laterality: Left;  . EYE SURGERY     cataracts bilateral  . TUBAL LIGATION      Social History:  reports that she has never smoked. She has never used smokeless tobacco. She reports that she does not drink alcohol and does not use drugs.  Family History:  Family History  Problem Relation Age of Onset  . Cancer Mother        breast  . Breast cancer Mother 49  . Stroke Father   . Breast cancer Maternal Aunt      Prior to Admission medications   Medication Sig Start Date End Date Taking? Authorizing Provider  acetaminophen (TYLENOL) 500 MG tablet Take 1,000 mg by mouth every 6 (six) hours as needed for mild pain.    [provider]  aspirin EC  81 MG tablet Take 81 mg by mouth daily. Has stopped prior to procedure    [provider]  BD ULTRA-FINE LANCETS lancets Test blood glucose BID DX code 250.00 08/01/13   Einar Pheasant, MD  bismuth subsalicylate (PEPTO BISMOL) 262 MG chewable tablet Chew 524 mg by mouth as needed.    [provider]  cyanocobalamin (,VITAMIN B-12,) 1000 MCG/ML injection Inject 1 mL (1,000 mcg total) into the muscle every 30 (thirty) days. 02/16/19   Einar Pheasant, MD  EUTHYROX 175 MCG tablet Take 1 tablet by mouth once daily 08/28/19   Einar Pheasant, MD  glimepiride (AMARYL) 2 MG tablet Take 1 tablet (2 mg total) by mouth 2 (two) times daily. 12/16/17   Crecencio Mc, MD  glucose blood (ONE TOUCH ULTRA TEST) test strip Test blood sugar BID Dx Code:  250.00 Pt uses Contour Meter 08/01/13   Einar Pheasant, MD  LORazepam (ATIVAN) 1 MG tablet TAKE 1 TABLET BY MOUTH ONCE DAILY AS NEEDED FOR ANXIETY 08/29/19   Einar Pheasant, MD  metFORMIN (GLUCOPHAGE XR) 500 MG 24 hr tablet Two tablets bid 12/16/17   Crecencio Mc, MD  metoprolol succinate (TOPROL-XL) 25 MG 24 hr tablet TAKE 1 TABLET BY MOUTH TWICE DAILY. NEEDS TO KEEP NEXT APPOINTMENT FOR FURTHER REFILLS. 06/17/17   Einar Pheasant, MD  pantoprazole (PROTONIX) 40 MG tablet TAKE 1 TABLET BY MOUTH TWICE DAILY WITH A MEAL 08/28/19   Einar Pheasant, MD  rosuvastatin (CRESTOR) 20 MG tablet Take 1 tablet by mouth once daily 01/17/19   Einar Pheasant, MD  SYRINGE-NEEDLE, DISP, 3 ML (LUER LOCK SAFETY SYRINGES) 25G X 1" 3 ML MISC Use to inject B12 q 30 days 01/31/19   Einar Pheasant, MD  venlafaxine XR [facility]) 150 MG 24 hr capsule Take 1 capsule by mouth once daily 08/28/19   Einar Pheasant, MD    Physical Exam: Vitals:   10/17/19 1630 10/17/19 1700 10/17/19 1730 10/17/19 1800  BP: (!) 177/118 (!) 184/121 (!) 186/110 (!) 170/94  Pulse: (!) 54 (!) 51 (!) 51 (!) 54  Resp: 16 16  (!) 25  Temp:      TempSrc:      SpO2: 100% 100% 100% 100%    Weight:      Height:       General: Not in acute distress HEENT:       Eyes: PERRL, EOMI, no scleral icterus.       ENT: No discharge from the ears and nose, no pharynx injection, no tonsillar enlargement.        Neck: No JVD, no bruit, no mass felt. Heme: No neck lymph node enlargement. Cardiac: S1/S2, RRR, No murmurs, No gallops or rubs. Respiratory:  No rales, wheezing, rhonchi or rubs. GI: Soft, nondistended, nontender, no rebound pain, no organomegaly, BS present. GU: No hematuria Ext: No pitting leg edema bilaterally. 1+DP/PT pulse bilaterally. Musculoskeletal: No joint deformities, No joint redness or warmth, no limitation of ROM in spin.  Skin: No rashes.  Neuro: Alert, oriented X3, cranial nerves II-XII grossly intact. Muscle strength 4/5 in right leg and 5/5 in other extremities, sensation to light touch intact. Brachial reflex 2+ bilaterally.  Psych: Patient is not psychotic, no suicidal or hemocidal ideation.  Labs on Admission: I have personally reviewed following labs and imaging studies  CBC: Recent Labs  Lab 10/17/19 0928  WBC 7.6  NEUTROABS 5.6  HGB 14.5  HCT 42.3  MCV 101.9*  PLT 675   Basic Metabolic Panel: Recent Labs  Lab 10/17/19 0928  NA 140  K 3.3*  CL 105  CO2 24  GLUCOSE 184*  BUN 16  CREATININE 1.12*  CALCIUM 9.4  MG 2.0   GFR: Estimated Creatinine Clearance: 47.7 mL/min (A) (by C-G formula based on SCr of 1.12 mg/dL (H)). Liver Function Tests: Recent Labs  Lab 10/17/19 0928  AST 39  ALT 20  ALKPHOS 102  BILITOT 0.9  PROT 8.0  ALBUMIN 4.1   Recent Labs  Lab 10/17/19 0928  LIPASE 20   No results for input(s): AMMONIA in the last 168 hours. Coagulation Profile: Recent Labs  Lab 10/17/19 0928  INR 0.9   Cardiac Enzymes: No results for input(s): CKTOTAL, CKMB, CKMBINDEX, TROPONINI in the last 168 hours. BNP (last 3 results) No results for input(s): PROBNP in the last 8760 hours. HbA1C: No results for input(s): HGBA1C  in the last 72 hours. CBG: Recent Labs  Lab 10/17/19 1528 10/17/19 1710  GLUCAP 138* 102*   Lipid Profile: No results for input(s): CHOL, HDL, LDLCALC, TRIG, CHOLHDL, LDLDIRECT in the last 72 hours. Thyroid Function Tests: Recent Labs    10/17/19 0928  TSH 44.545*   Anemia Panel: No results for input(s): VITAMINB12, FOLATE, FERRITIN, TIBC, IRON, RETICCTPCT in the last 72 hours. Urine analysis:    Component Value Date/Time   COLORURINE YELLOW 09/23/2015 [place] 09/23/2015 1258   LABSPEC 1.025 09/23/2015 1258   PHURINE 5.5 09/23/2015 1258   GLUCOSEU 100 (A) 09/23/2015 1258   HGBUR TRACE-LYSED (A) 09/23/2015 1258   BILIRUBINUR NEGATIVE 09/23/2015 1258   KETONESUR NEGATIVE 09/23/2015 1258   PROTEINUR NEGATIVE 05/31/2015 1645   UROBILINOGEN 0.2 09/23/2015 1258   NITRITE NEGATIVE 09/23/2015 1258   LEUKOCYTESUR SMALL (A) 09/23/2015 1258   Sepsis Labs: @LABRCNTIP (procalcitonin:4,lacticidven:4) ) Recent Results (from the past 240 hour(s))  Respiratory Panel by RT PCR (Flu A&B, Covid) - Nasopharyngeal Swab     Status: None   Collection Time: 10/17/19  1:59 PM   Specimen: Nasopharyngeal Swab  Result Value Ref Range Status   SARS Coronavirus 2 by RT PCR NEGATIVE NEGATIVE Final    Comment: (NOTE) SARS-CoV-2 target nucleic acids are NOT DETECTED.  The SARS-CoV-2 RNA is generally detectable in upper respiratoy specimens during the acute phase of infection. The lowest concentration of SARS-CoV-2 viral copies this assay can detect is 131 copies/mL. A negative result does not preclude SARS-Cov-2 infection and should not be used as the sole basis for treatment or other patient management decisions. A negative result may occur with  improper specimen collection/handling, submission of specimen other than nasopharyngeal swab, presence of viral mutation(s) within the areas targeted by this assay, and inadequate number of viral copies (<131 copies/mL). A negative  result must be combined with clinical observations, patient history, and epidemiological information. The expected result is Negative.  Fact Sheet for Patients:  PinkCheek.be  Fact Sheet for Healthcare Providers:  GravelBags.it  This test is no t yet approved or cleared  by the [place] and  has been authorized for detection and/or diagnosis of SARS-CoV-2 by FDA under an Emergency Use Authorization (EUA). This EUA will remain  in effect (meaning this test can be used) for the duration of the COVID-19 declaration under Section 564(b)(1) of the Act, 21 U.S.C. section 360bbb-3(b)(1), unless the authorization is terminated or revoked sooner.     Influenza A by PCR NEGATIVE NEGATIVE Final   Influenza B by PCR NEGATIVE NEGATIVE Final    Comment: (NOTE) The Xpert Xpress SARS-CoV-2/FLU/RSV assay is intended as an aid in  the diagnosis of influenza from Nasopharyngeal swab specimens and  should not be used as a sole basis for treatment. Nasal washings and  aspirates are unacceptable for Xpert Xpress SARS-CoV-2/FLU/RSV  testing.  Fact Sheet for Patients: PinkCheek.be  Fact Sheet for Healthcare Providers: GravelBags.it  This test is not yet approved or cleared by the [place] FDA and  has been authorized for detection and/or diagnosis of SARS-CoV-2 by  FDA under an Emergency Use Authorization (EUA). This EUA will remain  in effect (meaning this test can be used) for the duration of the  Covid-19 declaration under Section 564(b)(1) of the Act, 21  U.S.C. section 360bbb-3(b)(1), unless the authorization is  terminated or revoked. Performed at [facility], [address] [place], [place] [address]      Radiological Exams on Admission: DG Chest 2 View  Result Date: 10/17/2019 CLINICAL DATA:  Weakness, nausea and vomiting EXAM: CHEST - 2 VIEW  COMPARISON:  01/16/2013 FINDINGS: The heart size and mediastinal contours are within normal limits. Bandlike opacity within the right lung base. Mild streaky left basilar opacity. No pleural effusion. No pneumothorax. Mild superior endplate compression fracture within the midthoracic spine, at approximately T8. There may also be mild height loss at the T10 and T11 levels. IMPRESSION: 1. Bandlike opacity within the right lung base, which may represent atelectasis or scarring. 2. Multiple superior endplate compression deformities within the mid to lower thoracic spine. Similar findings were reported within this location on radiographs dated 09/22/2019. The images from this exam were not immediately available for comparison at the time of dictation. Electronically Signed   By: Davina Poke D.O.   On: 10/17/2019 14:20   CT HEAD WO CONTRAST  Result Date: 10/17/2019 CLINICAL DATA:  Neuro deficit, acute, stroke suspected. Additional history provided: History of breast cancer, right-sided weakness which began a few days ago, arm and leg numbness, dizziness. EXAM: CT HEAD WITHOUT CONTRAST TECHNIQUE: Contiguous axial images were obtained from the base of the skull through the vertex without intravenous contrast. COMPARISON:  Report from brain MRI 12/01/1995 (images unavailable). FINDINGS: Brain: Moderate generalized cerebral atrophy. Mild multifocal ill-defined hypoattenuation within the cerebral white matter is nonspecific, but consistent with chronic small vessel ischemic disease. There are age-indeterminate lacunar infarcts within the internal capsules bilaterally (for instance as seen on series 3, images 12 and 13). There is no acute intracranial hemorrhage. No demarcated cortical infarct. No extra-axial fluid collection. No evidence of intracranial mass. No midline shift. Vascular: No hyperdense vessel. Skull: Normal. Negative for fracture or focal lesion. Sinuses/Orbits: Visualized orbits show no acute finding.  Minimal ethmoid sinus mucosal thickening. No significant mastoid effusion. IMPRESSION: No acute intracranial hemorrhage or acute demarcated cortical infarction. Age-indeterminate lacunar infarcts within the internal capsules bilaterally. Mild chronic small vessel ischemic disease. Moderate generalized cerebral atrophy. Electronically Signed   By: Kellie Simmering DO   On: 10/17/2019 10:28   MR BRAIN WO  CONTRAST  Result Date: 10/17/2019 CLINICAL DATA:  Neuro deficit, acute, stroke suspected. Additional history provided: Right-sided weakness to arm and leg, numbness arm and leg, dizziness. EXAM: MRI HEAD WITHOUT CONTRAST TECHNIQUE: Multiplanar, multiecho pulse sequences of the brain and surrounding structures were obtained without intravenous contrast. COMPARISON:  Noncontrast head CT performed earlier the same day 10/17/2019. Report from brain MRI 12/01/1995 (images unavailable). FINDINGS: Brain: Moderate generalized cerebral atrophy. There is a 10 mm focus of restricted diffusion within the left thalamus consistent with acute infarction (series 5, image 23). Multiple small T2 hyperintense foci within the bilateral basal ganglia and internal capsules appear to reflect prominent perivascular spaces. Background mild multifocal T2/FLAIR hyperintensity within the cerebral white matter which is nonspecific, but consistent with chronic small vessel ischemic disease. No evidence of intracranial mass. No chronic intracranial blood products. No extra-axial fluid collection. No midline shift. Vascular: Expected proximal arterial flow voids. Skull and upper cervical spine: No focal marrow lesion. Sinuses/Orbits: Visualized orbits show no acute finding. Mild ethmoid sinus mucosal thickening. No significant mastoid effusion. IMPRESSION: 10 mm acute infarct within the left thalamus. Mild cerebral white matter chronic small vessel ischemic disease. Moderate generalized cerebral atrophy. Mild ethmoid sinus mucosal thickening.  Electronically Signed   By: Kellie Simmering DO   On: 10/17/2019 14:49     EKG: I have personally reviewed.  Sinus rhythm, QTC 460, LAD, incomplete right bundle blockage  Assessment/Plan Principal Problem:   Stroke [facility]) Active Problems:   Essential hypertension, benign   Type II diabetes mellitus with renal manifestations (HCC)   Hypercholesterolemia   Hypothyroidism   GERD (gastroesophageal reflux disease)   Anxiety   Compression fracture of thoracic vertebra (HCC)   Nausea vomiting and diarrhea   Hypokalemia   Stroke [facility]): MRI of brain showed 10 mm left thalamus stroke.  Dr. Doy Mince of neurology is consulted.  -Placed on MedSurg bed for observation - will follow up Neurology's Recs.  - Check carotid dopplers  - continue Crestor - Continue Plavix and add ASA. Per Dr. Doy Mince, "Prophylactic therapy-Dual antiplatelet therapy with ASA 81mg  and Plavix 75mg  for three weeks with change to Plavix 75mg  daily alone as monotherapy after that time" - fasting lipid panel and HbA1c  - 2D transthoracic echocardiography  - swallowing screen. If fails, will get SLP - PT/OT consult  Essential hypertension, benign -IV hydralazine as needed -Metoprolol  Type II diabetes mellitus with renal manifestations [facility]): Recent A1c 7.5, poorly controlled.  Patient is taking Metformin and Amaryl -Sliding scale insulin  Hypercholesterolemia -Crestor  Hypothyroidism -Synthroid  GERD (gastroesophageal reflux disease) -Protonix as needed  Anxiety -As needed Ativan  Compression fracture of thoracic vertebra (HCC) -As needed Percocet and Tylenol -PT/OT  Nausea & vomiting and diarrhea -As needed Zofran -Check C. difficile PCR  Hypokalemia: Potassium 3.3 -Replete potassium -Check magnesium level    DVT ppx: SQ Lovenox Code Status: Full code Family Communication: Yes, patient's son-in-law at bed side Disposition Plan:  Anticipate discharge back to previous environment Consults called:   Dr. Doy Mince Admission status: Med-surg bed for obs  Status is: Observation  The patient remains OBS appropriate and will d/c before 2 midnights.  Dispo: The patient is from: Home              Anticipated d/c is to: Home              Anticipated d/c date is: 1 day              Patient currently  is not medically stable to d/c.             Date of Service 10/17/2019    Ivor Costa [facility]   If 7PM-7AM, please contact night-coverage [email or web address] 10/17/2019, 6:30 PM

## 2019-10-17 NOTE — ED Provider Notes (Signed)
[facility] Emergency Department Provider Note  ____________________________________________   First MD Initiated Contact with Patient 10/17/19 1314     (approximate)  I have reviewed the triage vital signs and the nursing notes.   HISTORY  Chief Complaint Weakness   HPI Joyce Maynard is a 79 y.o. female with a past medical history of breast cancer status post lumpectomy, DM, HTN, hypothyroidism, GERD, and depression who presents accompanied by her son-in-law for assessment of right arm weakness and numbness as well as right leg weakness and numbness that began between 3 and 4 days ago.  Patient also notes she fell 3 weeks ago and was seen in the ED and diagnosed with rib fracture and has had some chest pain and back pain since then but no subsequent falls.  She also endorses a couple days of nonbloody nonbilious vomiting.  She denies any vision changes, vertigo, shortness of breath, abdominal pain, back pain, diarrhea, dysuria, blood in her stool, blood in her urine, rash, or any weakness numbness or tingling in her left hemibody.  Denies EtOH or illicit drug use.  No prior similar episodes or history of stroke.  No clear alleviating aggravating factors.         Past Medical History:  Diagnosis Date   Breast cancer ([place]) 2005   s/p chemotherapy and XRT   Depression    Diabetes ([place])    GERD (gastroesophageal reflux disease)    Hx of vertigo    Hypercholesterolemia    Hypertension    Hypothyroidism    [place] 1948   history   PONV (postoperative nausea and vomiting)     Patient Active Problem List   Diagnosis Date Noted   Stroke ([place]) 10/17/2019   Anxiety 10/17/2019   Compression fracture of body of thoracic vertebra (HCC) 10/17/2019   Nausea & vomiting 10/17/2019   Hypokalemia 10/17/2019   Weakness    CKD (chronic kidney disease), stage IIIa 12/12/2018   Left breast lump 09/10/2015   Health care maintenance 10/05/2014    Mild depression ([place]) 04/16/2014   Other malaise and fatigue 09/20/2013   Leg pain 01/18/2013   Essential hypertension, benign 04/18/2012   Type II diabetes mellitus with renal manifestations ([place]) 04/18/2012   Hypercholesterolemia 04/18/2012   History of breast cancer 04/18/2012   Hypothyroidism 04/18/2012   GERD (gastroesophageal reflux disease) 04/18/2012    Past Surgical History:  Procedure Laterality Date   BREAST BIOPSY Left 2005   BREAST BIOPSY Left 10/25/2015   US guided biopsy   BREAST LUMPECTOMY Left 2005   EXCISION OF BREAST LESION Left 11/19/2015   Procedure: EXCISION OF BREAST LESION/ MASS;  Surgeon: Leonie Green, MD;  Location: [facility] ORS;  Service: General;  Laterality: Left;   EYE SURGERY     cataracts bilateral   TUBAL LIGATION      Prior to Admission medications   Medication Sig Start Date End Date Taking? Authorizing Provider  aspirin EC 81 MG tablet Take 81 mg by mouth daily. Has stopped prior to procedure   Yes [provider]  EUTHYROX 175 MCG tablet Take 1 tablet by mouth once daily 08/28/19  Yes Einar Pheasant, MD  glimepiride (AMARYL) 2 MG tablet Take 1 tablet (2 mg total) by mouth 2 (two) times daily. Patient taking differently: Take 2 mg by mouth daily with breakfast.  12/16/17  Yes Crecencio Mc, MD  metoprolol succinate (TOPROL-XL) 25 MG 24 hr tablet TAKE 1 TABLET BY MOUTH TWICE DAILY. NEEDS  TO KEEP NEXT APPOINTMENT FOR FURTHER REFILLS. Patient taking differently: Take 25 mg by mouth daily.  06/17/17  Yes Einar Pheasant, MD  acetaminophen (TYLENOL) 500 MG tablet Take 1,000 mg by mouth every 6 (six) hours as needed for mild pain.    [provider]  BD ULTRA-FINE LANCETS lancets Test blood glucose BID DX code 250.00 08/01/13   Einar Pheasant, MD  cyanocobalamin (,VITAMIN B-12,) 1000 MCG/ML injection Inject 1 mL (1,000 mcg total) into the muscle every 30 (thirty) days. Patient not taking: Reported on 10/17/2019  02/16/19   Einar Pheasant, MD  glucose blood (ONE TOUCH ULTRA TEST) test strip Test blood sugar BID Dx Code:  250.00 Pt uses Contour Meter 08/01/13   Einar Pheasant, MD  LORazepam (ATIVAN) 1 MG tablet TAKE 1 TABLET BY MOUTH ONCE DAILY AS NEEDED FOR ANXIETY 08/29/19   Einar Pheasant, MD  metFORMIN (GLUCOPHAGE XR) 500 MG 24 hr tablet Two tablets bid Patient not taking: Reported on 10/17/2019 12/16/17   Crecencio Mc, MD  pantoprazole (PROTONIX) 40 MG tablet TAKE 1 TABLET BY MOUTH TWICE DAILY WITH A MEAL Patient not taking: Reported on 10/17/2019 08/28/19   Einar Pheasant, MD  rosuvastatin (CRESTOR) 20 MG tablet Take 1 tablet by mouth once daily Patient not taking: Reported on 10/17/2019 01/17/19   Einar Pheasant, MD  SYRINGE-NEEDLE, DISP, 3 ML (LUER LOCK SAFETY SYRINGES) 25G X 1" 3 ML MISC Use to inject B12 q 30 days 01/31/19   Einar Pheasant, MD  venlafaxine XR (EFFEXOR-XR) 150 MG 24 hr capsule Take 1 capsule by mouth once daily Patient not taking: Reported on 10/17/2019 08/28/19   Einar Pheasant, MD    Allergies Patient has no known allergies.  Family History  Problem Relation Age of Onset   Cancer Mother        breast   Breast cancer Mother 29   Stroke Father    Breast cancer Maternal Aunt     Social History Social History   Tobacco Use   Smoking status: Never Smoker   Smokeless tobacco: Never Used  Substance Use Topics   Alcohol use: No    Alcohol/week: 0.0 standard drinks   Drug use: No    Review of Systems  Review of Systems  Constitutional: Negative for chills and fever.  HENT: Negative for sore throat.   Eyes: Negative for pain.  Respiratory: Negative for cough and stridor.   Cardiovascular: Positive for chest pain.  Gastrointestinal: Negative for vomiting.  Skin: Negative for rash.  Neurological: Positive for focal weakness. Negative for seizures, loss of consciousness and headaches.  Psychiatric/Behavioral: Negative for suicidal ideas.  All other  systems reviewed and are negative.     ____________________________________________   PHYSICAL EXAM:  VITAL SIGNS: ED Triage Vitals  Enc Vitals Group     BP 10/17/19 0924 (!) 144/84     Pulse Rate 10/17/19 0924 67     Resp 10/17/19 0924 18     Temp 10/17/19 0924 98 F (36.7 C)     Temp Source 10/17/19 1313 Oral     SpO2 10/17/19 0924 100 %     Weight 10/17/19 0925 205 lb (93 kg)     Height 10/17/19 0925 5\' 7"  (1.702 m)     Head Circumference --      Peak Flow --      Pain Score 10/17/19 0925 0     Pain Loc --      Pain Edu? --      Excl.  in [place]? --    Vitals:   10/17/19 0924 10/17/19 1313  BP: (!) 144/84 113/83  Pulse: 67 (!) 58  Resp: 18 18  Temp: 98 F (36.7 C) 98.3 F (36.8 C)  SpO2: 100% 99%   Physical Exam Vitals and nursing note reviewed.  Constitutional:      General: She is not in acute distress.    Appearance: She is well-developed.  HENT:     Head: Normocephalic and atraumatic.     Right Ear: External ear normal.     Left Ear: External ear normal.     Nose: Nose normal.     Mouth/Throat:     Mouth: Mucous membranes are moist.  Eyes:     Conjunctiva/sclera: Conjunctivae normal.  Cardiovascular:     Rate and Rhythm: Normal rate and regular rhythm.     Heart sounds: No murmur heard.   Pulmonary:     Effort: Pulmonary effort is normal. No respiratory distress.     Breath sounds: Normal breath sounds.  Abdominal:     Palpations: Abdomen is soft.     Tenderness: There is no abdominal tenderness.  Musculoskeletal:     Cervical back: Neck supple.  Skin:    General: Skin is warm and dry.     Capillary Refill: Capillary refill takes less than 2 seconds.  Neurological:     Mental Status: She is alert and oriented to person, place, and time.  Psychiatric:        Mood and Affect: Mood normal.     Patient has a right-sided pronator drift.  No finger dysmetria.  3/5 strength in the right upper extremity and right lower extremity compared to the full  strength in the left upper and left lower extremity.  Sensation is intact throughout all extremities although patient states it feels "different" in the right upper extremity and right lower extremity.  Cranial nerves II through XII grossly intact. ____________________________________________   LABS (all labs ordered are listed, but only abnormal results are displayed)  Labs Reviewed  CBC - Abnormal; Notable for the following components:      Result Value   MCV 101.9 (*)    MCH 34.9 (*)    All other components within normal limits  COMPREHENSIVE METABOLIC PANEL - Abnormal; Notable for the following components:   Potassium 3.3 (*)    Glucose, Bld 184 (*)    Creatinine, Ser 1.12 (*)    GFR, Estimated 47 (*)    All other components within normal limits  TSH - Abnormal; Notable for the following components:   TSH 44.545 (*)    All other components within normal limits  RESPIRATORY PANEL BY RT PCR (FLU A&B, COVID)  PROTIME-INR  APTT  DIFFERENTIAL  LIPASE, BLOOD  MAGNESIUM  URINALYSIS, COMPLETE (UACMP) WITH MICROSCOPIC  HEMOGLOBIN A1C  CBG MONITORING, ED  TROPONIN I (HIGH SENSITIVITY)  TROPONIN I (HIGH SENSITIVITY)   ____________________________________________  EKG  Sinus rhythm with a ventricular rate of 67, incomplete right bundle branch block, otherwise unremarkable intervals, no other clear evidence of acute ischemia or other significant underlying arrhythmia. ____________________________________________  RADIOLOGY   Official radiology report(s): DG Chest 2 View  Result Date: 10/17/2019 CLINICAL DATA:  Weakness, nausea and vomiting EXAM: CHEST - 2 VIEW COMPARISON:  01/16/2013 FINDINGS: The heart size and mediastinal contours are within normal limits. Bandlike opacity within the right lung base. Mild streaky left basilar opacity. No pleural effusion. No pneumothorax. Mild superior endplate compression fracture within the midthoracic spine,  at approximately T8. There may  also be mild height loss at the T10 and T11 levels. IMPRESSION: 1. Bandlike opacity within the right lung base, which may represent atelectasis or scarring. 2. Multiple superior endplate compression deformities within the mid to lower thoracic spine. Similar findings were reported within this location on radiographs dated 09/22/2019. The images from this exam were not immediately available for comparison at the time of dictation. Electronically Signed   By: Davina Poke D.O.   On: 10/17/2019 14:20   CT HEAD WO CONTRAST  Result Date: 10/17/2019 CLINICAL DATA:  Neuro deficit, acute, stroke suspected. Additional history provided: History of breast cancer, right-sided weakness which began a few days ago, arm and leg numbness, dizziness. EXAM: CT HEAD WITHOUT CONTRAST TECHNIQUE: Contiguous axial images were obtained from the base of the skull through the vertex without intravenous contrast. COMPARISON:  Report from brain MRI 12/01/1995 (images unavailable). FINDINGS: Brain: Moderate generalized cerebral atrophy. Mild multifocal ill-defined hypoattenuation within the cerebral white matter is nonspecific, but consistent with chronic small vessel ischemic disease. There are age-indeterminate lacunar infarcts within the internal capsules bilaterally (for instance as seen on series 3, images 12 and 13). There is no acute intracranial hemorrhage. No demarcated cortical infarct. No extra-axial fluid collection. No evidence of intracranial mass. No midline shift. Vascular: No hyperdense vessel. Skull: Normal. Negative for fracture or focal lesion. Sinuses/Orbits: Visualized orbits show no acute finding. Minimal ethmoid sinus mucosal thickening. No significant mastoid effusion. IMPRESSION: No acute intracranial hemorrhage or acute demarcated cortical infarction. Age-indeterminate lacunar infarcts within the internal capsules bilaterally. Mild chronic small vessel ischemic disease. Moderate generalized cerebral atrophy.  Electronically Signed   By: Kellie Simmering DO   On: 10/17/2019 10:28   MR BRAIN WO CONTRAST  Result Date: 10/17/2019 CLINICAL DATA:  Neuro deficit, acute, stroke suspected. Additional history provided: Right-sided weakness to arm and leg, numbness arm and leg, dizziness. EXAM: MRI HEAD WITHOUT CONTRAST TECHNIQUE: Multiplanar, multiecho pulse sequences of the brain and surrounding structures were obtained without intravenous contrast. COMPARISON:  Noncontrast head CT performed earlier the same day 10/17/2019. Report from brain MRI 12/01/1995 (images unavailable). FINDINGS: Brain: Moderate generalized cerebral atrophy. There is a 10 mm focus of restricted diffusion within the left thalamus consistent with acute infarction (series 5, image 23). Multiple small T2 hyperintense foci within the bilateral basal ganglia and internal capsules appear to reflect prominent perivascular spaces. Background mild multifocal T2/FLAIR hyperintensity within the cerebral white matter which is nonspecific, but consistent with chronic small vessel ischemic disease. No evidence of intracranial mass. No chronic intracranial blood products. No extra-axial fluid collection. No midline shift. Vascular: Expected proximal arterial flow voids. Skull and upper cervical spine: No focal marrow lesion. Sinuses/Orbits: Visualized orbits show no acute finding. Mild ethmoid sinus mucosal thickening. No significant mastoid effusion. IMPRESSION: 10 mm acute infarct within the left thalamus. Mild cerebral white matter chronic small vessel ischemic disease. Moderate generalized cerebral atrophy. Mild ethmoid sinus mucosal thickening. Electronically Signed   By: Kellie Simmering DO   On: 10/17/2019 14:49    ____________________________________________   PROCEDURES  Procedure(s) performed (including Critical Care):  .1-3 Lead EKG Interpretation Performed by: Lucrezia Starch, MD Authorized by: Lucrezia Starch, MD     Interpretation: abnormal      ECG rate assessment: bradycardic     Rhythm: sinus rhythm     Ectopy: none     Conduction: normal       ____________________________________________   INITIAL IMPRESSION / ASSESSMENT AND  PLAN / ED COURSE        Patient presents with [place] to history exam for assessment of 3 to 4 days of right upper extremity weakness and numbness as well as right lower extremity weakness and numbness and some nonbloody nonbilious emesis in the setting of some chest pain from recent fall and rib fracture.  Patient is afebrile with a heart rate of 58 otherwise stable vital signs on room air on arrival.  Exam as above remarkable for some right hemibody weakness and right-sided pronator drift.  Patient did have a CT head obtained in triage showed an age-indeterminate lacunar infarct in the internal capsule bilaterally with small vessel chronic disease.  No evidence of intracranial hemorrhage.  Certainly possible patient has some nausea and vomiting secondary to stroke although it is also possible that she has infectious gastroenteritis.    While There is no clear evidence of acute ischemia on EKG does have some nonspecific findings a given patient has some chest pain which she attributes to her rib fracture will also plan to obtain troponin to assess for any evidence of ACS.   MR brain obtained does show evidence of an acute stroke as noted above.  Vessel imaging ordered.  I will plan to admit to hospital service for further evaluation management.  No indication for code stroke as patient symptoms began greater than 24 hours ago.  ____________________________________________   FINAL CLINICAL IMPRESSION(S) / ED DIAGNOSES  Final diagnoses:  Cerebrovascular accident (CVA), unspecified mechanism ([place])  Weakness  Hypokalemia  Nausea and vomiting, intractability of vomiting not specified, unspecified vomiting type    Medications  sodium chloride flush (NS) 0.9 % injection 3 mL (has no administration in time  range)  potassium chloride SA (KLOR-CON) CR tablet 40 mEq (has no administration in time range)  ondansetron (ZOFRAN) injection 4 mg (has no administration in time range)  hydrALAZINE (APRESOLINE) injection 5 mg (has no administration in time range)  oxyCODONE-acetaminophen (PERCOCET/ROXICET) 5-325 MG per tablet 1 tablet (has no administration in time range)  insulin aspart (novoLOG) injection 0-9 Units (has no administration in time range)  insulin aspart (novoLOG) injection 0-5 Units (has no administration in time range)     ED Discharge Orders    None       Note:  This document was prepared using Dragon voice recognition software and may include unintentional dictation errors.   Lucrezia Starch, MD 10/17/19 [phone]

## 2019-10-18 ENCOUNTER — Observation Stay
Admit: 2019-10-18 | Discharge: 2019-10-18 | Disposition: A | Discharge status: Home / Self Care | Payer: Medicare HMO | Attending: Internal Medicine | Admitting: Internal Medicine

## 2019-10-18 ENCOUNTER — Observation Stay: Payer: Medicare HMO

## 2019-10-18 DIAGNOSIS — M4854XA Collapsed vertebra, not elsewhere classified, thoracic region, initial encounter for fracture: Secondary | ICD-10-CM | POA: Diagnosis present

## 2019-10-18 DIAGNOSIS — I639 Cerebral infarction, unspecified: Secondary | ICD-10-CM | POA: Diagnosis present

## 2019-10-18 DIAGNOSIS — E1165 Type 2 diabetes mellitus with hyperglycemia: Secondary | ICD-10-CM | POA: Diagnosis present

## 2019-10-18 DIAGNOSIS — E1136 Type 2 diabetes mellitus with diabetic cataract: Secondary | ICD-10-CM | POA: Diagnosis present

## 2019-10-18 DIAGNOSIS — Z8673 Personal history of transient ischemic attack (TIA), and cerebral infarction without residual deficits: Secondary | ICD-10-CM | POA: Diagnosis present

## 2019-10-18 DIAGNOSIS — F418 Other specified anxiety disorders: Secondary | ICD-10-CM | POA: Diagnosis present

## 2019-10-18 DIAGNOSIS — E7800 Pure hypercholesterolemia, unspecified: Secondary | ICD-10-CM | POA: Diagnosis present

## 2019-10-18 DIAGNOSIS — Z823 Family history of stroke: Secondary | ICD-10-CM | POA: Diagnosis not present

## 2019-10-18 DIAGNOSIS — I6381 Other cerebral infarction due to occlusion or stenosis of small artery: Secondary | ICD-10-CM | POA: Diagnosis present

## 2019-10-18 DIAGNOSIS — Z803 Family history of malignant neoplasm of breast: Secondary | ICD-10-CM | POA: Diagnosis not present

## 2019-10-18 DIAGNOSIS — E1122 Type 2 diabetes mellitus with diabetic chronic kidney disease: Secondary | ICD-10-CM | POA: Diagnosis present

## 2019-10-18 DIAGNOSIS — Z853 Personal history of malignant neoplasm of breast: Secondary | ICD-10-CM | POA: Diagnosis not present

## 2019-10-18 DIAGNOSIS — I10 Essential (primary) hypertension: Secondary | ICD-10-CM | POA: Diagnosis not present

## 2019-10-18 DIAGNOSIS — E039 Hypothyroidism, unspecified: Secondary | ICD-10-CM | POA: Diagnosis present

## 2019-10-18 DIAGNOSIS — R531 Weakness: Secondary | ICD-10-CM | POA: Diagnosis present

## 2019-10-18 DIAGNOSIS — G8191 Hemiplegia, unspecified affecting right dominant side: Secondary | ICD-10-CM | POA: Diagnosis present

## 2019-10-18 DIAGNOSIS — W19XXXA Unspecified fall, initial encounter: Secondary | ICD-10-CM | POA: Diagnosis present

## 2019-10-18 DIAGNOSIS — Z20822 Contact with and (suspected) exposure to covid-19: Secondary | ICD-10-CM | POA: Diagnosis present

## 2019-10-18 DIAGNOSIS — I129 Hypertensive chronic kidney disease with stage 1 through stage 4 chronic kidney disease, or unspecified chronic kidney disease: Secondary | ICD-10-CM | POA: Diagnosis present

## 2019-10-18 DIAGNOSIS — I6523 Occlusion and stenosis of bilateral carotid arteries: Secondary | ICD-10-CM | POA: Diagnosis present

## 2019-10-18 DIAGNOSIS — K219 Gastro-esophageal reflux disease without esophagitis: Secondary | ICD-10-CM | POA: Diagnosis present

## 2019-10-18 DIAGNOSIS — Z79899 Other long term (current) drug therapy: Secondary | ICD-10-CM | POA: Diagnosis not present

## 2019-10-18 DIAGNOSIS — E785 Hyperlipidemia, unspecified: Secondary | ICD-10-CM | POA: Diagnosis present

## 2019-10-18 DIAGNOSIS — Z7982 Long term (current) use of aspirin: Secondary | ICD-10-CM | POA: Diagnosis not present

## 2019-10-18 DIAGNOSIS — F32A Depression, unspecified: Secondary | ICD-10-CM | POA: Diagnosis present

## 2019-10-18 DIAGNOSIS — S2239XA Fracture of one rib, unspecified side, initial encounter for closed fracture: Secondary | ICD-10-CM | POA: Diagnosis present

## 2019-10-18 DIAGNOSIS — Z9221 Personal history of antineoplastic chemotherapy: Secondary | ICD-10-CM | POA: Diagnosis not present

## 2019-10-18 DIAGNOSIS — R197 Diarrhea, unspecified: Secondary | ICD-10-CM | POA: Diagnosis present

## 2019-10-18 DIAGNOSIS — N1831 Chronic kidney disease, stage 3a: Secondary | ICD-10-CM | POA: Diagnosis present

## 2019-10-18 LAB — URINALYSIS, COMPLETE (UACMP) WITH MICROSCOPIC
Bilirubin Urine: NEGATIVE
Glucose, UA: NEGATIVE mg/dL
Ketones, ur: NEGATIVE mg/dL
Nitrite: NEGATIVE
Protein, ur: NEGATIVE mg/dL
Specific Gravity, Urine: 1.024 (ref 1.005–1.030)
WBC, UA: >50 WBC/hpf — ABNORMAL HIGH (ref 0–5)
pH: 5 (ref 5.0–8.0)

## 2019-10-18 LAB — GLUCOSE, CAPILLARY
Glucose-Capillary: 158 mg/dL — ABNORMAL HIGH (ref 70–99)
Glucose-Capillary: 163 mg/dL — ABNORMAL HIGH (ref 70–99)
Glucose-Capillary: 172 mg/dL — ABNORMAL HIGH (ref 70–99)
Glucose-Capillary: 141 mg/dL — ABNORMAL HIGH (ref 70–99)

## 2019-10-18 LAB — LIPID PANEL
Cholesterol: 214 mg/dL — ABNORMAL HIGH (ref 0–200)
HDL: 39 mg/dL — ABNORMAL LOW (ref >40)
LDL Cholesterol: 133 mg/dL — ABNORMAL HIGH (ref 0–99)
Total CHOL/HDL Ratio: 5.5 RATIO
Triglycerides: 211 mg/dL — ABNORMAL HIGH (ref <150)
VLDL: 42 mg/dL — ABNORMAL HIGH (ref 0–40)

## 2019-10-18 MED ORDER — PERFLUTREN LIPID MICROSPHERE
1.0000 mL | INTRAVENOUS | Status: AC | PRN
  Administered 2019-10-18: 2 mL via INTRAVENOUS
  Filled 2019-10-18: qty 10

## 2019-10-18 MED ORDER — ROSUVASTATIN CALCIUM 10 MG PO TABS
40.0000 mg | ORAL_TABLET | Freq: Every day | ORAL | Status: DC
  Administered 2019-10-19 – 2019-10-23 (×5): 40 mg via ORAL
  Filled 2019-10-18 (×5): qty 4

## 2019-10-18 MED ORDER — ENOXAPARIN SODIUM 60 MG/0.6ML ~~LOC~~ SOLN
0.5000 mg/kg | SUBCUTANEOUS | Status: DC
  Administered 2019-10-18 – 2019-10-22 (×5): 47.5 mg via SUBCUTANEOUS
  Filled 2019-10-18 (×6): qty 0.6

## 2019-10-18 NOTE — Plan of Care (Signed)
  Problem: Education: Goal: Knowledge of disease or condition will improve Outcome: Progressing Goal: Knowledge of secondary prevention will improve Outcome: Progressing Goal: Knowledge of patient specific risk factors addressed and post discharge goals established will improve Outcome: Progressing   Problem: Coping: Goal: Will verbalize positive feelings about self Outcome: Progressing Goal: Will identify appropriate support needs Outcome: Progressing   Problem: Health Behavior/Discharge Planning: Goal: Ability to manage health-related needs will improve Outcome: Progressing   Problem: Self-Care: Goal: Ability to participate in self-care as condition permits will improve Outcome: Progressing Goal: Verbalization of feelings and concerns over difficulty with self-care will improve Outcome: Progressing Goal: Ability to communicate needs accurately will improve Outcome: Progressing   Problem: Nutrition: Goal: Risk of aspiration will decrease Outcome: Progressing

## 2019-10-18 NOTE — Consult Note (Signed)
Subjective: No new neurological complaints  Objective: Current vital signs: BP (!) 156/81 (BP Location: Left Arm)   Pulse (!) 59   Temp 98 F (36.7 C) (Oral)   Resp 19   Ht 5\' 7"  (1.702 m)   Wt 93 kg   SpO2 100%   BMI 32.11 kg/m  Vital signs in last 24 hours: Temp:  [97.8 F (36.6 C)-98.6 F (37 C)] 98 F (36.7 C) (10/13 0806) Pulse Rate:  [51-72] 59 (10/13 0806) Resp:  [16-25] 19 (10/13 0806) BP: (104-186)/(53-121) 156/81 (10/13 0806) SpO2:  [98 %-100 %] 100 % (10/13 0806)  Intake/Output from previous day: No intake/output data recorded. Intake/Output this shift: No intake/output data recorded. Nutritional status:  Diet Order            Diet Heart Room service appropriate? Yes; Fluid consistency: Thin  Diet effective now                 Neurologic Exam: Mental Status: Alert, oriented, thought content appropriate.  Speech fluent without evidence of aphasia.  Able to follow 3 step commands without difficulty. Cranial Nerves: II: Discs flat bilaterally; Visual fields grossly normal, pupils equal, round, reactive to light and accommodation III,IV, VI: ptosis not present, extra-ocular motions intact bilaterally V,VII: smile symmetric, facial light touch sensation normal bilaterally VIII: hearing normal bilaterally IX,X: gag reflex present XI: bilateral shoulder shrug XII: midline tongue extension Motor: Right :  Upper extremity   5-/5                                     Left:     Upper extremity   5/5             Lower extremity   4/5                                                  Lower extremity   5/5 Tone and bulk:normal tone throughout; no atrophy noted Sensory: Pinprick and light touch decreased in the right upper and lower extremities Deep Tendon Reflexes: Symmetric throughout Plantars: Right: mute                              Left: mute Cerebellar: Dysmetria with finger-to-nose and heel-to-shin testing in the right upper and lower extremities   Lab  Results: Basic Metabolic Panel: Recent Labs  Lab 10/17/19 0928  NA 140  K 3.3*  CL 105  CO2 24  GLUCOSE 184*  BUN 16  CREATININE 1.12*  CALCIUM 9.4  MG 2.0    Liver Function Tests: Recent Labs  Lab 10/17/19 0928  AST 39  ALT 20  ALKPHOS 102  BILITOT 0.9  PROT 8.0  ALBUMIN 4.1   Recent Labs  Lab 10/17/19 0928  LIPASE 20   No results for input(s): AMMONIA in the last 168 hours.  CBC: Recent Labs  Lab 10/17/19 0928  WBC 7.6  NEUTROABS 5.6  HGB 14.5  HCT 42.3  MCV 101.9*  PLT 189    Cardiac Enzymes: No results for input(s): CKTOTAL, CKMB, CKMBINDEX, TROPONINI in the last 168 hours.  Lipid Panel: Recent Labs  Lab 10/18/19 0416  CHOL 214*  TRIG 211*  HDL 39*  CHOLHDL  5.5  VLDL 42*  LDLCALC 133*    CBG: Recent Labs  Lab 10/17/19 1528 10/17/19 1710 10/17/19 2052 10/18/19 0808  GLUCAP 138* 102* 124* 158*    Microbiology: Results for orders placed or performed during the hospital encounter of 10/17/19  Respiratory Panel by RT PCR (Flu A&B, Covid) - Nasopharyngeal Swab     Status: None   Collection Time: 10/17/19  1:59 PM   Specimen: Nasopharyngeal Swab  Result Value Ref Range Status   SARS Coronavirus 2 by RT PCR NEGATIVE NEGATIVE Final    Comment: (NOTE) SARS-CoV-2 target nucleic acids are NOT DETECTED.  The SARS-CoV-2 RNA is generally detectable in upper respiratoy specimens during the acute phase of infection. The lowest concentration of SARS-CoV-2 viral copies this assay can detect is 131 copies/mL. A negative result does not preclude SARS-Cov-2 infection and should not be used as the sole basis for treatment or other patient management decisions. A negative result may occur with  improper specimen collection/handling, submission of specimen other than nasopharyngeal swab, presence of viral mutation(s) within the areas targeted by this assay, and inadequate number of viral copies (<131 copies/mL). A negative result must be combined  with clinical observations, patient history, and epidemiological information. The expected result is Negative.  Fact Sheet for Patients:  PinkCheek.be  Fact Sheet for Healthcare Providers:  GravelBags.it  This test is no t yet approved or cleared by the [place] FDA and  has been authorized for detection and/or diagnosis of SARS-CoV-2 by FDA under an Emergency Use Authorization (EUA). This EUA will remain  in effect (meaning this test can be used) for the duration of the COVID-19 declaration under Section 564(b)(1) of the Act, 21 U.S.C. section 360bbb-3(b)(1), unless the authorization is terminated or revoked sooner.     Influenza A by PCR NEGATIVE NEGATIVE Final   Influenza B by PCR NEGATIVE NEGATIVE Final    Comment: (NOTE) The Xpert Xpress SARS-CoV-2/FLU/RSV assay is intended as an aid in  the diagnosis of influenza from Nasopharyngeal swab specimens and  should not be used as a sole basis for treatment. Nasal washings and  aspirates are unacceptable for Xpert Xpress SARS-CoV-2/FLU/RSV  testing.  Fact Sheet for Patients: PinkCheek.be  Fact Sheet for Healthcare Providers: GravelBags.it  This test is not yet approved or cleared by the [place] FDA and  has been authorized for detection and/or diagnosis of SARS-CoV-2 by  FDA under an Emergency Use Authorization (EUA). This EUA will remain  in effect (meaning this test can be used) for the duration of the  Covid-19 declaration under Section 564(b)(1) of the Act, 21  U.S.C. section 360bbb-3(b)(1), unless the authorization is  terminated or revoked. Performed at [facility], [address] [place], [place] [address]     Coagulation Studies: Recent Labs    10/17/19 3382  LABPROT 11.6  INR 0.9    Imaging: DG Chest 2 View  Result Date: 10/17/2019 CLINICAL DATA:  Weakness, nausea  and vomiting EXAM: CHEST - 2 VIEW COMPARISON:  01/16/2013 FINDINGS: The heart size and mediastinal contours are within normal limits. Bandlike opacity within the right lung base. Mild streaky left basilar opacity. No pleural effusion. No pneumothorax. Mild superior endplate compression fracture within the midthoracic spine, at approximately T8. There may also be mild height loss at the T10 and T11 levels. IMPRESSION: 1. Bandlike opacity within the right lung base, which may represent atelectasis or scarring. 2. Multiple superior endplate compression deformities within the mid to lower thoracic spine.  Similar findings were reported within this location on radiographs dated 09/22/2019. The images from this exam were not immediately available for comparison at the time of dictation. Electronically Signed   By: Davina Poke D.O.   On: 10/17/2019 14:20   CT HEAD WO CONTRAST  Result Date: 10/17/2019 CLINICAL DATA:  Neuro deficit, acute, stroke suspected. Additional history provided: History of breast cancer, right-sided weakness which began a few days ago, arm and leg numbness, dizziness. EXAM: CT HEAD WITHOUT CONTRAST TECHNIQUE: Contiguous axial images were obtained from the base of the skull through the vertex without intravenous contrast. COMPARISON:  Report from brain MRI 12/01/1995 (images unavailable). FINDINGS: Brain: Moderate generalized cerebral atrophy. Mild multifocal ill-defined hypoattenuation within the cerebral white matter is nonspecific, but consistent with chronic small vessel ischemic disease. There are age-indeterminate lacunar infarcts within the internal capsules bilaterally (for instance as seen on series 3, images 12 and 13). There is no acute intracranial hemorrhage. No demarcated cortical infarct. No extra-axial fluid collection. No evidence of intracranial mass. No midline shift. Vascular: No hyperdense vessel. Skull: Normal. Negative for fracture or focal lesion. Sinuses/Orbits:  Visualized orbits show no acute finding. Minimal ethmoid sinus mucosal thickening. No significant mastoid effusion. IMPRESSION: No acute intracranial hemorrhage or acute demarcated cortical infarction. Age-indeterminate lacunar infarcts within the internal capsules bilaterally. Mild chronic small vessel ischemic disease. Moderate generalized cerebral atrophy. Electronically Signed   By: Kellie Simmering DO   On: 10/17/2019 10:28   MR BRAIN WO CONTRAST  Result Date: 10/17/2019 CLINICAL DATA:  Neuro deficit, acute, stroke suspected. Additional history provided: Right-sided weakness to arm and leg, numbness arm and leg, dizziness. EXAM: MRI HEAD WITHOUT CONTRAST TECHNIQUE: Multiplanar, multiecho pulse sequences of the brain and surrounding structures were obtained without intravenous contrast. COMPARISON:  Noncontrast head CT performed earlier the same day 10/17/2019. Report from brain MRI 12/01/1995 (images unavailable). FINDINGS: Brain: Moderate generalized cerebral atrophy. There is a 10 mm focus of restricted diffusion within the left thalamus consistent with acute infarction (series 5, image 23). Multiple small T2 hyperintense foci within the bilateral basal ganglia and internal capsules appear to reflect prominent perivascular spaces. Background mild multifocal T2/FLAIR hyperintensity within the cerebral white matter which is nonspecific, but consistent with chronic small vessel ischemic disease. No evidence of intracranial mass. No chronic intracranial blood products. No extra-axial fluid collection. No midline shift. Vascular: Expected proximal arterial flow voids. Skull and upper cervical spine: No focal marrow lesion. Sinuses/Orbits: Visualized orbits show no acute finding. Mild ethmoid sinus mucosal thickening. No significant mastoid effusion. IMPRESSION: 10 mm acute infarct within the left thalamus. Mild cerebral white matter chronic small vessel ischemic disease. Moderate generalized cerebral atrophy.  Mild ethmoid sinus mucosal thickening. Electronically Signed   By: Kellie Simmering DO   On: 10/17/2019 14:49    Medications:  I have reviewed the patient's current medications. Scheduled: . aspirin EC  81 mg Oral Daily  . clopidogrel  75 mg Oral Daily  . enoxaparin (LOVENOX) injection  40 mg Subcutaneous Q24H  . insulin aspart  0-5 Units Subcutaneous QHS  . insulin aspart  0-9 Units Subcutaneous TID WC  . levothyroxine  175 mcg Oral Daily  . metoprolol succinate  25 mg Oral Daily  . rosuvastatin  20 mg Oral Daily    Assessment/Plan: 79 y.o. female with a history of HTN, HLD and DM who presents with a 5-6 day history of right hemiparesis.  Patient reports going to bed either Wednesday or Thursday of last week and  awakening the next day with right sided weakness and numbness.  With no improvement she was brought to the ED today.  Initial NIHSS of 5.   On ASA and statin prior to admission. MRI of the brain personally reviewed and reveals an acute left thalamic infarct.  Etiology likely small vessel disease.  Carotid dopplers pending.  Echocardiogram pending.  A1c 7.3, LDL 133.  Recommendations: 1. Blood sugar management with target A1c<7.0.   2. Lipid management with target LDL<70.   3. PT consult, OT consult, Speech therapy 4. Echocardiogram pending 5. Carotid dopplers pending 6. Prophylactic therapy-Dual antiplatelet therapy with ASA 81mg  and Plavix 75mg  for three weeks with change to Plavix 75mg  daily alone as monotherapy after that time. 7. Telemetry monitoring 8. Frequent neuro checks 9. BP management with target BP<140/80    LOS: 0 days   Alexis Goodell, MD Neurology  10/18/2019  10:14 AM

## 2019-10-18 NOTE — Evaluation (Signed)
Physical Therapy Evaluation Patient Details Name: Joyce Maynard MRN: [id number] DOB: 1940/02/03 Today's Date: 10/18/2019   History of Present Illness  Pt is a 79 y/o F who presented to hospital on 10/17/2019 with chief complaint of R side weakness & numbness & vomiting for several days. 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 Pt also seein in ED 3 weeks ago after fall & sustaining rib fx with chest & back pain. Pt with PMH significant for HTN, HLD, DM, depression with anxiety, vertigo, breast CA (s/p lumpectomy with chemo & radiation), polio, CKD stage 3.    Clinical Impression  Pt currently requires min assist level of care for transfers & very short distance gait to recliner. Pt is limited by posterior rib pain & fatigue with mobility. Pt also experiences RLE buckling with standing/gait but is able to recover with min assist. Pt would benefit from continued skilled PT treatment to focus on increasing independence with functional mobility, to focus on standing balance, and R NMR. Anticipate pt to make good progress.    Follow Up Recommendations Supervision/Assistance - 24 hour;SNF    Equipment Recommendations   (TBD in next venue)    Recommendations for Other Services       Precautions / Restrictions Precautions Precautions: Fall Precaution Comments: R hemi Restrictions Weight Bearing Restrictions: No      Mobility  Bed Mobility Overal bed mobility: Needs Assistance Bed Mobility: Supine to Sit     Supine to sit: Min guard (cuing for rolling to R side, sidelying>sitting EOB with extra time allowed)     General bed mobility comments: educated pt on hugging pillow to see if it alleviated rib pain but pt with minimal use of pillow  Transfers Overall transfer level: Needs assistance Equipment used: Rolling walker (2 wheeled) Transfers: Sit to/from Stand Sit to Stand: Min assist         General transfer comment: educationg & cuing re: safe hand placement on stable surface for  sit<>stand transfers  Ambulation/Gait Ambulation/Gait assistance: Min assist Gait Distance (Feet): 3 Feet Assistive device: Rolling walker (2 wheeled) Gait Pattern/deviations: Decreased stride length;Decreased stance time - right;Decreased step length - left;Decreased step length - right Gait velocity: decreased   General Gait Details: intermittent R knee buckling during short distance gait to recliner  Stairs            Wheelchair Mobility        Balance Overall balance assessment: Needs assistance;Mild deficits observed, not formally tested Sitting-balance support: Bilateral upper extremity supported Sitting balance-Leahy Scale: Fair     Standing balance support: Bilateral upper extremity supported Standing balance-Leahy Scale: Poor Standing balance comment: posterior LOB in standing with RW                             Pertinent Vitals/Pain Pain Assessment: 0-10 Pain Score: 7  Pain Location: R posterior rib/back Pain Descriptors / Indicators: Aching;Radiating Pain Intervention(s): Limited activity within patient's tolerance;Monitored during session (pt reports she's premedicated)    Home Living Family/patient expects to be discharged to:: Private residence Living Arrangements: Spouse/significant other (pt is caregiver for husband with dementia, pt's daughter reports pt's children can provide assist at d/c) Available Help at Discharge: Family;Available 24 hours/day Type of Home: House Home Access: Stairs to enter Entrance Stairs-Rails: None Entrance Stairs-Number of Steps: single 3" threshold step Home Layout: One level Home Equipment: Walker - 2 wheels      Prior Function Level of  Independence: Independent with assistive device(s) (mod I with RW up until a few weeks ago, pt now limited by posterior rib pain)               Hand Dominance   Dominant Hand: Right    Extremity/Trunk Assessment   Upper Extremity Assessment Upper Extremity  Assessment: Generalized weakness;RUE deficits/detail RUE Coordination: decreased fine motor (decreased smoothness & accuracy of RUE finger to nose compared to LUE)    Lower Extremity Assessment Lower Extremity Assessment: Generalized weakness;RLE deficits/detail RLE Deficits / Details: 3/5 RLE strength (R knee buckling at times in standing, short distance gait (~3 ft) bed>recliner) RLE Sensation: decreased light touch (pt with c/o impaired/decreased sensation inferior to R knee) RLE Coordination: decreased gross motor (decreased accuracy & coordination RLE with heel-shin test)    Cervical / Trunk Assessment Cervical / Trunk Assessment:  (rounded shoulders)  Communication      Cognition Arousal/Alertness: Awake/alert Behavior During Therapy: WFL for tasks assessed/performed Overall Cognitive Status: Within Functional Limits for tasks assessed                                        General Comments General comments (skin integrity, edema, etc.): pt quickly fatigues with mobility, limited by rib pain    Exercises     Assessment/Plan    PT Assessment Patient needs continued PT services  PT Problem List Decreased strength;Decreased mobility;Decreased safety awareness;Decreased knowledge of precautions;Decreased coordination;Decreased range of motion;Decreased activity tolerance;Cardiopulmonary status limiting activity;Pain;Impaired sensation;Decreased knowledge of use of DME;Decreased balance       PT Treatment Interventions DME instruction;Therapeutic exercise;Gait training;Balance training;Manual techniques;Stair training;Neuromuscular re-education;Modalities;Functional mobility training;Cognitive remediation;Therapeutic activities;Patient/family education    PT Goals (Current goals can be found in the Care Plan section)  Acute Rehab PT Goals Patient Stated Goal: none stated PT Goal Formulation: With patient Time For Goal Achievement: 11/01/19 Potential to  Achieve Goals: Good    Frequency 7X/week   Barriers to discharge Decreased caregiver support;Inaccessible home environment pt is caregiver for husband with dementia who cannot physicall assist her    Co-evaluation               AM-PAC PT "6 Clicks" Mobility  Outcome Measure Help needed turning from your back to your side while in a flat bed without using bedrails?: A Little Help needed moving from lying on your back to sitting on the side of a flat bed without using bedrails?: A Little Help needed moving to and from a bed to a chair (including a wheelchair)?: A Little Help needed standing up from a chair using your arms (e.g., wheelchair or bedside chair)?: A Little Help needed to walk in hospital room?: A Lot Help needed climbing 3-5 steps with a railing? : A Lot 6 Click Score: 16    End of Session Equipment Utilized During Treatment: Gait belt Activity Tolerance: Patient tolerated treatment well;Patient limited by pain;Patient limited by fatigue Patient left: in chair (in handoff to OT) Nurse Communication: Mobility status PT Visit Diagnosis: Unsteadiness on feet (R26.81);Other abnormalities of gait and mobility (R26.89);Muscle weakness (generalized) (M62.81);Difficulty in walking, not elsewhere classified (R26.2);Other symptoms and signs involving the nervous system (R29.898);Hemiplegia and hemiparesis Hemiplegia - Right/Left: Right Hemiplegia - dominant/non-dominant: Dominant Hemiplegia - caused by: Cerebral infarction    Time: 1322-1350 PT Time Calculation (min) (ACUTE ONLY): 28 min   Charges:   PT Evaluation $PT Eval Moderate Complexity: 1  Mod PT Treatments $Therapeutic Activity: 8-22 mins        Lavone Nian, PT, DPT 10/18/19, 3:01 PM

## 2019-10-18 NOTE — Progress Notes (Signed)
*  PRELIMINARY RESULTS* Echocardiogram 2D Echocardiogram has been performed.  Joyce Maynard 10/18/2019, 9:06 AM

## 2019-10-18 NOTE — Progress Notes (Addendum)
PROGRESS NOTE  Joyce Maynard DZH:[id number] DOB: 12-Mar-1940 DOA: 10/17/2019 PCP: Einar Pheasant, MD  HPI/Recap of past 24 hours:  Joyce Maynard is a 80 y.o. female with medical history significant of hypertension, hyperlipidemia, diabetes mellitus, GERD, hypothyroidism, depression with anxiety, vertigo, breast cancer (s/p of her lumpectomy, chemo and radiation therapy), polio, CKD stage III, who presents with left-sided weakness and numbness.  Patient states that she has been having right-sided weakness and numbness in both arms and the legs for several days.  No slurred speech, vision loss or hearing loss. She also has dizziness.  Denies chest pain, shortness breath, cough, fever or chills. Patient has nausea and vomited several times with nonbilious nonbloody vomiting.  Patient had 1 diarrhea today.  No abdominal pain.  Denies symptoms of UTI. Patient had fall several weeks ago and complains of some back pain  ED Course: pt was found to have troponin level 9 --> 6, WBC 7.6, INR 0.9, PTT 27, pending COVID-19 PCR, renal function close to baseline, temperature normal, blood pressure 113/83, heart rate 58, RR 18, oxygen saturation 99% on room air.  CT head showed age indeterminate lacunar stroke.  MRI of brain showed 10 mm left thalamus stroke.   Admitted for stroke work up.  Neurology/stroke team following.  10/18/19:  RLE weakness noted on exam with mild R hand dysmetria with finger to nose.  Assessment/Plan: Principal Problem:   Stroke [facility]) Active Problems:   Essential hypertension, benign   Type II diabetes mellitus with renal manifestations (HCC)   Hypercholesterolemia   Hypothyroidism   GERD (gastroesophageal reflux disease)   Anxiety   Compression fracture of thoracic vertebra (HCC)   Nausea vomiting and diarrhea   Hypokalemia  Acute CVA involving Left Thalamus MRI brain showed acute 10 mm left thalamus CVA Neuro stroke team following Admitted for stroke work up LDL 133  goal less that 70 A1C 7.3 goal <7.0 B/L carotid doppler US and 2D echo pending, follow results Monitor on telemetry ASA 81 and plavix 75 mg daily per neuro recs Crestor 20 mg daily at home, dose increased to 40 mg daily PT recommends SNF-TOC consulted for SNF placement OT eval pending Passed speech swallow eval  HLD LDL not at goal Management per above  DM2 with hyperglycemia A1C not at goal Management per above   Essential HTN Allow for permissive HTN Hold off home Toprol Xl  Chronic anxiety/depression Resume home regimen  Hypothyroidism Get TSH Resume levothyroxine    Code Status: Full   Family Communication: Updated her son via phone  Disposition Plan: Home or SNF after stroke workup is completed   Consultants:  Neurology  Procedures:  TTE   Antimicrobials:  None   DVT prophylaxis:  SQ Lovenox daily  Status is: Inpatient    Dispo: The patient is from:Home              Anticipated d/c is to:SNF              Anticipated d/c date is: 10/19/19, pending SNF placement               Patient currently not stable for dc, ongoing stroke work up        Objective: Vitals:   10/17/19 2334 10/18/19 0156 10/18/19 0301 10/18/19 0442  BP: 108/64 127/85 (!) 116/53 135/72  Pulse: (!) 58 67 (!) 57 (!) 56  Resp: 18 16 16 18   Temp: 97.9 F (36.6 C) 97.9 F (36.6 C) 98.6 F (37  C) 98.2 F (36.8 C)  TempSrc: Oral Oral Oral Oral  SpO2: 99% 100% 98% 100%  Weight:      Height:        Intake/Output Summary (Last 24 hours) at 10/18/2019 0747 Last data filed at 10/17/2019 1905 Gross per 24 hour  Intake 0 ml  Output --  Net 0 ml   Filed Weights   10/17/19 0925  Weight: 93 kg    Exam:  . General: 79 y.o. year-old female well developed well nourished in no acute distress.  Alert and oriented x3. . Cardiovascular: Regular rate and rhythm with no rubs or gallops.  No thyromegaly or JVD noted.   Marland Kitchen Respiratory: Clear to auscultation with no wheezes  or rales. Good inspiratory effort. . Abdomen: Soft nontender nondistended with normal bowel sounds x4 quadrants. . Musculoskeletal: No lower extremity edema. 2/4 pulses in all 4 extremities. RLE 4/5 strength, mild R hand dysmetria with finger to nose. Marland Kitchen Psychiatry: Mood is appropriate for condition and setting   Data Reviewed: CBC: Recent Labs  Lab 10/17/19 0928  WBC 7.6  NEUTROABS 5.6  HGB 14.5  HCT 42.3  MCV 101.9*  PLT 696   Basic Metabolic Panel: Recent Labs  Lab 10/17/19 0928  NA 140  K 3.3*  CL 105  CO2 24  GLUCOSE 184*  BUN 16  CREATININE 1.12*  CALCIUM 9.4  MG 2.0   GFR: Estimated Creatinine Clearance: 47.7 mL/min (A) (by C-G formula based on SCr of 1.12 mg/dL (H)). Liver Function Tests: Recent Labs  Lab 10/17/19 0928  AST 39  ALT 20  ALKPHOS 102  BILITOT 0.9  PROT 8.0  ALBUMIN 4.1   Recent Labs  Lab 10/17/19 0928  LIPASE 20   No results for input(s): AMMONIA in the last 168 hours. Coagulation Profile: Recent Labs  Lab 10/17/19 0928  INR 0.9   Cardiac Enzymes: No results for input(s): CKTOTAL, CKMB, CKMBINDEX, TROPONINI in the last 168 hours. BNP (last 3 results) No results for input(s): PROBNP in the last 8760 hours. HbA1C: Recent Labs    10/17/19 0928  HGBA1C 7.3*   CBG: Recent Labs  Lab 10/17/19 1528 10/17/19 1710 10/17/19 2052  GLUCAP 138* 102* 124*   Lipid Profile: Recent Labs    10/18/19 0416  CHOL 214*  HDL 39*  LDLCALC 133*  TRIG 211*  CHOLHDL 5.5   Thyroid Function Tests: Recent Labs    10/17/19 0928  TSH 44.545*   Anemia Panel: No results for input(s): VITAMINB12, FOLATE, FERRITIN, TIBC, IRON, RETICCTPCT in the last 72 hours. Urine analysis:    Component Value Date/Time   COLORURINE YELLOW (A) 10/18/2019 0238   APPEARANCEUR CLOUDY (A) 10/18/2019 0238   LABSPEC 1.024 10/18/2019 0238   PHURINE 5.0 10/18/2019 0238   GLUCOSEU NEGATIVE 10/18/2019 0238   GLUCOSEU 100 (A) 09/23/2015 1258   HGBUR SMALL (A)  10/18/2019 0238   BILIRUBINUR NEGATIVE 10/18/2019 0238   KETONESUR NEGATIVE 10/18/2019 0238   PROTEINUR NEGATIVE 10/18/2019 0238   UROBILINOGEN 0.2 09/23/2015 1258   NITRITE NEGATIVE 10/18/2019 0238   LEUKOCYTESUR LARGE (A) 10/18/2019 0238   Sepsis Labs: @LABRCNTIP (procalcitonin:4,lacticidven:4)  ) Recent Results (from the past 240 hour(s))  Respiratory Panel by RT PCR (Flu A&B, Covid) - Nasopharyngeal Swab     Status: None   Collection Time: 10/17/19  1:59 PM   Specimen: Nasopharyngeal Swab  Result Value Ref Range Status   SARS Coronavirus 2 by RT PCR NEGATIVE NEGATIVE Final    Comment: (NOTE) SARS-CoV-2  target nucleic acids are NOT DETECTED.  The SARS-CoV-2 RNA is generally detectable in upper respiratoy specimens during the acute phase of infection. The lowest concentration of SARS-CoV-2 viral copies this assay can detect is 131 copies/mL. A negative result does not preclude SARS-Cov-2 infection and should not be used as the sole basis for treatment or other patient management decisions. A negative result may occur with  improper specimen collection/handling, submission of specimen other than nasopharyngeal swab, presence of viral mutation(s) within the areas targeted by this assay, and inadequate number of viral copies (<131 copies/mL). A negative result must be combined with clinical observations, patient history, and epidemiological information. The expected result is Negative.  Fact Sheet for Patients:  PinkCheek.be  Fact Sheet for Healthcare Providers:  GravelBags.it  This test is no t yet approved or cleared by the [place] FDA and  has been authorized for detection and/or diagnosis of SARS-CoV-2 by FDA under an Emergency Use Authorization (EUA). This EUA will remain  in effect (meaning this test can be used) for the duration of the COVID-19 declaration under Section 564(b)(1) of the Act, 21  U.S.C. section 360bbb-3(b)(1), unless the authorization is terminated or revoked sooner.     Influenza A by PCR NEGATIVE NEGATIVE Final   Influenza B by PCR NEGATIVE NEGATIVE Final    Comment: (NOTE) The Xpert Xpress SARS-CoV-2/FLU/RSV assay is intended as an aid in  the diagnosis of influenza from Nasopharyngeal swab specimens and  should not be used as a sole basis for treatment. Nasal washings and  aspirates are unacceptable for Xpert Xpress SARS-CoV-2/FLU/RSV  testing.  Fact Sheet for Patients: PinkCheek.be  Fact Sheet for Healthcare Providers: GravelBags.it  This test is not yet approved or cleared by the [place] FDA and  has been authorized for detection and/or diagnosis of SARS-CoV-2 by  FDA under an Emergency Use Authorization (EUA). This EUA will remain  in effect (meaning this test can be used) for the duration of the  Covid-19 declaration under Section 564(b)(1) of the Act, 21  U.S.C. section 360bbb-3(b)(1), unless the authorization is  terminated or revoked. Performed at [facility], [address] [place], [place] [address]       Studies: DG Chest 2 View  Result Date: 10/17/2019 CLINICAL DATA:  Weakness, nausea and vomiting EXAM: CHEST - 2 VIEW COMPARISON:  01/16/2013 FINDINGS: The heart size and mediastinal contours are within normal limits. Bandlike opacity within the right lung base. Mild streaky left basilar opacity. No pleural effusion. No pneumothorax. Mild superior endplate compression fracture within the midthoracic spine, at approximately T8. There may also be mild height loss at the T10 and T11 levels. IMPRESSION: 1. Bandlike opacity within the right lung base, which may represent atelectasis or scarring. 2. Multiple superior endplate compression deformities within the mid to lower thoracic spine. Similar findings were reported within this location on radiographs dated 09/22/2019.  The images from this exam were not immediately available for comparison at the time of dictation. Electronically Signed   By: Davina Poke D.O.   On: 10/17/2019 14:20   CT HEAD WO CONTRAST  Result Date: 10/17/2019 CLINICAL DATA:  Neuro deficit, acute, stroke suspected. Additional history provided: History of breast cancer, right-sided weakness which began a few days ago, arm and leg numbness, dizziness. EXAM: CT HEAD WITHOUT CONTRAST TECHNIQUE: Contiguous axial images were obtained from the base of the skull through the vertex without intravenous contrast. COMPARISON:  Report from brain MRI 12/01/1995 (images unavailable). FINDINGS: Brain: Moderate generalized  cerebral atrophy. Mild multifocal ill-defined hypoattenuation within the cerebral white matter is nonspecific, but consistent with chronic small vessel ischemic disease. There are age-indeterminate lacunar infarcts within the internal capsules bilaterally (for instance as seen on series 3, images 12 and 13). There is no acute intracranial hemorrhage. No demarcated cortical infarct. No extra-axial fluid collection. No evidence of intracranial mass. No midline shift. Vascular: No hyperdense vessel. Skull: Normal. Negative for fracture or focal lesion. Sinuses/Orbits: Visualized orbits show no acute finding. Minimal ethmoid sinus mucosal thickening. No significant mastoid effusion. IMPRESSION: No acute intracranial hemorrhage or acute demarcated cortical infarction. Age-indeterminate lacunar infarcts within the internal capsules bilaterally. Mild chronic small vessel ischemic disease. Moderate generalized cerebral atrophy. Electronically Signed   By: Kellie Simmering DO   On: 10/17/2019 10:28   MR BRAIN WO CONTRAST  Result Date: 10/17/2019 CLINICAL DATA:  Neuro deficit, acute, stroke suspected. Additional history provided: Right-sided weakness to arm and leg, numbness arm and leg, dizziness. EXAM: MRI HEAD WITHOUT CONTRAST TECHNIQUE: Multiplanar,  multiecho pulse sequences of the brain and surrounding structures were obtained without intravenous contrast. COMPARISON:  Noncontrast head CT performed earlier the same day 10/17/2019. Report from brain MRI 12/01/1995 (images unavailable). FINDINGS: Brain: Moderate generalized cerebral atrophy. There is a 10 mm focus of restricted diffusion within the left thalamus consistent with acute infarction (series 5, image 23). Multiple small T2 hyperintense foci within the bilateral basal ganglia and internal capsules appear to reflect prominent perivascular spaces. Background mild multifocal T2/FLAIR hyperintensity within the cerebral white matter which is nonspecific, but consistent with chronic small vessel ischemic disease. No evidence of intracranial mass. No chronic intracranial blood products. No extra-axial fluid collection. No midline shift. Vascular: Expected proximal arterial flow voids. Skull and upper cervical spine: No focal marrow lesion. Sinuses/Orbits: Visualized orbits show no acute finding. Mild ethmoid sinus mucosal thickening. No significant mastoid effusion. IMPRESSION: 10 mm acute infarct within the left thalamus. Mild cerebral white matter chronic small vessel ischemic disease. Moderate generalized cerebral atrophy. Mild ethmoid sinus mucosal thickening. Electronically Signed   By: Kellie Simmering DO   On: 10/17/2019 14:49    Scheduled Meds: . aspirin EC  81 mg Oral Daily  . clopidogrel  75 mg Oral Daily  . enoxaparin (LOVENOX) injection  40 mg Subcutaneous Q24H  . insulin aspart  0-5 Units Subcutaneous QHS  . insulin aspart  0-9 Units Subcutaneous TID WC  . levothyroxine  175 mcg Oral Daily  . metoprolol succinate  25 mg Oral Daily  . rosuvastatin  20 mg Oral Daily    Continuous Infusions:   LOS: 0 days     Kayleen Memos, MD [facility] Pager [phone]  If 7PM-7AM, please contact night-coverage [email or web address] Password [facility] 10/18/2019, 7:47 AM

## 2019-10-18 NOTE — Evaluation (Signed)
Occupational Therapy Evaluation Patient Details Name: Joyce Maynard MRN: [id number] DOB: 1940/03/24 Today's Date: 10/18/2019    History of Present Illness Pt is a 79 y/o F who presented to hospital on 10/17/2019 with chief complaint of R side weakness & numbness & vomiting for several days. 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 Pt also seein in ED 3 weeks ago after fall & sustaining rib fx with chest & back pain. Pt with PMH significant for HTN, HLD, DM, depression with anxiety, vertigo, breast CA (s/p lumpectomy with chemo & radiation), polio, CKD stage 3.   Clinical Impression   Ms. Godar was seen for OT evaluation this date. Prior to hospital admission, pt was living at home with her husband sho she assists with daily care. Pt reports that until ~3 weeks prior to admission she was fully independent with ADL/IADL management, however, she endorses having multiple falls (5+) in last year. Her daughter at bedside states pt was recently purchased a RW to support safety and independence with functional mobility, but pt was not able to use it much prior to onset of R sided weakness and numbness. Currently pt demonstrates impairments including R sided weakness/numbness with her RLE>RUE this date, as well as decreased activity tolerance and decreased safety awareness, which functionally limit her ability to perform ADL/self-care tasks. Pt currently requires MIN A for STS transfers and supervision for safety during functional mobility, toileting, and peri-care. Pt would benefit from skilled OT services to address noted impairments and functional limitations (see below for any additional details) in order to maximize safety and independence while minimizing falls risk and caregiver burden. Upon hospital discharge, recommend STR to maximize pt safety and return to PLOF.      Follow Up Recommendations  Home health OT;Supervision/Assistance - 24 hour    Equipment Recommendations  3 in 1 bedside commode     Recommendations for Other Services       Precautions / Restrictions Precautions Precautions: Fall Precaution Comments: R hemi Restrictions Weight Bearing Restrictions: No      Mobility Bed Mobility Overal bed mobility: Needs Assistance Bed Mobility: Supine to Sit     Supine to sit: Min guard (cuing for rolling to R side, sidelying>sitting EOB with extra time allowed)     General bed mobility comments: Deferred. Pt in recliner at start/end of session.  Transfers Overall transfer level: Needs assistance Equipment used: Rolling walker (2 wheeled) Transfers: Sit to/from Stand Sit to Stand: Min assist         General transfer comment: educationg & cuing re: safe hand placement on stable surface for sit<>stand transfers    Balance Overall balance assessment: Needs assistance;Mild deficits observed, not formally tested Sitting-balance support: Feet unsupported;No upper extremity supported Sitting balance-Leahy Scale: Good Sitting balance - Comments: steady static sitting, reaching within BOS.   Standing balance support: Single extremity supported;During functional activity Standing balance-Leahy Scale: Fair Standing balance comment: Pt able to stand to peform peri care with 1 UE support on RW, now LOB appreciated during functional task.                           ADL either performed or assessed with clinical judgement   ADL Overall ADL's : Needs assistance/impaired  General ADL Comments: MIN A for functional mobility/STS, Supervision for seated UB grooming, bathing, and self-feeding. Pt requires MIN A for toilet transfer for STS. Once standing is able to take steps to [facility] using RW. Performs peri care with supervision for safety and RW.     Vision Baseline Vision/History: Wears glasses Wears Glasses: At all times Patient Visual Report: No change from baseline       Perception     Praxis       Pertinent Vitals/Pain Pain Assessment: Faces Pain Score: 7  Pain Location: R posterior rib/back Pain Descriptors / Indicators: Aching;Radiating Pain Intervention(s): Limited activity within patient's tolerance;Monitored during session;Repositioned;Patient requesting pain meds-RN notified     Hand Dominance Right   Extremity/Trunk Assessment Upper Extremity Assessment Upper Extremity Assessment: RUE deficits/detail RUE Deficits / Details: Pt endorses RUE is numb, and feels weaker subjectively as compared to LUE. 3/5 for shoulder flexion/abduction. Elbow flexion/extension and grip strength WFL 4/5. RUE Sensation: decreased proprioception;decreased light touch RUE Coordination: decreased gross motor;decreased fine motor   Lower Extremity Assessment Lower Extremity Assessment: RLE deficits/detail;Defer to PT evaluation;Generalized weakness RLE Deficits / Details: Pt reports RLE subjectively weaker than LLE with decreased sensation t/o. Defer to PT evaluation. Generalized weakness noted with OT functional assessment. RLE Sensation: decreased light touch;decreased proprioception RLE Coordination: decreased gross motor   Cervical / Trunk Assessment Cervical / Trunk Assessment:  (rounded shoulders)   Communication Communication Communication: No difficulties   Cognition Arousal/Alertness: Awake/alert Behavior During Therapy: WFL for tasks assessed/performed Overall Cognitive Status: Within Functional Limits for tasks assessed                                     General Comments  pt quickly fatigues with mobility, limited by rib pain    Exercises Other Exercises Other Exercises: Pt/caregiver (daughter) educated on falls prevention strategies, safe use of AE/DME for ADL management, safe transfer techniques, and routines modifications to support safety and functional independence upon hospital DC. Other Exercises: OT facilitates toilet transfer using RW, STS peri-care,  and functional mobility this date. pt requires increased time/effort to perform 2/2 R side rib pain.   Shoulder Instructions      Home Living Family/patient expects to be discharged to:: Private residence Living Arrangements: Spouse/significant other (Pt is caregiver for spouse with dementia.) Available Help at Discharge: Family;Available 24 hours/day Type of Home: House Home Access: Stairs to enter [employer] of Steps: single 3" threshold step Entrance Stairs-Rails: None Home Layout: One level     Bathroom Shower/Tub: Tub/shower unit;Door   Bathroom Toilet: Standard (Has riser w/ handles)     Home Equipment: Walker - 2 wheels;Toilet riser;Grab bars - tub/shower;Grab bars - toilet          Prior Functioning/Environment Level of Independence: Independent with assistive device(s)        Comments: Pt endorses being independent with all ADL/IADL management prior to rib fx ~ 3 weeks ago. She was able to bathe herself, dress independently, and complete household chores w/o assist. She does endorse multiple falls in last year (5+) generally when pt up and moving w/o an AD.        OT Problem List: Decreased strength;Decreased coordination;Pain;Decreased cognition;Decreased safety awareness;Impaired balance (sitting and/or standing);Decreased knowledge of use of DME or AE;Impaired UE functional use      OT Treatment/Interventions: Self-care/ADL training;Therapeutic exercise;Therapeutic activities;DME and/or AE instruction;Patient/family education;Energy conservation;Balance training;Neuromuscular education  OT Goals(Current goals can be found in the care plan section) Acute Rehab OT Goals Patient Stated Goal: "To get back to being as independent as I was before" OT Goal Formulation: With patient/family Time For Goal Achievement: 11/01/19 Potential to Achieve Goals: Good  OT Frequency: Min 2X/week   Barriers to D/C:            Co-evaluation               AM-PAC OT "6 Clicks" Daily Activity     Outcome Measure Help from another person eating meals?: A Little Help from another person taking care of personal grooming?: A Little Help from another person toileting, which includes using toliet, bedpan, or urinal?: A Little Help from another person bathing (including washing, rinsing, drying)?: A Little Help from another person to put on and taking off regular upper body clothing?: A Little Help from another person to put on and taking off regular lower body clothing?: A Little 6 Click Score: 18   End of Session Equipment Utilized During Treatment: Rolling walker Nurse Communication: Mobility status;Other (comment);Patient requests pain meds (Pt urinated on BSC 200 cc's)  Activity Tolerance: Patient tolerated treatment well Patient left: in chair;with call bell/phone within reach;with chair alarm set;with family/visitor present  OT Visit Diagnosis: Other abnormalities of gait and mobility (R26.89);Muscle weakness (generalized) (M62.81);Hemiplegia and hemiparesis;Pain Hemiplegia - Right/Left: Right Hemiplegia - dominant/non-dominant: Dominant Hemiplegia - caused by: Cerebral infarction Pain - Right/Left: Right Pain - part of body:  (posterior rib pain)                Time: 1353-1436 OT Time Calculation (min): 43 min Charges:  OT General Charges $OT Visit: 1 Visit OT Evaluation $OT Eval Moderate Complexity: 1 Mod OT Treatments $Self Care/Home Management : 23-37 mins  Shara Blazing, M.S., OTR/L Ascom: [phone] 10/18/19, 3:14 PM

## 2019-10-18 NOTE — Progress Notes (Signed)
SLP Cancellation Note  Patient Details Name: Joyce Maynard MRN: [id number] DOB: 03/30/1940   Cancelled treatment:       Reason Eval/Treat Not Completed: SLP screened, no needs identified, will sign off (chart reviewed; consulted NSG then met w/ pt). Pt sitting EOB upon entering room feeding self her breakfast meal(post tests this AM). Pt denied any difficulty swallowing and is currently on a regular diet; tolerates swallowing pills w/ water per NSG. Pt conversed at conversational level w/out deficits noted; pt and NSG denied any speech-language deficits. Pt asked for sugar for her oatmeal to "sweeten it a little".  No further skilled ST services indicated as pt appears at her baseline. Pt agreed. NSG to reconsult if any change in status while admitted.      Orinda Kenner, MS, CCC-SLP Speech Language Pathologist Rehab Services [phone] [facility] 10/18/2019, 11:23 AM

## 2019-10-18 NOTE — Progress Notes (Signed)
PHARMACIST - PHYSICIAN COMMUNICATION  CONCERNING:  Enoxaparin (Lovenox) for DVT Prophylaxis    RECOMMENDATION: Patient was prescribed enoxaprin 40mg  q24 hours for VTE prophylaxis.   Filed Weights   10/17/19 0925  Weight: 93 kg (205 lb)    Body mass index is 32.11 kg/m.  Estimated Creatinine Clearance: 47.7 mL/min (A) (by C-G formula based on SCr of 1.12 mg/dL (H)).   Based on [place] patient is candidate for enoxaparin 0.5mg /kg TBW SQ every 24 hours based on BMI being >30.   DESCRIPTION: Pharmacy has adjusted enoxaparin dose per [facility] policy.  Patient is now receiving enoxaparin 47.5mg  every 24 hours    Lu Duffel, PharmD, BCPS Clinical Pharmacist 10/18/2019 2:41 PM

## 2019-10-18 NOTE — Progress Notes (Signed)
OT Cancellation Note  Patient Details Name: Joyce Maynard MRN: [id number] DOB: [birth year]   Cancelled Treatment:    Reason Eval/Treat Not Completed: Patient at procedure or test/ unavailable. Order received, chart reviewed. Upon arrival to pt room, pt noted to be OTF. Per unit transport chart pt is out for ultrasound. Will hold OT evaluation at this time and re-attempt at a later time/date as available and pt medically appropriate for OT services.   Shara Blazing, M.S., OTR/L Ascom: [phone] 10/18/19, 10:10 AM

## 2019-10-18 NOTE — Plan of Care (Signed)
  Problem: Self-Care: Goal: Ability to participate in self-care as condition permits will improve Outcome: Progressing Goal: Ability to communicate needs accurately will improve Outcome: Progressing   Problem: Nutrition: Goal: Risk of aspiration will decrease Outcome: Progressing   

## 2019-10-18 NOTE — Plan of Care (Signed)
  Problem: Education: Goal: Knowledge of disease or condition will improve 10/18/2019 0418 by Herbie Baltimore, RN Outcome: Progressing 10/18/2019 0418 by Herbie Baltimore, RN Outcome: Progressing 10/18/2019 0417 by Herbie Baltimore, RN Outcome: Progressing Goal: Knowledge of secondary prevention will improve 10/18/2019 0418 by Herbie Baltimore, RN Outcome: Progressing 10/18/2019 0418 by Herbie Baltimore, RN Outcome: Progressing 10/18/2019 0417 by Herbie Baltimore, RN Outcome: Progressing Goal: Knowledge of patient specific risk factors addressed and post discharge goals established will improve 10/18/2019 0418 by Herbie Baltimore, RN Outcome: Progressing 10/18/2019 0418 by Herbie Baltimore, RN Outcome: Progressing 10/18/2019 0417 by Herbie Baltimore, RN Outcome: Progressing   Problem: Coping: Goal: Will verbalize positive feelings about self 10/18/2019 0418 by Herbie Baltimore, RN Outcome: Progressing 10/18/2019 0418 by Herbie Baltimore, RN Outcome: Progressing 10/18/2019 0417 by Herbie Baltimore, RN Outcome: Progressing Goal: Will identify appropriate support needs 10/18/2019 0418 by Herbie Baltimore, RN Outcome: Progressing 10/18/2019 0418 by Herbie Baltimore, RN Outcome: Progressing 10/18/2019 0417 by Herbie Baltimore, RN Outcome: Progressing   Problem: Health Behavior/Discharge Planning: Goal: Ability to manage health-related needs will improve 10/18/2019 0418 by Herbie Baltimore, RN Outcome: Progressing 10/18/2019 0418 by Herbie Baltimore, RN Outcome: Progressing 10/18/2019 0417 by Herbie Baltimore, RN Outcome: Progressing   Problem: Self-Care: Goal: Ability to participate in self-care as condition permits will improve 10/18/2019 0418 by Herbie Baltimore, RN Outcome: Progressing 10/18/2019 0418 by Herbie Baltimore, RN Outcome: Progressing 10/18/2019 0417 by Herbie Baltimore,  RN Outcome: Progressing Goal: Verbalization of feelings and concerns over difficulty with self-care will improve 10/18/2019 0418 by Herbie Baltimore, RN Outcome: Progressing 10/18/2019 0418 by Herbie Baltimore, RN Outcome: Progressing 10/18/2019 0417 by Herbie Baltimore, RN Outcome: Progressing Goal: Ability to communicate needs accurately will improve 10/18/2019 0418 by Herbie Baltimore, RN Outcome: Progressing 10/18/2019 0418 by Herbie Baltimore, RN Outcome: Progressing 10/18/2019 0417 by Herbie Baltimore, RN Outcome: Progressing   Problem: Nutrition: Goal: Risk of aspiration will decrease 10/18/2019 0418 by Herbie Baltimore, RN Outcome: Progressing 10/18/2019 0418 by Herbie Baltimore, RN Outcome: Progressing 10/18/2019 0417 by Herbie Baltimore, RN Outcome: Progressing

## 2019-10-19 DIAGNOSIS — I10 Essential (primary) hypertension: Secondary | ICD-10-CM | POA: Diagnosis not present

## 2019-10-19 DIAGNOSIS — I639 Cerebral infarction, unspecified: Secondary | ICD-10-CM | POA: Diagnosis not present

## 2019-10-19 LAB — BASIC METABOLIC PANEL
Anion gap: 8 (ref 5–15)
BUN: 19 mg/dL (ref 8–23)
CO2: 28 mmol/L (ref 22–32)
Calcium: 9.1 mg/dL (ref 8.9–10.3)
Chloride: 104 mmol/L (ref 98–111)
Creatinine, Ser: 1.05 mg/dL — ABNORMAL HIGH (ref 0.44–1.00)
GFR, Estimated: 50 mL/min — ABNORMAL LOW (ref >60)
Glucose, Bld: 127 mg/dL — ABNORMAL HIGH (ref 70–99)
Potassium: 4.7 mmol/L (ref 3.5–5.1)
Sodium: 140 mmol/L (ref 135–145)

## 2019-10-19 LAB — ECHOCARDIOGRAM COMPLETE
AR max vel: 3.09 cm2
AV Area VTI: 2.99 cm2
AV Area mean vel: 2.75 cm2
AV Mean grad: 2 mmHg
AV Peak grad: 4.1 mmHg
Ao pk vel: 1.01 m/s
Area-P 1/2: 4.39 cm2
Height: 67 in
S' Lateral: 2.46 cm
Weight: 3280 oz

## 2019-10-19 LAB — GLUCOSE, CAPILLARY
Glucose-Capillary: 128 mg/dL — ABNORMAL HIGH (ref 70–99)
Glucose-Capillary: 202 mg/dL — ABNORMAL HIGH (ref 70–99)
Glucose-Capillary: 142 mg/dL — ABNORMAL HIGH (ref 70–99)
Glucose-Capillary: 137 mg/dL — ABNORMAL HIGH (ref 70–99)

## 2019-10-19 LAB — T4, FREE: Free T4: 0.91 ng/dL (ref 0.61–1.12)

## 2019-10-19 NOTE — NC FL2 (Signed)
[place] LEVEL OF CARE SCREENING TOOL     IDENTIFICATION  Patient Name: Joyce Maynard Birthdate: Sep 24, 1940 Sex: female Admission Date (Current Location): 10/17/2019  [place] and [place] Number:  Engineering geologist and Address:  [facility], [address], [place], [place] [address]      Provider Number: [id number]  Attending Physician Name and Address:  Kayleen Memos, DO  Relative Name and Phone Number:  Aleiah Mohammed [phone]    Current Level of Care: Hospital Recommended Level of Care: [place] Prior Approval Number:    Date Approved/Denied:   PASRR Number:    Discharge Plan: SNF    Current Diagnoses: Patient Active Problem List   Diagnosis Date Noted  . Acute CVA (cerebrovascular accident) ([place]) 10/18/2019  . Stroke ([place]) 10/17/2019  . Anxiety 10/17/2019  . Compression fracture of thoracic vertebra ([place]) 10/17/2019  . Nausea vomiting and diarrhea 10/17/2019  . Hypokalemia 10/17/2019  . Weakness   . CKD (chronic kidney disease), stage IIIa 12/12/2018  . Left breast lump 09/10/2015  . Health care maintenance 10/05/2014  . Mild depression ([place]) 04/16/2014  . Other malaise and fatigue 09/20/2013  . Leg pain 01/18/2013  . Essential hypertension, benign 04/18/2012  . Type II diabetes mellitus with renal manifestations ([place]) 04/18/2012  . Hypercholesterolemia 04/18/2012  . History of breast cancer 04/18/2012  . Hypothyroidism 04/18/2012  . GERD (gastroesophageal reflux disease) 04/18/2012    Orientation RESPIRATION BLADDER Height & Weight     Self, Time, Situation, Place  Normal Continent Weight: 93 kg Height:  5\' 7"  (170.2 cm)  BEHAVIORAL SYMPTOMS/MOOD NEUROLOGICAL BOWEL NUTRITION STATUS      Continent Diet (Heart Healthy)  AMBULATORY STATUS COMMUNICATION OF NEEDS Skin   Extensive Assist Verbally Normal                       Personal Care Assistance Level of Assistance  Bathing, Feeding,  Dressing Bathing Assistance: Limited assistance Feeding assistance: Independent Dressing Assistance: Limited assistance     Functional Limitations Info  Sight, Hearing, Speech Sight Info: Adequate Hearing Info: Adequate Speech Info: Adequate    SPECIAL CARE FACTORS FREQUENCY  PT (By licensed PT), OT (By licensed OT)                    Contractures Contractures Info: Not present    Additional Factors Info  Code Status, Allergies Code Status Info: Full Allergies Info: No known allergies           Current Medications (10/19/2019):  This is the current hospital active medication list Current Facility-Administered Medications  Medication Dose Route Frequency Provider Last Rate Last Admin  . acetaminophen (TYLENOL) tablet 650 mg  650 mg Oral Q4H PRN Ivor Costa, MD       Or  . acetaminophen (TYLENOL) 160 MG/5ML solution 650 mg  650 mg Per Tube Q4H PRN Ivor Costa, MD       Or  . acetaminophen (TYLENOL) suppository 650 mg  650 mg Rectal Q4H PRN Ivor Costa, MD      . aspirin EC tablet 81 mg  81 mg Oral Daily Ivor Costa, MD   81 mg at 10/19/19 0930  . clopidogrel (PLAVIX) tablet 75 mg  75 mg Oral Daily Ivor Costa, MD   75 mg at 10/19/19 0930  . enoxaparin (LOVENOX) injection 47.5 mg  0.5 mg/kg Subcutaneous Q24H Lu Duffel, RPH   47.5 mg at 10/18/19 2222  .  hydrALAZINE (APRESOLINE) injection 5 mg  5 mg Intravenous Q2H PRN Ivor Costa, MD   5 mg at 10/17/19 1712  . insulin aspart (novoLOG) injection 0-5 Units  0-5 Units Subcutaneous QHS Ivor Costa, MD      . insulin aspart (novoLOG) injection 0-9 Units  0-9 Units Subcutaneous TID WC Ivor Costa, MD   1 Units at 10/19/19 0931  . levothyroxine (SYNTHROID) tablet 175 mcg  175 mcg Oral Daily Ivor Costa, MD   175 mcg at 10/19/19 0544  . LORazepam (ATIVAN) tablet 0.5 mg  0.5 mg Oral BID BM & HS PRN Ivor Costa, MD   0.5 mg at 10/18/19 2222  . metoprolol succinate (TOPROL-XL) 24 hr tablet 25 mg  25 mg Oral Daily Ivor Costa, MD    25 mg at 10/19/19 0930  . ondansetron (ZOFRAN) injection 4 mg  4 mg Intravenous Q8H PRN Ivor Costa, MD      . oxyCODONE-acetaminophen (PERCOCET/ROXICET) 5-325 MG per tablet 1 tablet  1 tablet Oral Q4H PRN Ivor Costa, MD   1 tablet at 10/19/19 0930  . pantoprazole (PROTONIX) EC tablet 40 mg  40 mg Oral Daily PRN Ivor Costa, MD      . rosuvastatin (CRESTOR) tablet 40 mg  40 mg Oral Daily Irene Pap N, DO   40 mg at 10/19/19 0930  . senna-docusate (Senokot-S) tablet 1 tablet  1 tablet Oral QHS PRN Ivor Costa, MD         Discharge Medications: Please see discharge summary for a list of discharge medications.  Relevant Imaging Results:  Relevant Lab Results:   Additional Information SS# [id number]  Shelbie Ammons, RN

## 2019-10-19 NOTE — Discharge Summary (Signed)
PROGRESS NOTE  Joyce Maynard:[id number] DOB: 1940/01/13 DOA: 10/17/2019 PCP: Einar Pheasant, MD  HPI/Recap of past 24 hours:  Joyce Maynard is a 79 y.o. female with medical history significant of hypertension, hyperlipidemia, diabetes mellitus, GERD, hypothyroidism, depression with anxiety, vertigo, breast cancer (s/p of her lumpectomy, chemo and radiation therapy), polio, CKD stage III, who presents with left-sided weakness and numbness.  Patient states that she has been having right-sided weakness and numbness in both arms and the legs for several days.  No slurred speech, vision loss or hearing loss. She also has dizziness.  Denies chest pain, shortness breath, cough, fever or chills. Patient has nausea and vomited several times with nonbilious nonbloody vomiting.  Patient had 1 diarrhea today.  No abdominal pain.  Denies symptoms of UTI. Patient had fall several weeks ago and complains of some back pain  ED Course: pt was found to have troponin level 9 --> 6, WBC 7.6, INR 0.9, PTT 27, pending COVID-19 PCR, renal function close to baseline, temperature normal, blood pressure 113/83, heart rate 58, RR 18, oxygen saturation 99% on room air.  CT head showed age indeterminate lacunar stroke.  MRI of brain showed 10 mm left thalamus stroke.   Admitted for stroke work up.  Neurology/stroke team following.  10/19/19:  RLE weakness persists with some difficulty using her right arm.  PT assessed and recommended SNF.  TOC assisting.  Assessment/Plan: Principal Problem:   Stroke [facility]) Active Problems:   Essential hypertension, benign   Type II diabetes mellitus with renal manifestations (HCC)   Hypercholesterolemia   Hypothyroidism   GERD (gastroesophageal reflux disease)   Anxiety   Compression fracture of thoracic vertebra (HCC)   Nausea vomiting and diarrhea   Hypokalemia   Acute CVA (cerebrovascular accident) ([place])  Acute CVA involving Left Thalamus MRI brain showed acute 10 mm  left thalamus CVA Neuro stroke team following Admitted for stroke work up LDL 133 goal less that 70 A1C 7.3 goal <7.0 B/L carotid doppler US and 2D echo pending, follow results Monitor on telemetry ASA 81 and plavix 75 mg daily per neuro recs Crestor 20 mg daily at home, dose increased to 40 mg daily PT OT recommend SNF-TOC consulted for SNF placement Passed speech swallow eval Continue PT OT with assistance and fall precautions. Follow TTE results.  HLD LDL not at goal Management per above  DM2 with hyperglycemia A1C not at goal Management per above   Essential HTN Allow for permissive HTN Hold off home Toprol Xl  Chronic anxiety/depression Resume home regimen  Uncontrolled hypothyroidism likely due to medication noncompliance TSH 44 on 10/17/19 Likely noncompliance, will obtain free T4 and continue home levothyroxine 175 mcg daily. Will need to repeat TSH in 4 to 6 weeks when acute illness has resolved. Closely follow-up Free T4 results for possible change in management.  Ambulatory dysfunction in the setting of acute stroke Continue PT OT with assistance Supervision 24 H    Code Status: Full   Family Communication: Updated her son via phone  Disposition Plan: Home or SNF after stroke workup is completed   Consultants:  Neurology  Procedures:  TTE   Antimicrobials:  None   DVT prophylaxis:  SQ Lovenox daily  Status is: Inpatient    Dispo: The patient is from:Home              Anticipated d/c is to:SNF              Anticipated d/c date is:  10/20/19, pending SNF placement               Patient currently not stable for dc, ongoing stroke work up        Objective: Vitals:   10/19/19 0353 10/19/19 0800 10/19/19 1155 10/19/19 1505  BP: (!) 162/97 (!) 152/73 137/76 118/67  Pulse: 69 (!) 57 (!) 55 (!) 52  Resp: 18 16 17 15   Temp: 98.3 F (36.8 C) 98.1 F (36.7 C) 98.4 F (36.9 C) 98.2 F (36.8 C)  TempSrc: Oral  Oral   SpO2: 100% 99%  99% 99%  Weight:      Height:        Intake/Output Summary (Last 24 hours) at 10/19/2019 1522 Last data filed at 10/19/2019 1024 Gross per 24 hour  Intake 120 ml  Output 700 ml  Net -580 ml   Filed Weights   10/17/19 0925  Weight: 93 kg    Exam:  . General: 79 y.o. year-old female Well developed well nourished in NAD.  A&O x 3 . Cardiovascular: RRR no rubs or gallops . Respiratory: CTA no wheezes or rales . Abdomen: Soft NT ND NBS . Musculoskeletal: No LE edema. 2/4 pulses in all 4 extremities. RLE 4/5 strength, mild R hand dysmetria with finger to nose. Marland Kitchen Psychiatry: Mood is appropriate   Data Reviewed: CBC: Recent Labs  Lab 10/17/19 0928  WBC 7.6  NEUTROABS 5.6  HGB 14.5  HCT 42.3  MCV 101.9*  PLT 947   Basic Metabolic Panel: Recent Labs  Lab 10/17/19 0928 10/19/19 0307  NA 140 140  K 3.3* 4.7  CL 105 104  CO2 24 28  GLUCOSE 184* 127*  BUN 16 19  CREATININE 1.12* 1.05*  CALCIUM 9.4 9.1  MG 2.0  --    GFR: Estimated Creatinine Clearance: 50.9 mL/min (A) (by C-G formula based on SCr of 1.05 mg/dL (H)). Liver Function Tests: Recent Labs  Lab 10/17/19 0928  AST 39  ALT 20  ALKPHOS 102  BILITOT 0.9  PROT 8.0  ALBUMIN 4.1   Recent Labs  Lab 10/17/19 0928  LIPASE 20   No results for input(s): AMMONIA in the last 168 hours. Coagulation Profile: Recent Labs  Lab 10/17/19 0928  INR 0.9   Cardiac Enzymes: No results for input(s): CKTOTAL, CKMB, CKMBINDEX, TROPONINI in the last 168 hours. BNP (last 3 results) No results for input(s): PROBNP in the last 8760 hours. HbA1C: Recent Labs    10/17/19 0928  HGBA1C 7.3*   CBG: Recent Labs  Lab 10/18/19 1144 10/18/19 1638 10/18/19 2120 10/19/19 0800 10/19/19 1156  GLUCAP 163* 172* 141* 128* 202*   Lipid Profile: Recent Labs    10/18/19 0416  CHOL 214*  HDL 39*  LDLCALC 133*  TRIG 211*  CHOLHDL 5.5   Thyroid Function Tests: Recent Labs    10/17/19 0928  TSH 44.545*    Anemia Panel: No results for input(s): VITAMINB12, FOLATE, FERRITIN, TIBC, IRON, RETICCTPCT in the last 72 hours. Urine analysis:    Component Value Date/Time   COLORURINE YELLOW (A) 10/18/2019 0238   APPEARANCEUR CLOUDY (A) 10/18/2019 0238   LABSPEC 1.024 10/18/2019 0238   PHURINE 5.0 10/18/2019 0238   GLUCOSEU NEGATIVE 10/18/2019 0238   GLUCOSEU 100 (A) 09/23/2015 1258   HGBUR SMALL (A) 10/18/2019 0238   BILIRUBINUR NEGATIVE 10/18/2019 0238   KETONESUR NEGATIVE 10/18/2019 0238   PROTEINUR NEGATIVE 10/18/2019 0238   UROBILINOGEN 0.2 09/23/2015 1258   NITRITE NEGATIVE 10/18/2019 0238   LEUKOCYTESUR  LARGE (A) 10/18/2019 0238   Sepsis Labs: @LABRCNTIP (procalcitonin:4,lacticidven:4)  ) Recent Results (from the past 240 hour(s))  Respiratory Panel by RT PCR (Flu A&B, Covid) - Nasopharyngeal Swab     Status: None   Collection Time: 10/17/19  1:59 PM   Specimen: Nasopharyngeal Swab  Result Value Ref Range Status   SARS Coronavirus 2 by RT PCR NEGATIVE NEGATIVE Final    Comment: (NOTE) SARS-CoV-2 target nucleic acids are NOT DETECTED.  The SARS-CoV-2 RNA is generally detectable in upper respiratoy specimens during the acute phase of infection. The lowest concentration of SARS-CoV-2 viral copies this assay can detect is 131 copies/mL. A negative result does not preclude SARS-Cov-2 infection and should not be used as the sole basis for treatment or other patient management decisions. A negative result may occur with  improper specimen collection/handling, submission of specimen other than nasopharyngeal swab, presence of viral mutation(s) within the areas targeted by this assay, and inadequate number of viral copies (<131 copies/mL). A negative result must be combined with clinical observations, patient history, and epidemiological information. The expected result is Negative.  Fact Sheet for Patients:  PinkCheek.be  Fact Sheet for Healthcare  Providers:  GravelBags.it  This test is no t yet approved or cleared by the [place] FDA and  has been authorized for detection and/or diagnosis of SARS-CoV-2 by FDA under an Emergency Use Authorization (EUA). This EUA will remain  in effect (meaning this test can be used) for the duration of the COVID-19 declaration under Section 564(b)(1) of the Act, 21 U.S.C. section 360bbb-3(b)(1), unless the authorization is terminated or revoked sooner.     Influenza A by PCR NEGATIVE NEGATIVE Final   Influenza B by PCR NEGATIVE NEGATIVE Final    Comment: (NOTE) The Xpert Xpress SARS-CoV-2/FLU/RSV assay is intended as an aid in  the diagnosis of influenza from Nasopharyngeal swab specimens and  should not be used as a sole basis for treatment. Nasal washings and  aspirates are unacceptable for Xpert Xpress SARS-CoV-2/FLU/RSV  testing.  Fact Sheet for Patients: PinkCheek.be  Fact Sheet for Healthcare Providers: GravelBags.it  This test is not yet approved or cleared by the [place] FDA and  has been authorized for detection and/or diagnosis of SARS-CoV-2 by  FDA under an Emergency Use Authorization (EUA). This EUA will remain  in effect (meaning this test can be used) for the duration of the  Covid-19 declaration under Section 564(b)(1) of the Act, 21  U.S.C. section 360bbb-3(b)(1), unless the authorization is  terminated or revoked. Performed at [facility], [address], [place], [place] [address]       Studies: No results found.  Scheduled Meds: . aspirin EC  81 mg Oral Daily  . clopidogrel  75 mg Oral Daily  . enoxaparin (LOVENOX) injection  0.5 mg/kg Subcutaneous Q24H  . insulin aspart  0-5 Units Subcutaneous QHS  . insulin aspart  0-9 Units Subcutaneous TID WC  . levothyroxine  175 mcg Oral Daily  . metoprolol succinate  25 mg Oral Daily  . rosuvastatin  40 mg Oral  Daily    Continuous Infusions:   LOS: 1 day     Kayleen Memos, MD [facility] Pager [phone]  If 7PM-7AM, please contact night-coverage [email or web address] Password [email or web address] 10/19/2019, 3:22 PM

## 2019-10-19 NOTE — TOC Progression Note (Signed)
Transition of Care [facility]) - Progression Note    Patient Details  Name: Joyce Maynard MRN: [id number] Date of Birth: 11-17-1940  Transition of Care [facility]) CM/SW [place], RN Phone Number: 10/19/2019, 3:55 PM  Clinical Narrative:   RNCM reached out to patient's son [name] and bed offers were presented after some discussion patient chose [facility] for d/c plan. RNCM reached out to Susan Moore with [facility] and they will start insurance authorization.     Expected Discharge Plan: [place] Barriers to Discharge: No Barriers Identified  Expected Discharge Plan and Services Expected Discharge Plan: [place] Choice: [place]   Expected Discharge Date: 10/19/19                                     Social Determinants of Health (SDOH) Interventions    Readmission Risk Interventions No flowsheet data found.

## 2019-10-19 NOTE — TOC Initial Note (Signed)
Transition of Care [facility]) - Initial/Assessment Note    Patient Details  Name: DEMARIS BOUSQUET MRN: [id number] Date of Birth: 1940-09-23  Transition of Care [facility]) CM/SW Contact:    Shelbie Ammons, RN Phone Number: 10/19/2019, 10:39 AM  Clinical Narrative:    RNCM met with patient in room and spoke with children on phone at same time. Patient reports to feeling ok today, but is sitting up in chair and appears uncomfortable. Patient is quiet and requests that this CM speaks with children. Discussed current recommendations for skilled rehab and after some discussion patient and family members are agreeable to a bed search, they do not have any initial requests for a certain facility.  RNCM completed PASSR, FL-2 and started bed search.                Expected Discharge Plan: Skilled Nursing Facility Barriers to Discharge: No Barriers Identified   Patient Goals and CMS Choice   CMS Medicare.gov Compare Post Acute Care list provided to:: Patient Choice offered to / list presented to : Patient  Expected Discharge Plan and Services Expected Discharge Plan: [place]     Post Acute Care Choice: [place]   Expected Discharge Date: 10/19/19                                    Prior Living Arrangements/Services   Lives with:: Self Patient language and need for interpreter reviewed:: Yes Do you feel safe going back to the place where you live?: Yes      Need for Family Participation in Patient Care: Yes (Comment) Care giver support system in place?: Yes (comment)   Criminal Activity/Legal Involvement Pertinent to Current Situation/Hospitalization: No - Comment as needed  Activities of Daily Living Home Assistive Devices/Equipment: Walker (specify type) ADL Screening (condition at time of admission) Patient's cognitive ability adequate to safely complete daily activities?: Yes Is the patient deaf or have difficulty hearing?: No Does the patient have  difficulty seeing, even when wearing glasses/contacts?: No Does the patient have difficulty concentrating, remembering, or making decisions?: No Patient able to express need for assistance with ADLs?: Yes Does the patient have difficulty dressing or bathing?: No Independently performs ADLs?: No Communication: Independent Dressing (OT): Needs assistance Is this a change from baseline?: Change from baseline, expected to last >3 days Grooming: Needs assistance Is this a change from baseline?: Change from baseline, expected to last >3 days Feeding: Independent Bathing: Needs assistance Is this a change from baseline?: Change from baseline, expected to last >3 days Toileting: Needs assistance Is this a change from baseline?: Change from baseline, expected to last >3days In/Out Bed: Needs assistance Is this a change from baseline?: Change from baseline, expected to last >3 days Walks in Home: Needs assistance Is this a change from baseline?: Change from baseline, expected to last >3 days Does the patient have difficulty walking or climbing stairs?: Yes Weakness of Legs: Both Weakness of Arms/Hands: Both  Permission Sought/Granted                  Emotional Assessment Appearance:: Appears stated age Attitude/Demeanor/Rapport: Guarded Affect (typically observed): Appropriate Orientation: : Oriented to Self, Oriented to Place, Oriented to  Time, Oriented to Situation Alcohol / Substance Use: Not Applicable Psych Involvement: No (comment)  Admission diagnosis:  Hypokalemia [E87.6] Weakness [R53.1] Stroke ([place]) [I63.9] TSH elevation [R79.89] Cerebrovascular accident (CVA), unspecified mechanism ([place]) [  I63.9] Nausea and vomiting, intractability of vomiting not specified, unspecified vomiting type [R11.2] Acute CVA (cerebrovascular accident) [facility]) [I63.9] Patient Active Problem List   Diagnosis Date Noted  . Acute CVA (cerebrovascular accident) ([place]) 10/18/2019  . Stroke ([place])  10/17/2019  . Anxiety 10/17/2019  . Compression fracture of thoracic vertebra ([place]) 10/17/2019  . Nausea vomiting and diarrhea 10/17/2019  . Hypokalemia 10/17/2019  . Weakness   . CKD (chronic kidney disease), stage IIIa 12/12/2018  . Left breast lump 09/10/2015  . Health care maintenance 10/05/2014  . Mild depression ([place]) 04/16/2014  . Other malaise and fatigue 09/20/2013  . Leg pain 01/18/2013  . Essential hypertension, benign 04/18/2012  . Type II diabetes mellitus with renal manifestations ([place]) 04/18/2012  . Hypercholesterolemia 04/18/2012  . History of breast cancer 04/18/2012  . Hypothyroidism 04/18/2012  . GERD (gastroesophageal reflux disease) 04/18/2012   PCP:  Einar Pheasant, MD Pharmacy:   [facility] [address] ([address]), [place] [address] [place]) [place] [address] Phone: [phone] Fax: [phone]     Social Determinants of Health (SDOH) Interventions    Readmission Risk Interventions No flowsheet data found.

## 2019-10-19 NOTE — TOC Progression Note (Signed)
Transition of Care [facility]) - Progression Note    Patient Details  Name: KRISTELL WOODING MRN: [id number] Date of Birth: [birth year]  Transition of Care [facility]) CM/SW [place], [place] Phone Number: 10/19/2019, 1:35 PM  Clinical Narrative:     30 day note, H&P, FL2 and clinical progress notes uploaded to [facility] Must website for PASSR application.  Expected Discharge Plan: [place] Barriers to Discharge: No Barriers Identified  Expected Discharge Plan and Services Expected Discharge Plan: [place] Choice: [place]   Expected Discharge Date: 10/19/19                                     Social Determinants of Health (SDOH) Interventions    Readmission Risk Interventions No flowsheet data found.

## 2019-10-19 NOTE — Progress Notes (Signed)
Subjective: No new neurological complaints.    Objective: Current vital signs: BP (!) 152/73 (BP Location: Left Arm)   Pulse (!) 57   Temp 98.1 F (36.7 C)   Resp 16   Ht 5\' 7"  (1.702 m)   Wt 93 kg   SpO2 99%   BMI 32.11 kg/m  Vital signs in last 24 hours: Temp:  [97.7 F (36.5 C)-98.6 F (37 C)] 98.1 F (36.7 C) (10/14 0800) Pulse Rate:  [54-69] 57 (10/14 0800) Resp:  [15-19] 16 (10/14 0800) BP: (135-169)/(63-97) 152/73 (10/14 0800) SpO2:  [99 %-100 %] 99 % (10/14 0800)  Intake/Output from previous day: 10/13 0701 - 10/14 0700 In: -  Out: 400 [Urine:400] Intake/Output this shift: Total I/O In: -  Out: 300 [Urine:300] Nutritional status:  Diet Order            Diet Heart Room service appropriate? Yes; Fluid consistency: Thin  Diet effective now                 Neurologic Exam: Mental Status: Alert, oriented, thought content appropriate. Speech fluent without evidence of aphasia. Able to follow 3 step commands without difficulty. Cranial Nerves: II: Discs flat bilaterally; Visual fields grossly normal, pupils equal, round, reactive to light and accommodation III,IV, VI: ptosis not present, extra-ocular motions intact bilaterally V,VII: smile symmetric, facial light touch sensation normal bilaterally VIII: hearing normal bilaterally IX,X: gag reflex present XI: bilateral shoulder shrug XII: midline tongue extension Motor: Right :Upper extremity 5-/5Left: Upper extremity 5/5 Lower extremity 4/5Lower extremity 5/5 Tone and bulk:normal tone throughout; no atrophy noted Sensory: Pinprick and light touchdecreased in the right upper and lower extremities Deep Tendon Reflexes:Symmetric throughout Plantars: Right:muteLeft: mute Cerebellar: Dysmetria withfinger-to-noseandheel-to-shin testing in the right upper and  lower extremities   Lab Results: Basic Metabolic Panel: Recent Labs  Lab 10/17/19 0928 10/19/19 0307  NA 140 140  K 3.3* 4.7  CL 105 104  CO2 24 28  GLUCOSE 184* 127*  BUN 16 19  CREATININE 1.12* 1.05*  CALCIUM 9.4 9.1  MG 2.0  --     Liver Function Tests: Recent Labs  Lab 10/17/19 0928  AST 39  ALT 20  ALKPHOS 102  BILITOT 0.9  PROT 8.0  ALBUMIN 4.1   Recent Labs  Lab 10/17/19 0928  LIPASE 20   No results for input(s): AMMONIA in the last 168 hours.  CBC: Recent Labs  Lab 10/17/19 0928  WBC 7.6  NEUTROABS 5.6  HGB 14.5  HCT 42.3  MCV 101.9*  PLT 189    Cardiac Enzymes: No results for input(s): CKTOTAL, CKMB, CKMBINDEX, TROPONINI in the last 168 hours.  Lipid Panel: Recent Labs  Lab 10/18/19 0416  CHOL 214*  TRIG 211*  HDL 39*  CHOLHDL 5.5  VLDL 42*  LDLCALC 133*    CBG: Recent Labs  Lab 10/18/19 0808 10/18/19 1144 10/18/19 1638 10/18/19 2120 10/19/19 0800  GLUCAP 158* 163* 172* 141* 128*    Microbiology: Results for orders placed or performed during the hospital encounter of 10/17/19  Respiratory Panel by RT PCR (Flu A&B, Covid) - Nasopharyngeal Swab     Status: None   Collection Time: 10/17/19  1:59 PM   Specimen: Nasopharyngeal Swab  Result Value Ref Range Status   SARS Coronavirus 2 by RT PCR NEGATIVE NEGATIVE Final    Comment: (NOTE) SARS-CoV-2 target nucleic acids are NOT DETECTED.  The SARS-CoV-2 RNA is generally detectable in upper respiratoy specimens during the acute phase of infection. The  lowest concentration of SARS-CoV-2 viral copies this assay can detect is 131 copies/mL. A negative result does not preclude SARS-Cov-2 infection and should not be used as the sole basis for treatment or other patient management decisions. A negative result may occur with  improper specimen collection/handling, submission of specimen other than nasopharyngeal swab, presence of viral mutation(s) within the areas targeted by this  assay, and inadequate number of viral copies (<131 copies/mL). A negative result must be combined with clinical observations, patient history, and epidemiological information. The expected result is Negative.  Fact Sheet for Patients:  PinkCheek.be  Fact Sheet for Healthcare Providers:  GravelBags.it  This test is no t yet approved or cleared by the [place] FDA and  has been authorized for detection and/or diagnosis of SARS-CoV-2 by FDA under an Emergency Use Authorization (EUA). This EUA will remain  in effect (meaning this test can be used) for the duration of the COVID-19 declaration under Section 564(b)(1) of the Act, 21 U.S.C. section 360bbb-3(b)(1), unless the authorization is terminated or revoked sooner.     Influenza A by PCR NEGATIVE NEGATIVE Final   Influenza B by PCR NEGATIVE NEGATIVE Final    Comment: (NOTE) The Xpert Xpress SARS-CoV-2/FLU/RSV assay is intended as an aid in  the diagnosis of influenza from Nasopharyngeal swab specimens and  should not be used as a sole basis for treatment. Nasal washings and  aspirates are unacceptable for Xpert Xpress SARS-CoV-2/FLU/RSV  testing.  Fact Sheet for Patients: PinkCheek.be  Fact Sheet for Healthcare Providers: GravelBags.it  This test is not yet approved or cleared by the [place] FDA and  has been authorized for detection and/or diagnosis of SARS-CoV-2 by  FDA under an Emergency Use Authorization (EUA). This EUA will remain  in effect (meaning this test can be used) for the duration of the  Covid-19 declaration under Section 564(b)(1) of the Act, 21  U.S.C. section 360bbb-3(b)(1), unless the authorization is  terminated or revoked. Performed at [facility], [address] [place], [place] [address]     Coagulation Studies: Recent Labs    10/17/19 4235  LABPROT 11.6  INR  0.9    Imaging: DG Chest 2 View  Result Date: 10/17/2019 CLINICAL DATA:  Weakness, nausea and vomiting EXAM: CHEST - 2 VIEW COMPARISON:  01/16/2013 FINDINGS: The heart size and mediastinal contours are within normal limits. Bandlike opacity within the right lung base. Mild streaky left basilar opacity. No pleural effusion. No pneumothorax. Mild superior endplate compression fracture within the midthoracic spine, at approximately T8. There may also be mild height loss at the T10 and T11 levels. IMPRESSION: 1. Bandlike opacity within the right lung base, which may represent atelectasis or scarring. 2. Multiple superior endplate compression deformities within the mid to lower thoracic spine. Similar findings were reported within this location on radiographs dated 09/22/2019. The images from this exam were not immediately available for comparison at the time of dictation. Electronically Signed   By: Davina Poke D.O.   On: 10/17/2019 14:20   CT HEAD WO CONTRAST  Result Date: 10/17/2019 CLINICAL DATA:  Neuro deficit, acute, stroke suspected. Additional history provided: History of breast cancer, right-sided weakness which began a few days ago, arm and leg numbness, dizziness. EXAM: CT HEAD WITHOUT CONTRAST TECHNIQUE: Contiguous axial images were obtained from the base of the skull through the vertex without intravenous contrast. COMPARISON:  Report from brain MRI 12/01/1995 (images unavailable). FINDINGS: Brain: Moderate generalized cerebral atrophy. Mild multifocal ill-defined hypoattenuation within the  cerebral white matter is nonspecific, but consistent with chronic small vessel ischemic disease. There are age-indeterminate lacunar infarcts within the internal capsules bilaterally (for instance as seen on series 3, images 12 and 13). There is no acute intracranial hemorrhage. No demarcated cortical infarct. No extra-axial fluid collection. No evidence of intracranial mass. No midline shift. Vascular:  No hyperdense vessel. Skull: Normal. Negative for fracture or focal lesion. Sinuses/Orbits: Visualized orbits show no acute finding. Minimal ethmoid sinus mucosal thickening. No significant mastoid effusion. IMPRESSION: No acute intracranial hemorrhage or acute demarcated cortical infarction. Age-indeterminate lacunar infarcts within the internal capsules bilaterally. Mild chronic small vessel ischemic disease. Moderate generalized cerebral atrophy. Electronically Signed   By: Kellie Simmering DO   On: 10/17/2019 10:28   MR BRAIN WO CONTRAST  Result Date: 10/17/2019 CLINICAL DATA:  Neuro deficit, acute, stroke suspected. Additional history provided: Right-sided weakness to arm and leg, numbness arm and leg, dizziness. EXAM: MRI HEAD WITHOUT CONTRAST TECHNIQUE: Multiplanar, multiecho pulse sequences of the brain and surrounding structures were obtained without intravenous contrast. COMPARISON:  Noncontrast head CT performed earlier the same day 10/17/2019. Report from brain MRI 12/01/1995 (images unavailable). FINDINGS: Brain: Moderate generalized cerebral atrophy. There is a 10 mm focus of restricted diffusion within the left thalamus consistent with acute infarction (series 5, image 23). Multiple small T2 hyperintense foci within the bilateral basal ganglia and internal capsules appear to reflect prominent perivascular spaces. Background mild multifocal T2/FLAIR hyperintensity within the cerebral white matter which is nonspecific, but consistent with chronic small vessel ischemic disease. No evidence of intracranial mass. No chronic intracranial blood products. No extra-axial fluid collection. No midline shift. Vascular: Expected proximal arterial flow voids. Skull and upper cervical spine: No focal marrow lesion. Sinuses/Orbits: Visualized orbits show no acute finding. Mild ethmoid sinus mucosal thickening. No significant mastoid effusion. IMPRESSION: 10 mm acute infarct within the left thalamus. Mild cerebral  white matter chronic small vessel ischemic disease. Moderate generalized cerebral atrophy. Mild ethmoid sinus mucosal thickening. Electronically Signed   By: Kellie Simmering DO   On: 10/17/2019 14:49   US Carotid Bilateral  Result Date: 10/18/2019 CLINICAL DATA:  Acute infarct within the left thalamus. EXAM: BILATERAL CAROTID DUPLEX ULTRASOUND TECHNIQUE: Pearline Cables scale imaging, color Doppler and duplex ultrasound were performed of bilateral carotid and vertebral arteries in the neck. COMPARISON:  MRI 10/17/2019 FINDINGS: Criteria: Quantification of carotid stenosis is based on velocity parameters that correlate the residual internal carotid diameter with NASCET-based stenosis levels, using the diameter of the distal internal carotid lumen as the denominator for stenosis measurement. The following velocity measurements were obtained: RIGHT ICA: 63/15 cm/sec CCA: 43/32 cm/sec SYSTOLIC ICA/CCA RATIO:  1.3 ECA: 78 cm/sec LEFT ICA: 64/17 cm/sec CCA: 95/18 cm/sec SYSTOLIC ICA/CCA RATIO:  1.0 ECA: 58 cm/sec RIGHT CAROTID ARTERY: Small amount of plaque at the right carotid bulb and proximal internal carotid artery. Normal waveforms and velocities in the internal carotid artery. External carotid artery is patent with normal waveform. RIGHT VERTEBRAL ARTERY: Antegrade flow and normal waveform in the right vertebral artery. LEFT CAROTID ARTERY: Small amount of echogenic plaque at the left carotid bulb. External carotid artery is patent with normal waveform. Normal waveforms and velocities in the internal carotid artery. LEFT VERTEBRAL ARTERY: Antegrade flow and normal waveform in the left vertebral artery. 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 No significant stenosis. Estimated degree of stenosis in the internal carotid arteries is less than 50% bilaterally. Patent vertebral arteries with antegrade flow. Electronically Signed  By: Markus Daft M.D.   On: 10/18/2019 13:39    Medications:  I  have reviewed the patient's current medications. Scheduled: . aspirin EC  81 mg Oral Daily  . clopidogrel  75 mg Oral Daily  . enoxaparin (LOVENOX) injection  0.5 mg/kg Subcutaneous Q24H  . insulin aspart  0-5 Units Subcutaneous QHS  . insulin aspart  0-9 Units Subcutaneous TID WC  . levothyroxine  175 mcg Oral Daily  . metoprolol succinate  25 mg Oral Daily  . rosuvastatin  40 mg Oral Daily    Assessment/Plan: 79 y.o.femalewith a history of HTN, HLD and DM who presents with a 5-6 day history of right hemiparesis. Patient reports going to bed either Wednesday or Thursday of last week and awakening the next day with right sided weakness and numbness. With no improvement she was brought to the ED today. Initial NIHSS of 5.  On ASA and statin prior to admission. MRI of the brain personally reviewed and reveals an acute left thalamic infarct. Etiology likely small vessel disease. Carotid dopplers show no evidence of hemodynamically significant stenosis.  Echocardiogram pending.  A1c 7.3, LDL 133.  Recommendations: 1. Blood sugar management with target A1c<7.0.  2. Lipid management with target LDL<70.  3. PT consult, OT consult, Speech therapy 4. Echocardiogram pending 5. Prophylactic therapy-Dual antiplatelet therapy with ASA 81mg  and Plavix 75mg  for three weeks with change toPlavix 75mg  dailyalone as monotherapy after that time. 6. Telemetry monitoring 7. Frequent neuro checks 8. BP management with target BP<140/80 9. Patient to follow up with neurology on an outpatient basis after discharge   LOS: 1 day   Alexis Goodell, MD Neurology  10/19/2019  9:27 AM

## 2019-10-19 NOTE — Progress Notes (Signed)
Physical Therapy Treatment Patient Details Name: CODEE BLOODWORTH MRN: [id number] DOB: [birth year] Today's Date: 10/19/2019    History of Present Illness Pt is a 79 y/o F who presented to hospital on 10/17/2019 with chief complaint of R side weakness & numbness & vomiting for several days. 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 Pt also seein in ED 3 weeks ago after fall & sustaining rib fx with chest & back pain. Pt with PMH significant for HTN, HLD, DM, depression with anxiety, vertigo, breast CA (s/p lumpectomy with chemo & radiation), polio, CKD stage 3.    PT Comments    Pt continues to require min assist with RW for short distance ambulation in room, and mod cuing for safety awareness. PT does educate & provide demo for single step negotiation with RW in gym and when pt stands to attempt pt has LOB requiring mod to correct with pt reporting she was trying to sit down without notifying PT beforehand nor ensuring seat was behind her with pt also reporting fatigue so stair negotiation deferred at this time. Pt does educate pt on benefits of rehab prior to returning home & wishes for her children to be made aware & discuss this - social worker notified of ongoing recommendations of SNF.   Pt would benefit from ongoing PT services to focus on endurance, strengthening, R NMR, gait & stair training, and balance retraining.  Vitals during session: After ambulating within room & sitting in recliner: BP = 164/93 mmHg (LUE), HR = 106 bpm (per dinamap/BP cuff) Sitting in recliner at end of session: BP = 149/92 mmHg (LUE), HR = 54 bpm (per pulse ox on finger)    Follow Up Recommendations  Supervision/Assistance - 24 hour;SNF     Equipment Recommendations   (TBD in next venue)    Recommendations for Other Services       Precautions / Restrictions Precautions Precautions: Fall Precaution Comments: R hemi Restrictions Weight Bearing Restrictions: No    Mobility  Bed Mobility Overal bed mobility:  Needs Assistance Bed Mobility: Supine to Sit     Supine to sit: Supervision     General bed mobility comments: extra time 2/2 weakness & R posterior rib pain  Transfers Overall transfer level: Needs assistance Equipment used: Rolling walker (2 wheeled) Transfers: Sit to/from Stand Sit to Stand: Min assist         General transfer comment: cuing for safe hand placement (at least 1UE pushing up on stable surface & reaching back for stables surface), impaired coordination R hand  Ambulation/Gait Ambulation/Gait assistance: Min assist Gait Distance (Feet): 10 Feet Assistive device: Rolling walker (2 wheeled) Gait Pattern/deviations: Decreased stride length;Decreased stance time - right;Decreased step length - left;Decreased step length - right Gait velocity: decreased   General Gait Details: cuing for RW management & obstacle avoidance when turning around in room.   Stairs             Wheelchair Mobility    Modified Rankin (Stroke Patients Only)       Balance Overall balance assessment: Needs assistance;Mild deficits observed, not formally tested Sitting-balance support: Feet unsupported;No upper extremity supported Sitting balance-Leahy Scale: Fair     Standing balance support: Single extremity supported;During functional activity Standing balance-Leahy Scale: Poor Standing balance comment: BUE support on RW                            Cognition Arousal/Alertness: Awake/alert Behavior During Therapy: [facility]  for tasks assessed/performed Overall Cognitive Status: Within Functional Limits for tasks assessed                                 General Comments: pt attempts to sit in gym without notifying PT nor ensuring chair is behind her      Exercises      General Comments        Pertinent Vitals/Pain Pain Assessment: 0-10 Pain Score: 6  (with movement) Pain Location: R posterior rib/back Pain Descriptors / Indicators:  Sore Pain Intervention(s): Limited activity within patient's tolerance;Monitored during session;Patient requesting pain meds-RN notified    Home Living                      Prior Function            PT Goals (current goals can now be found in the care plan section) Acute Rehab PT Goals Patient Stated Goal: to go home PT Goal Formulation: With patient Time For Goal Achievement: 11/01/19 Potential to Achieve Goals: Good Progress towards PT goals: Progressing toward goals    Frequency    7X/week      PT Plan Current plan remains appropriate    Co-evaluation              AM-PAC PT "6 Clicks" Mobility   Outcome Measure  Help needed turning from your back to your side while in a flat bed without using bedrails?: A Little Help needed moving from lying on your back to sitting on the side of a flat bed without using bedrails?: A Little Help needed moving to and from a bed to a chair (including a wheelchair)?: A Little Help needed standing up from a chair using your arms (e.g., wheelchair or bedside chair)?: A Little Help needed to walk in hospital room?: A Little Help needed climbing 3-5 steps with a railing? : A Lot 6 Click Score: 17    End of Session Equipment Utilized During Treatment: Gait belt Activity Tolerance: Patient limited by fatigue Patient left:  (in handoff to NT assisting pt to [facility]) Nurse Communication: Mobility status PT Visit Diagnosis: Unsteadiness on feet (R26.81);Other abnormalities of gait and mobility (R26.89);Muscle weakness (generalized) (M62.81);Difficulty in walking, not elsewhere classified (R26.2);Other symptoms and signs involving the nervous system (R29.898);Hemiplegia and hemiparesis Hemiplegia - Right/Left: Right Hemiplegia - dominant/non-dominant: Dominant Hemiplegia - caused by: Cerebral infarction     Time: 0822-0903 PT Time Calculation (min) (ACUTE ONLY): 41 min  Charges:  $Gait Training: 8-22 mins $Therapeutic  Activity: 23-37 mins                     Lavone Nian, PT, DPT 10/19/19, 9:52 AM

## 2019-10-20 DIAGNOSIS — I10 Essential (primary) hypertension: Secondary | ICD-10-CM | POA: Diagnosis not present

## 2019-10-20 LAB — GLUCOSE, CAPILLARY
Glucose-Capillary: 149 mg/dL — ABNORMAL HIGH (ref 70–99)
Glucose-Capillary: 229 mg/dL — ABNORMAL HIGH (ref 70–99)
Glucose-Capillary: 142 mg/dL — ABNORMAL HIGH (ref 70–99)
Glucose-Capillary: 140 mg/dL — ABNORMAL HIGH (ref 70–99)

## 2019-10-20 NOTE — Progress Notes (Signed)
Physical Therapy Treatment Patient Details Name: Joyce Maynard MRN: [id number] DOB: 03-22-1940 Today's Date: 10/20/2019    History of Present Illness Pt is a 79 y/o F who presented to hospital on 10/17/2019 with chief complaint of R side weakness & numbness & vomiting for several days. 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 Pt also seein in ED 3 weeks ago after fall & sustaining rib fx with chest & back pain. Pt with PMH significant for HTN, HLD, DM, depression with anxiety, vertigo, breast CA (s/p lumpectomy with chemo & radiation), polio, CKD stage 3.    PT Comments    Pt was long sitting in bed upon arriving. Agreeable to PT session and cooperative and pleasant throughout. Session limited by rib pain more so than neuromuscular limitations. She was able to exit R side of bed with supervision + increased time and use of bed rails. She was requested not to ambulate into hallway but is willing to ambulate in room. Required increased assistance to stand this date and ambulate. Pt is at high fall risk. Will greatly benefit from continued skilled PT at DC to address deficits and improve independence. At conclsuion of session, pt was in recliner with call bell in reach and chair alarm in place. Acute PT will continue to follow per POC and progress as able per pt tolerance.    Follow Up Recommendations  Supervision/Assistance - 24 hour;SNF;Other (comment)     Equipment Recommendations  Other (comment) (defer to next level of care)    Recommendations for Other Services       Precautions / Restrictions Precautions Precautions: Fall Precaution Comments: R hemi (slight) Restrictions Weight Bearing Restrictions: No    Mobility  Bed Mobility Overal bed mobility: Needs Assistance Bed Mobility: Supine to Sit     Supine to sit: Supervision     General bed mobility comments: Increased time to perform with use of bedrail. performed rolling >short sit 2/2 to pain/weakness.   Transfers Overall  transfer level: Needs assistance Equipment used: Rolling walker (2 wheeled) Transfers: Sit to/from Stand Sit to Stand: Mod assist;From elevated surface         General transfer comment: pt required mod assist to stand from slightly elevated bed height. she states it was harder to stan today than it has been. She required vcs for proper handplacement.  Ambulation/Gait Ambulation/Gait assistance: Min assist Gait Distance (Feet): 25 Feet Assistive device: Rolling walker (2 wheeled) Gait Pattern/deviations: Decreased stride length;Decreased stance time - right;Decreased step length - left;Decreased step length - right Gait velocity: decreased   General Gait Details: gait distances were limited by rib pain and pt fatigue. Ambulated in room only per pt request.       Balance Overall balance assessment: Needs assistance Sitting-balance support: Feet unsupported;No upper extremity supported Sitting balance-Leahy Scale: Fair     Standing balance support: Bilateral upper extremity supported;During functional activity Standing balance-Leahy Scale: Fair Standing balance comment: reliant on RW for support         Cognition Arousal/Alertness: Awake/alert Behavior During Therapy: WFL for tasks assessed/performed Overall Cognitive Status: Within Functional Limits for tasks assessed        General Comments: Pt is A and O x 4 and able to follow multistep commands without difficulty. session very limited by pain      Exercises Other Exercises Other Exercises: issued coordination and high level dexterity exercises to promote return        Pertinent Vitals/Pain Pain Assessment: 0-10 Pain Score: 8  Pain Location: R posterior rib/back Pain Descriptors / Indicators: Sore Pain Intervention(s): Limited activity within patient's tolerance;Monitored during session;Repositioned           PT Goals (current goals can now be found in the care plan section) Acute Rehab PT Goals Patient  Stated Goal: get better so I can go home Progress towards PT goals: Progressing toward goals    Frequency    7X/week      PT Plan Current plan remains appropriate       AM-PAC PT "6 Clicks" Mobility   Outcome Measure  Help needed turning from your back to your side while in a flat bed without using bedrails?: A Little Help needed moving from lying on your back to sitting on the side of a flat bed without using bedrails?: A Little Help needed moving to and from a bed to a chair (including a wheelchair)?: A Little Help needed standing up from a chair using your arms (e.g., wheelchair or bedside chair)?: A Little Help needed to walk in hospital room?: A Little Help needed climbing 3-5 steps with a railing? : A Lot 6 Click Score: 17    End of Session Equipment Utilized During Treatment: Gait belt Activity Tolerance: Patient limited by fatigue Patient left: in chair;with call bell/phone within reach;with chair alarm set Nurse Communication: Mobility status;Patient requests pain meds PT Visit Diagnosis: Unsteadiness on feet (R26.81);Other abnormalities of gait and mobility (R26.89);Muscle weakness (generalized) (M62.81);Difficulty in walking, not elsewhere classified (R26.2);Other symptoms and signs involving the nervous system (R29.898);Hemiplegia and hemiparesis Hemiplegia - Right/Left: Right Hemiplegia - dominant/non-dominant: Dominant Hemiplegia - caused by: Cerebral infarction     Time: 8453-6468 PT Time Calculation (min) (ACUTE ONLY): 25 min  Charges:  $Therapeutic Activity: 8-22 mins $Neuromuscular Re-education: 8-22 mins                     Julaine Fusi PTA 10/20/19, 12:07 PM

## 2019-10-20 NOTE — Care Management Important Message (Signed)
Important Message  Patient Details  Name: Joyce Maynard MRN: [id number] Date of Birth: May 10, 1940   Medicare Important Message Given:  N/A - LOS <3 / Initial given by admissions  Initial Medicare IM reviewed with patient by Meredith Mody, Patient Access Associate on 10/19/2019 at 11:33am.   [name] 10/20/2019, 8:47 AM

## 2019-10-20 NOTE — Discharge Summary (Signed)
PROGRESS NOTE  Joyce Maynard NOB:[id number] DOB: 11/28/1940 DOA: 10/17/2019 PCP: Einar Pheasant, MD  HPI/Recap of past 24 hours:  Joyce Maynard is a 79 y.o. female with medical history significant of hypertension, hyperlipidemia, diabetes mellitus, GERD, hypothyroidism, depression with anxiety, vertigo, breast cancer (s/p of her lumpectomy, chemo and radiation therapy), polio, CKD stage III, who presents with left-sided weakness and numbness.  Patient states that she has been having right-sided weakness and numbness in both arms and the legs for several days.  No slurred speech, vision loss or hearing loss. She also has dizziness.  Denies chest pain, shortness breath, cough, fever or chills. Patient has nausea and vomited several times with nonbilious nonbloody vomiting.  Patient had 1 diarrhea today.  No abdominal pain.  Denies symptoms of UTI. Patient had fall several weeks ago and complains of some back pain  ED Course: pt was found to have troponin level 9 --> 6, WBC 7.6, INR 0.9, PTT 27, pending COVID-19 PCR, renal function close to baseline, temperature normal, blood pressure 113/83, heart rate 58, RR 18, oxygen saturation 99% on room air.  CT head showed age indeterminate lacunar stroke.  MRI of brain showed 10 mm left thalamus stroke.   Admitted for stroke work up.  Neurology/stroke team following.  10/20/19:  RLE weakness persists with some difficulty using her right arm.  PT assessed and recommended SNF.  TOC assisting.  Receptive to her therapies.  Awaiting bed placement.  Assessment/Plan: Principal Problem:   Stroke [facility]) Active Problems:   Essential hypertension, benign   Type II diabetes mellitus with renal manifestations (HCC)   Hypercholesterolemia   Hypothyroidism   GERD (gastroesophageal reflux disease)   Anxiety   Compression fracture of thoracic vertebra (HCC)   Nausea vomiting and diarrhea   Hypokalemia   Acute CVA (cerebrovascular accident) ([place])  Acute CVA  involving Left Thalamus MRI brain showed acute 10 mm left thalamus CVA Neuro stroke team following Admitted for stroke work up LDL 133 goal less that 70 A1C 7.3 goal <7.0 B/L carotid doppler US and 2D echo pending, follow results Monitor on telemetry ASA 81 and plavix 75 mg daily per neuro recs Crestor 20 mg daily at home, dose increased to 40 mg daily PT OT recommend SNF-TOC consulted for SNF placement Passed speech swallow eval Continue PT OT with assistance and fall precautions. Follow TTE results.  HLD LDL not at goal Management per above  DM2 with hyperglycemia A1C not at goal Management per above   Essential HTN Allow for permissive HTN Hold off home Toprol Xl  Chronic anxiety/depression Resume home regimen  Uncontrolled hypothyroidism likely due to medication noncompliance TSH 44 on 10/17/19 Likely noncompliance, will obtain free T4 and continue home levothyroxine 175 mcg daily. Will need to repeat TSH in 4 to 6 weeks when acute illness has resolved. Closely follow-up Free T4 results for possible change in management.  Ambulatory dysfunction in the setting of acute stroke Continue PT OT with assistance Supervision 24 H    Code Status: Full   Family Communication: Updated her son via phone  Disposition Plan: Home or SNF after stroke workup is completed   Consultants:  Neurology  Procedures:  TTE   Antimicrobials:  None   DVT prophylaxis:  SQ Lovenox daily     Objective: Vitals:   10/19/19 1959 10/20/19 0001 10/20/19 0418 10/20/19 0723  BP: 136/72 128/64 126/83 131/74  Pulse: 67 80 (!) 55 (!) 56  Resp: 17 17 18  17  Temp: 98.7 F (37.1 C) 98.8 F (37.1 C) 98 F (36.7 C) 97.8 F (36.6 C)  TempSrc: Oral   Axillary  SpO2: 100% 98% 98% 99%  Weight:      Height:        Intake/Output Summary (Last 24 hours) at 10/20/2019 1516 Last data filed at 10/19/2019 1822 Gross per 24 hour  Intake --  Output 300 ml  Net -300 ml   Filed  Weights   10/17/19 0925  Weight: 93 kg    Exam: No changes from previous exam.  . General: 79 y.o. year-old female Well developed well nourished in NAD.  A&O x 3 . Cardiovascular: RRR no rubs or gallops . Respiratory: CTA no wheezes or rales . Abdomen: Soft NT ND NBS . Musculoskeletal: No LE edema. 2/4 pulses in all 4 extremities. RLE 4/5 strength, mild R hand dysmetria with finger to nose. Marland Kitchen Psychiatry: Mood is appropriate   Data Reviewed: CBC: Recent Labs  Lab 10/17/19 0928  WBC 7.6  NEUTROABS 5.6  HGB 14.5  HCT 42.3  MCV 101.9*  PLT 979   Basic Metabolic Panel: Recent Labs  Lab 10/17/19 0928 10/19/19 0307  NA 140 140  K 3.3* 4.7  CL 105 104  CO2 24 28  GLUCOSE 184* 127*  BUN 16 19  CREATININE 1.12* 1.05*  CALCIUM 9.4 9.1  MG 2.0  --    GFR: Estimated Creatinine Clearance: 50.9 mL/min (A) (by C-G formula based on SCr of 1.05 mg/dL (H)). Liver Function Tests: Recent Labs  Lab 10/17/19 0928  AST 39  ALT 20  ALKPHOS 102  BILITOT 0.9  PROT 8.0  ALBUMIN 4.1   Recent Labs  Lab 10/17/19 0928  LIPASE 20   No results for input(s): AMMONIA in the last 168 hours. Coagulation Profile: Recent Labs  Lab 10/17/19 0928  INR 0.9   Cardiac Enzymes: No results for input(s): CKTOTAL, CKMB, CKMBINDEX, TROPONINI in the last 168 hours. BNP (last 3 results) No results for input(s): PROBNP in the last 8760 hours. HbA1C: No results for input(s): HGBA1C in the last 72 hours. CBG: Recent Labs  Lab 10/19/19 1156 10/19/19 1621 10/19/19 2042 10/20/19 0724 10/20/19 1129  GLUCAP 202* 142* 137* 149* 229*   Lipid Profile: Recent Labs    10/18/19 0416  CHOL 214*  HDL 39*  LDLCALC 133*  TRIG 211*  CHOLHDL 5.5   Thyroid Function Tests: Recent Labs    10/19/19 0307  FREET4 0.91   Anemia Panel: No results for input(s): VITAMINB12, FOLATE, FERRITIN, TIBC, IRON, RETICCTPCT in the last 72 hours. Urine analysis:    Component Value Date/Time   COLORURINE  YELLOW (A) 10/18/2019 0238   APPEARANCEUR CLOUDY (A) 10/18/2019 0238   LABSPEC 1.024 10/18/2019 0238   PHURINE 5.0 10/18/2019 0238   GLUCOSEU NEGATIVE 10/18/2019 0238   GLUCOSEU 100 (A) 09/23/2015 1258   HGBUR SMALL (A) 10/18/2019 0238   BILIRUBINUR NEGATIVE 10/18/2019 0238   KETONESUR NEGATIVE 10/18/2019 0238   PROTEINUR NEGATIVE 10/18/2019 0238   UROBILINOGEN 0.2 09/23/2015 1258   NITRITE NEGATIVE 10/18/2019 0238   LEUKOCYTESUR LARGE (A) 10/18/2019 0238   Sepsis Labs: @LABRCNTIP (procalcitonin:4,lacticidven:4)  ) Recent Results (from the past 240 hour(s))  Respiratory Panel by RT PCR (Flu A&B, Covid) - Nasopharyngeal Swab     Status: None   Collection Time: 10/17/19  1:59 PM   Specimen: Nasopharyngeal Swab  Result Value Ref Range Status   SARS Coronavirus 2 by RT PCR NEGATIVE NEGATIVE Final  Comment: (NOTE) SARS-CoV-2 target nucleic acids are NOT DETECTED.  The SARS-CoV-2 RNA is generally detectable in upper respiratoy specimens during the acute phase of infection. The lowest concentration of SARS-CoV-2 viral copies this assay can detect is 131 copies/mL. A negative result does not preclude SARS-Cov-2 infection and should not be used as the sole basis for treatment or other patient management decisions. A negative result may occur with  improper specimen collection/handling, submission of specimen other than nasopharyngeal swab, presence of viral mutation(s) within the areas targeted by this assay, and inadequate number of viral copies (<131 copies/mL). A negative result must be combined with clinical observations, patient history, and epidemiological information. The expected result is Negative.  Fact Sheet for Patients:  PinkCheek.be  Fact Sheet for Healthcare Providers:  GravelBags.it  This test is no t yet approved or cleared by the [place] FDA and  has been authorized for detection and/or diagnosis  of SARS-CoV-2 by FDA under an Emergency Use Authorization (EUA). This EUA will remain  in effect (meaning this test can be used) for the duration of the COVID-19 declaration under Section 564(b)(1) of the Act, 21 U.S.C. section 360bbb-3(b)(1), unless the authorization is terminated or revoked sooner.     Influenza A by PCR NEGATIVE NEGATIVE Final   Influenza B by PCR NEGATIVE NEGATIVE Final    Comment: (NOTE) The Xpert Xpress SARS-CoV-2/FLU/RSV assay is intended as an aid in  the diagnosis of influenza from Nasopharyngeal swab specimens and  should not be used as a sole basis for treatment. Nasal washings and  aspirates are unacceptable for Xpert Xpress SARS-CoV-2/FLU/RSV  testing.  Fact Sheet for Patients: PinkCheek.be  Fact Sheet for Healthcare Providers: GravelBags.it  This test is not yet approved or cleared by the [place] FDA and  has been authorized for detection and/or diagnosis of SARS-CoV-2 by  FDA under an Emergency Use Authorization (EUA). This EUA will remain  in effect (meaning this test can be used) for the duration of the  Covid-19 declaration under Section 564(b)(1) of the Act, 21  U.S.C. section 360bbb-3(b)(1), unless the authorization is  terminated or revoked. Performed at [facility], [address], [place], [place] [address]       Studies: No results found.  Scheduled Meds: . aspirin EC  81 mg Oral Daily  . clopidogrel  75 mg Oral Daily  . enoxaparin (LOVENOX) injection  0.5 mg/kg Subcutaneous Q24H  . insulin aspart  0-5 Units Subcutaneous QHS  . insulin aspart  0-9 Units Subcutaneous TID WC  . levothyroxine  175 mcg Oral Daily  . metoprolol succinate  25 mg Oral Daily  . rosuvastatin  40 mg Oral Daily    Continuous Infusions:   LOS: 2 days     Kayleen Memos, MD [facility] Pager [phone]  If 7PM-7AM, please contact  night-coverage [email or web address] Password [email or web address] 10/20/2019, 3:16 PM

## 2019-10-20 NOTE — Plan of Care (Signed)
Pt does not seems to have any obvious deficit at this time. Pt uses BSC. Will continue to monitor.

## 2019-10-21 DIAGNOSIS — I10 Essential (primary) hypertension: Secondary | ICD-10-CM | POA: Diagnosis not present

## 2019-10-21 LAB — GLUCOSE, CAPILLARY
Glucose-Capillary: 153 mg/dL — ABNORMAL HIGH (ref 70–99)
Glucose-Capillary: 226 mg/dL — ABNORMAL HIGH (ref 70–99)
Glucose-Capillary: 202 mg/dL — ABNORMAL HIGH (ref 70–99)
Glucose-Capillary: 143 mg/dL — ABNORMAL HIGH (ref 70–99)

## 2019-10-21 MED ORDER — VENLAFAXINE HCL ER 150 MG PO CP24
150.0000 mg | ORAL_CAPSULE | Freq: Every day | ORAL | Status: DC
  Administered 2019-10-21 – 2019-10-23 (×3): 150 mg via ORAL
  Filled 2019-10-21 (×3): qty 1

## 2019-10-21 NOTE — Plan of Care (Signed)
No changes in pt this shift. Will continue to monitor.

## 2019-10-21 NOTE — Progress Notes (Signed)
Physical Therapy Treatment Patient Details Name: Joyce Maynard MRN: [id number] DOB: 14-Dec-1940 Today's Date: 10/21/2019    History of Present Illness Pt is a 79 y/o F who presented to hospital on 10/17/2019 with chief complaint of R side weakness & numbness & vomiting for several days. 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 Pt also seein in ED 3 weeks ago after fall & sustaining rib fx with chest & back pain. Pt with PMH significant for HTN, HLD, DM, depression with anxiety, vertigo, breast CA (s/p lumpectomy with chemo & radiation), polio, CKD stage 3.    PT Comments    Pt much less tearful this afternoon than earlier in day. She was pre-medicated for pain prior to session 2/2 to c/o rib pain. Pt was sitting at EOB with RN tech in room upon author arriving. She agrees to PT session and is cooperative an pleasant throughout.  Required min assist to stand from slightly elevated bed height. Ambulated ~ 50 ft with RW with slow antalgic gait pattern. Pt continues to have pain from previous rib fx. Gait distances limited by pain and fatigue. She returned to her room and was repositioned in recliner. Overall tolerated session well. Recommend SNF at DC if pt is unable to have 24 hour assistance at home. If able to have 24 hour assistance, recommend DC home with HHPT to follow. She was in recliner with chair alarm in place and call bell in reach at conclusion of session.    Follow Up Recommendations  SNF;Home health PT (SNF if unable to have 24 hour assistance at home)     Equipment Recommendations  Other (comment) (defer to next level of care')    Recommendations for Other Services       Precautions / Restrictions Precautions Precautions: Fall Restrictions Weight Bearing Restrictions: No    Mobility  Bed Mobility               General bed mobility comments: was seated EOB upon arriving with RN tech in room. sitting in recliner at conclsuion of session  Transfers Overall transfer  level: Needs assistance Equipment used: Rolling walker (2 wheeled) Transfers: Sit to/from Stand Sit to Stand: Min assist         General transfer comment: min assist + vcs for hand placement, fwd wt shift, and proper posture once stnding  Ambulation/Gait Ambulation/Gait assistance: Min assist;Min guard Gait Distance (Feet): 60 Feet Assistive device: Rolling walker (2 wheeled) Gait Pattern/deviations: Decreased stride length;Decreased step length - left;Decreased step length - right Gait velocity: decreased   General Gait Details: pt was able to ambulate out to RN station and return without LOB. slow antalgic gait pattern 2/2 to rib pain. HR elevated to 120bpm only. Overall demonstarted improve activity tolerance with less pain during gait          Balance Overall balance assessment: Needs assistance Sitting-balance support: Feet supported Sitting balance-Leahy Scale: Good Sitting balance - Comments: no LOB in sititng   Standing balance support: Bilateral upper extremity supported;During functional activity Standing balance-Leahy Scale: Fair Standing balance comment: reliant on RW for support         Cognition Arousal/Alertness: Awake/alert Behavior During Therapy: WFL for tasks assessed/performed Overall Cognitive Status: Within Functional Limits for tasks assessed      General Comments: Pt has been issued anti-depressant that she was taking prior to admission. Pt feels better this afternoon and was agreeable to PT session. did need to be pre-medicated 2/2 to rib pain.  Pertinent Vitals/Pain Pain Assessment: 0-10 Pain Score: 8  Pain Location: R posterior rib/back Pain Descriptors / Indicators: Sore Pain Intervention(s): Limited activity within patient's tolerance;Monitored during session;Premedicated before session;Repositioned           PT Goals (current goals can now be found in the care plan section) Acute Rehab PT Goals Patient Stated Goal:  get better so I can go home Progress towards PT goals: Progressing toward goals    Frequency    7X/week      PT Plan Current plan remains appropriate       AM-PAC PT "6 Clicks" Mobility   Outcome Measure  Help needed turning from your back to your side while in a flat bed without using bedrails?: A Little Help needed moving from lying on your back to sitting on the side of a flat bed without using bedrails?: A Little Help needed moving to and from a bed to a chair (including a wheelchair)?: A Little Help needed standing up from a chair using your arms (e.g., wheelchair or bedside chair)?: A Little Help needed to walk in hospital room?: A Little Help needed climbing 3-5 steps with a railing? : A Lot 6 Click Score: 17    End of Session Equipment Utilized During Treatment: Gait belt Activity Tolerance: Patient tolerated treatment well Patient left: in chair;with call bell/phone within reach;with chair alarm set Nurse Communication: Mobility status PT Visit Diagnosis: Unsteadiness on feet (R26.81);Other abnormalities of gait and mobility (R26.89);Muscle weakness (generalized) (M62.81);Difficulty in walking, not elsewhere classified (R26.2);Other symptoms and signs involving the nervous system (R29.898);Hemiplegia and hemiparesis Hemiplegia - Right/Left: Right Hemiplegia - dominant/non-dominant: Dominant Hemiplegia - caused by: Cerebral infarction     Time: 1536-1550 PT Time Calculation (min) (ACUTE ONLY): 14 min  Charges:  $Gait Training: 8-22 mins                     Julaine Fusi PTA 10/21/19, 4:20 PM

## 2019-10-21 NOTE — Progress Notes (Signed)
PT Cancellation Note  Patient Details Name: Joyce Maynard MRN: [id number] DOB: [birth year]   Cancelled Treatment:     PT attempt. Upon entering room and encouraging pt to participate in PT, she begins crying and states she just is not feeling it today. Said she takes medicine for depression at home but has not been taking here. She politely refused PT at this time. Pt has been very motivated previous few days.  DO messaged and is reaching out to pharmacy to confirm medicine PTA. PT will continue efforts to progress pt as able per POC. Will return at later time/date when pt is more appropriate to participate.      Willette Pa 10/21/2019, 12:56 PM

## 2019-10-21 NOTE — Progress Notes (Signed)
PROGRESS NOTE  Joyce Maynard:[id number] DOB: 09-19-1940 DOA: 10/17/2019 PCP: Joyce Pheasant, MD  HPI/Recap of past 24 hours:  Joyce Maynard is a 79 y.o. female with medical history significant of hypertension, hyperlipidemia, diabetes mellitus, GERD, hypothyroidism, depression with anxiety, vertigo, breast cancer (s/p of her lumpectomy, chemo and radiation therapy), polio, CKD stage III, who presents with left-sided weakness and numbness.  Patient states that she has been having right-sided weakness and numbness in both arms and the legs for several days.  No slurred speech, vision loss or hearing loss. She also has dizziness.  Denies chest pain, shortness breath, cough, fever or chills. Patient has nausea and vomited several times with nonbilious nonbloody vomiting.  Patient had 1 diarrhea today.  No abdominal pain.  Denies symptoms of UTI. Patient had fall several weeks ago and complains of some back pain  ED Course: pt was found to have troponin level 9 --> 6, WBC 7.6, INR 0.9, PTT 27, pending COVID-19 PCR, renal function close to baseline, temperature normal, blood pressure 113/83, heart rate 58, RR 18, oxygen saturation 99% on room air.  CT head showed age indeterminate lacunar stroke.  MRI of brain showed 10 mm left thalamus stroke.   Admitted for stroke work up.  Neurology/stroke team following.  10/21/19:  Feels down this AM.  Confirmed with her son that she is on home Effexor, restarted.  Will continue to encourage medication compliance.  She has been receptive.  Assessment/Plan: Principal Problem:   Stroke [facility]) Active Problems:   Essential hypertension, benign   Type II diabetes mellitus with renal manifestations (HCC)   Hypercholesterolemia   Hypothyroidism   GERD (gastroesophageal reflux disease)   Anxiety   Compression fracture of thoracic vertebra (HCC)   Nausea vomiting and diarrhea   Hypokalemia   Acute CVA (cerebrovascular accident) ([place])  Acute CVA involving  Left Thalamus MRI brain showed acute 10 mm left thalamus CVA Neuro stroke team following Admitted for stroke work up LDL 133 goal less that 70 A1C 7.3 goal <7.0 B/L carotid doppler US and 2D echo pending, follow results Monitor on telemetry ASA 81 and plavix 75 mg daily per neuro recs Crestor 20 mg daily at home, dose increased to 40 mg daily PT OT recommend SNF-TOC consulted for SNF placement Passed speech swallow eval Continue PT OT with assistance and fall precautions. Follow TTE results.  HLD LDL not at goal Management per above  DM2 with hyperglycemia A1C not at goal Management per above   Essential HTN Allow for permissive HTN Hold off home Toprol Xl  Chronic anxiety/depression Resume home regimen On Effexor daily and ativan PRN PTA, confirmed by her son who assists her with her medications.  Uncontrolled hypothyroidism likely due to medication noncompliance TSH 44 on 10/17/19 Likely noncompliance, will obtain free T4 and continue home levothyroxine 175 mcg daily. Will need to repeat TSH in 4 to 6 weeks when acute illness has resolved. Closely follow-up Free T4 results for possible change in management.  Ambulatory dysfunction in the setting of acute stroke Continue PT OT with assistance Supervision 24 H    Code Status: Full   Family Communication: Updated her son via phone 10/21/19  Disposition Plan: Home or SNF after stroke workup is completed   Consultants:  Neurology  Procedures:  TTE   Antimicrobials:  None   DVT prophylaxis:  SQ Lovenox daily     Objective: Vitals:   10/20/19 2354 10/21/19 0409 10/21/19 0725 10/21/19 1155  BP: Marland Kitchen)  158/83 (!) 150/80 (!) 153/73 (!) 146/76  Pulse: 67 80 61 82  Resp: 18 18 16 18   Temp: 98.8 F (37.1 C) 98.9 F (37.2 C) 98.4 F (36.9 C) 98.8 F (37.1 C)  TempSrc: Oral Oral Oral Oral  SpO2: 98% 97% 98% 100%  Weight:      Height:        Intake/Output Summary (Last 24 hours) at 10/21/2019  1319 Last data filed at 10/21/2019 1021 Gross per 24 hour  Intake 240 ml  Output 950 ml  Net -710 ml   Filed Weights   10/17/19 0925  Weight: 93 kg    Exam: . General: 79 y.o. year-old female well-developed well-nourished no acute distress.  Alert and oriented x3.   . Cardiovascular: Regular rate and rhythm no rubs or gallops.   Marland Kitchen Respiratory: Clear to auscultation no wheezes or rales . Abdomen: Soft NT ND NBS x 4 . Musculoskeletal: No LE edema bilaterally . Psychiatry: Mood is appropriate   Data Reviewed: CBC: Recent Labs  Lab 10/17/19 0928  WBC 7.6  NEUTROABS 5.6  HGB 14.5  HCT 42.3  MCV 101.9*  PLT 786   Basic Metabolic Panel: Recent Labs  Lab 10/17/19 0928 10/19/19 0307  NA 140 140  K 3.3* 4.7  CL 105 104  CO2 24 28  GLUCOSE 184* 127*  BUN 16 19  CREATININE 1.12* 1.05*  CALCIUM 9.4 9.1  MG 2.0  --    GFR: Estimated Creatinine Clearance: 50.9 mL/min (A) (by C-G formula based on SCr of 1.05 mg/dL (H)). Liver Function Tests: Recent Labs  Lab 10/17/19 0928  AST 39  ALT 20  ALKPHOS 102  BILITOT 0.9  PROT 8.0  ALBUMIN 4.1   Recent Labs  Lab 10/17/19 0928  LIPASE 20   No results for input(s): AMMONIA in the last 168 hours. Coagulation Profile: Recent Labs  Lab 10/17/19 0928  INR 0.9   Cardiac Enzymes: No results for input(s): CKTOTAL, CKMB, CKMBINDEX, TROPONINI in the last 168 hours. BNP (last 3 results) No results for input(s): PROBNP in the last 8760 hours. HbA1C: No results for input(s): HGBA1C in the last 72 hours. CBG: Recent Labs  Lab 10/20/19 1129 10/20/19 1629 10/20/19 2040 10/21/19 0727 10/21/19 1154  GLUCAP 229* 142* 140* 153* 226*   Lipid Profile: No results for input(s): CHOL, HDL, LDLCALC, TRIG, CHOLHDL, LDLDIRECT in the last 72 hours. Thyroid Function Tests: Recent Labs    10/19/19 0307  FREET4 0.91   Anemia Panel: No results for input(s): VITAMINB12, FOLATE, FERRITIN, TIBC, IRON, RETICCTPCT in the last 72  hours. Urine analysis:    Component Value Date/Time   COLORURINE YELLOW (A) 10/18/2019 0238   APPEARANCEUR CLOUDY (A) 10/18/2019 0238   LABSPEC 1.024 10/18/2019 0238   PHURINE 5.0 10/18/2019 0238   GLUCOSEU NEGATIVE 10/18/2019 0238   GLUCOSEU 100 (A) 09/23/2015 1258   HGBUR SMALL (A) 10/18/2019 0238   BILIRUBINUR NEGATIVE 10/18/2019 0238   KETONESUR NEGATIVE 10/18/2019 0238   PROTEINUR NEGATIVE 10/18/2019 0238   UROBILINOGEN 0.2 09/23/2015 1258   NITRITE NEGATIVE 10/18/2019 0238   LEUKOCYTESUR LARGE (A) 10/18/2019 0238   Sepsis Labs: @LABRCNTIP (procalcitonin:4,lacticidven:4)  ) Recent Results (from the past 240 hour(s))  Respiratory Panel by RT PCR (Flu A&B, Covid) - Nasopharyngeal Swab     Status: None   Collection Time: 10/17/19  1:59 PM   Specimen: Nasopharyngeal Swab  Result Value Ref Range Status   SARS Coronavirus 2 by RT PCR NEGATIVE NEGATIVE Final  Comment: (NOTE) SARS-CoV-2 target nucleic acids are NOT DETECTED.  The SARS-CoV-2 RNA is generally detectable in upper respiratoy specimens during the acute phase of infection. The lowest concentration of SARS-CoV-2 viral copies this assay can detect is 131 copies/mL. A negative result does not preclude SARS-Cov-2 infection and should not be used as the sole basis for treatment or other patient management decisions. A negative result may occur with  improper specimen collection/handling, submission of specimen other than nasopharyngeal swab, presence of viral mutation(s) within the areas targeted by this assay, and inadequate number of viral copies (<131 copies/mL). A negative result must be combined with clinical observations, patient history, and epidemiological information. The expected result is Negative.  Fact Sheet for Patients:  PinkCheek.be  Fact Sheet for Healthcare Providers:  GravelBags.it  This test is no t yet approved or cleared by the [place] FDA and  has been authorized for detection and/or diagnosis of SARS-CoV-2 by FDA under an Emergency Use Authorization (EUA). This EUA will remain  in effect (meaning this test can be used) for the duration of the COVID-19 declaration under Section 564(b)(1) of the Act, 21 U.S.C. section 360bbb-3(b)(1), unless the authorization is terminated or revoked sooner.     Influenza A by PCR NEGATIVE NEGATIVE Final   Influenza B by PCR NEGATIVE NEGATIVE Final    Comment: (NOTE) The Xpert Xpress SARS-CoV-2/FLU/RSV assay is intended as an aid in  the diagnosis of influenza from Nasopharyngeal swab specimens and  should not be used as a sole basis for treatment. Nasal washings and  aspirates are unacceptable for Xpert Xpress SARS-CoV-2/FLU/RSV  testing.  Fact Sheet for Patients: PinkCheek.be  Fact Sheet for Healthcare Providers: GravelBags.it  This test is not yet approved or cleared by the [place] FDA and  has been authorized for detection and/or diagnosis of SARS-CoV-2 by  FDA under an Emergency Use Authorization (EUA). This EUA will remain  in effect (meaning this test can be used) for the duration of the  Covid-19 declaration under Section 564(b)(1) of the Act, 21  U.S.C. section 360bbb-3(b)(1), unless the authorization is  terminated or revoked. Performed at [facility], [address], [place], [place] [address]       Studies: No results found.  Scheduled Meds: . aspirin EC  81 mg Oral Daily  . clopidogrel  75 mg Oral Daily  . enoxaparin (LOVENOX) injection  0.5 mg/kg Subcutaneous Q24H  . insulin aspart  0-5 Units Subcutaneous QHS  . insulin aspart  0-9 Units Subcutaneous TID WC  . levothyroxine  175 mcg Oral Daily  . metoprolol succinate  25 mg Oral Daily  . rosuvastatin  40 mg Oral Daily  . venlafaxine XR  150 mg Oral Daily    Continuous Infusions:   LOS: 3 days     Kayleen Memos,  MD [facility] Pager [phone]  If 7PM-7AM, please contact night-coverage [email or web address] Password [email or web address] 10/21/2019, 1:19 PM

## 2019-10-22 DIAGNOSIS — I10 Essential (primary) hypertension: Secondary | ICD-10-CM | POA: Diagnosis not present

## 2019-10-22 LAB — GLUCOSE, CAPILLARY
Glucose-Capillary: 131 mg/dL — ABNORMAL HIGH (ref 70–99)
Glucose-Capillary: 258 mg/dL — ABNORMAL HIGH (ref 70–99)
Glucose-Capillary: 207 mg/dL — ABNORMAL HIGH (ref 70–99)
Glucose-Capillary: 186 mg/dL — ABNORMAL HIGH (ref 70–99)

## 2019-10-22 NOTE — Progress Notes (Signed)
Occupational Therapy Treatment Patient Details Name: Joyce Maynard MRN: [id number] DOB: 07/11/1940 Today's Date: 10/22/2019    History of present illness Pt is a 79 y/o F who presented to hospital on 10/17/2019 with chief complaint of R side weakness & numbness & vomiting for several days. 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 Pt also seein in ED 3 weeks ago after fall & sustaining rib fx with chest & back pain. Pt with PMH significant for HTN, HLD, DM, depression with anxiety, vertigo, breast CA (s/p lumpectomy with chemo & radiation), polio, CKD stage 3.   OT comments  Joyce Maynard was seen for OT treatment on this date. Upon arrival to room pt seated EOC finishing meal - pt states increased time/difficulty eating meal. OT discussed d/c plans c pt, she reports that she cannot currently care for her husband at home and although her children are able to care for him, they cannot currently handle caring for her as well. She is agreeable to STR. Pt instructed in RUE HEP: fist pumps using squeeze ball (provided), overhead press, horizontal shoulder AB/ADduction, and supination/pronation. Pt return demonstrated instruction provided. Pt making good progress toward goals. Pt continues to benefit from skilled OT services to maximize return to PLOF and minimize risk of future falls, injury, caregiver burden, and readmission. Will continue to follow POC. Discharge recommendation updated to SNF 2/2 lack of supervision/assist if d/c home.    Follow Up Recommendations  SNF    Equipment Recommendations  3 in 1 bedside commode    Recommendations for Other Services      Precautions / Restrictions Precautions Precautions: Fall Restrictions Weight Bearing Restrictions: No       Mobility Bed Mobility    General bed mobility comments: pt received and left up in chair  Transfers      General transfer comment: pt deferred citing fatigue     Balance Overall balance assessment: Needs  assistance Sitting-balance support: Feet supported Sitting balance-Leahy Scale: Good        ADL either performed or assessed with clinical judgement   ADL Overall ADL's : Needs assistance/impaired      General ADL Comments: SETUP & SUPERVISION self-feeding seated EOC               Cognition Arousal/Alertness: Awake/alert Behavior During Therapy: WFL for tasks assessed/performed Overall Cognitive Status: Within Functional Limits for tasks assessed                 Exercises Exercises: Other exercises Other Exercises Other Exercises: Pt educated re: d/c recs, falls prevention, RUE HEP (fist pumps using squeeze ball (provided), overhead press, horizontal shoulder AB/ADduction, supination/pronation).            Pertinent Vitals/ Pain       Pain Assessment: No/denies pain         Frequency  Min 2X/week        Progress Toward Goals  OT Goals(current goals can now be found in the care plan section)  Progress towards OT goals: Progressing toward goals  Acute Rehab OT Goals Patient Stated Goal: get better so I can go home OT Goal Formulation: With patient/family Time For Goal Achievement: 11/01/19 Potential to Achieve Goals: Good ADL Goals Pt Will Perform Grooming: sitting;with modified independence Pt Will Perform Lower Body Dressing: sit to/from stand;with supervision;with set-up;with caregiver independent in assisting (c LRAD PRN for improved safety and functional indep.) Pt Will Transfer to Toilet: bedside commode;with modified independence (c LRAD PRN for improved safety  and functional indep.) Pt Will Perform Toileting - Clothing Manipulation and hygiene: with adaptive equipment;with set-up;with supervision;sit to/from stand (c LRAD PRN for improved safety and functional indep.)  Plan Discharge plan needs to be updated;Frequency remains appropriate       AM-PAC OT "6 Clicks" Daily Activity     Outcome Measure   Help from another person eating meals?: A  Little Help from another person taking care of personal grooming?: A Little Help from another person toileting, which includes using toliet, bedpan, or urinal?: A Little Help from another person bathing (including washing, rinsing, drying)?: A Little Help from another person to put on and taking off regular upper body clothing?: A Little Help from another person to put on and taking off regular lower body clothing?: A Little 6 Click Score: 18    End of Session    OT Visit Diagnosis: Other abnormalities of gait and mobility (R26.89);Muscle weakness (generalized) (M62.81);Hemiplegia and hemiparesis;Pain Hemiplegia - Right/Left: Right Hemiplegia - dominant/non-dominant: Dominant Hemiplegia - caused by: Cerebral infarction Pain - Right/Left: Right Pain - part of body:  (posterior rib pain)   Activity Tolerance Patient tolerated treatment well;Patient limited by fatigue   Patient Left in chair;with call bell/phone within reach;with nursing/sitter in room (NT in room end of session)   Nurse Communication          Time: 5465-0354 OT Time Calculation (min): 15 min  Charges: OT General Charges $OT Visit: 1 Visit OT Treatments $Self Care/Home Management : 8-22 mins  Dessie Coma, M.S. OTR/L  10/22/19, 3:59 PM  ascom [phone]

## 2019-10-22 NOTE — Progress Notes (Signed)
PROGRESS NOTE  Joyce Maynard GPQ:[id number] DOB: May 13, 1940 DOA: 10/17/2019 PCP: Joyce Pheasant, MD  HPI/Recap of past 24 hours:  Joyce Maynard is a 79 y.o. female with medical history significant of  medication noncompliance, hypertension, hyperlipidemia, type II diabetes mellitus, GERD, hypothyroidism, chronic depression/anxiety, vertigo, breast cancer (s/p of her lumpectomy, chemo and radiation therapy), polio, CKD stage III, who presents with right-sided weakness and numbness for several days.  No slurred speech, vision loss or hearing loss.  Denies chest pain, shortness breath, cough, fever or chills. Patient has nausea and vomited several times with nonbilious nonbloody vomiting, with one episode of loose stool.  No abdominal pain.  Denies symptoms of UTI. Patient had fall several weeks ago and complains of some back pain.  ED Course: pt was found to have troponin level 9 --> 6, WBC 7.6K, INR 0.9, PTT 27, COVID-19 viral screening test negative, renal function close to baseline, temperature normal, blood pressure 113/83, heart rate 58, RR 18, oxygen saturation 99% on room air.    CT head showed age indeterminate lacunar stroke.  MRI of brain showed 10 mm left thalamus stroke.   Admitted for stroke work up.  Neurology/stroke team followed.  10/22/19: Seen and examined at bedside.  She has no new complaints.  She is awaiting bed placement.  Seen by PT with recommendation for SNF.  Continue PT OT as tolerated.  Assessment/Plan: Principal Problem:   Stroke [facility]) Active Problems:   Essential hypertension, benign   Type II diabetes mellitus with renal manifestations (HCC)   Hypercholesterolemia   Hypothyroidism   GERD (gastroesophageal reflux disease)   Anxiety   Compression fracture of thoracic vertebra (HCC)   Nausea vomiting and diarrhea   Hypokalemia   Acute CVA (cerebrovascular accident) ([place])  Acute CVA involving Left Thalamus MRI brain showed acute 10 mm left thalamus  CVA Seen by neuro stroke team Admitted for stroke work up LDL 133 goal less that 70 A1C 7.3 goal <7.0 B/L carotid doppler US, less than 50% stenosis bilaterally.   2D echo normal LVEF 50 to 55% with no wall motion abnormalities ASA 81 and plavix 75 mg daily per neuro recs Crestor 20 mg daily at home, dose increased to 40 mg daily PT OT recommend SNF-TOC consulted to assist with SNF placement. Passed speech swallow eval Continue PT OT with assistance and fall precautions.  HLD LDL 133, not at goal Management per above  DM2 with hyperglycemia A1C 7.3, not at goal Management per above   Essential HTN BP at goal Continue home Toprol-XL 25 mg daily.  Chronic anxiety/depression Continue home regimen Continue Effexor daily and ativan PRN PTA, confirmed by her son who assists her with her medications.  Uncontrolled hypothyroidism likely due to medication noncompliance TSH 44 on 10/17/19, Free T4 0.91 Likely noncompliance, continue home levothyroxine 175 mcg daily. Will need to repeat TSH in 4 to 6 weeks when acute illness has resolved.  Ambulatory dysfunction in the setting of acute stroke Continue PT OT with assistance Fall Precautions    Code Status: Full   Family Communication: Updated her son via phone 10/21/19  Disposition Plan: Home or SNF after stroke workup is completed   Consultants:  Neurology  Procedures:  TTE   Antimicrobials:  None   DVT prophylaxis:  SQ Lovenox daily     Objective: Vitals:   10/21/19 2350 10/22/19 0445 10/22/19 0744 10/22/19 1110  BP: 122/71 (!) 151/86 (!) 141/63 (!) 125/55  Pulse: 80 82 79 62  Resp: 16 15 16 17   Temp: 98.2 F (36.8 C) 98.3 F (36.8 C) 98.1 F (36.7 C) 98.1 F (36.7 C)  TempSrc: Oral     SpO2: 97% 98% 96% 97%  Weight:      Height:        Intake/Output Summary (Last 24 hours) at 10/22/2019 1322 Last data filed at 10/22/2019 1016 Gross per 24 hour  Intake 360 ml  Output 650 ml  Net -290 ml    Filed Weights   10/17/19 0925  Weight: 93 kg    Exam: . General: 79 y.o. year-old female well-developed well-nourished no acute distress.  Alert and winded x3.   . Cardiovascular: Regular rate and rhythm no rubs or gallops.   Marland Kitchen Respiratory: Clear to auscultation no wheezes or rales.   . Abdomen: Soft nontender normal bowel sounds present. . Musculoskeletal: No lower extremity edema bilaterally . Psychiatry: Mood is appropriate for condition and setting.   Data Reviewed: CBC: Recent Labs  Lab 10/17/19 0928  WBC 7.6  NEUTROABS 5.6  HGB 14.5  HCT 42.3  MCV 101.9*  PLT 836   Basic Metabolic Panel: Recent Labs  Lab 10/17/19 0928 10/19/19 0307  NA 140 140  K 3.3* 4.7  CL 105 104  CO2 24 28  GLUCOSE 184* 127*  BUN 16 19  CREATININE 1.12* 1.05*  CALCIUM 9.4 9.1  MG 2.0  --    GFR: Estimated Creatinine Clearance: 50.9 mL/min (A) (by C-G formula based on SCr of 1.05 mg/dL (H)). Liver Function Tests: Recent Labs  Lab 10/17/19 0928  AST 39  ALT 20  ALKPHOS 102  BILITOT 0.9  PROT 8.0  ALBUMIN 4.1   Recent Labs  Lab 10/17/19 0928  LIPASE 20   No results for input(s): AMMONIA in the last 168 hours. Coagulation Profile: Recent Labs  Lab 10/17/19 0928  INR 0.9   Cardiac Enzymes: No results for input(s): CKTOTAL, CKMB, CKMBINDEX, TROPONINI in the last 168 hours. BNP (last 3 results) No results for input(s): PROBNP in the last 8760 hours. HbA1C: No results for input(s): HGBA1C in the last 72 hours. CBG: Recent Labs  Lab 10/21/19 1154 10/21/19 1615 10/21/19 2136 10/22/19 0744 10/22/19 1111  GLUCAP 226* 202* 143* 131* 258*   Lipid Profile: No results for input(s): CHOL, HDL, LDLCALC, TRIG, CHOLHDL, LDLDIRECT in the last 72 hours. Thyroid Function Tests: No results for input(s): TSH, T4TOTAL, FREET4, T3FREE, THYROIDAB in the last 72 hours. Anemia Panel: No results for input(s): VITAMINB12, FOLATE, FERRITIN, TIBC, IRON, RETICCTPCT in the last 72  hours. Urine analysis:    Component Value Date/Time   COLORURINE YELLOW (A) 10/18/2019 0238   APPEARANCEUR CLOUDY (A) 10/18/2019 0238   LABSPEC 1.024 10/18/2019 0238   PHURINE 5.0 10/18/2019 0238   GLUCOSEU NEGATIVE 10/18/2019 0238   GLUCOSEU 100 (A) 09/23/2015 1258   HGBUR SMALL (A) 10/18/2019 0238   BILIRUBINUR NEGATIVE 10/18/2019 0238   KETONESUR NEGATIVE 10/18/2019 0238   PROTEINUR NEGATIVE 10/18/2019 0238   UROBILINOGEN 0.2 09/23/2015 1258   NITRITE NEGATIVE 10/18/2019 0238   LEUKOCYTESUR LARGE (A) 10/18/2019 0238   Sepsis Labs: @LABRCNTIP (procalcitonin:4,lacticidven:4)  ) Recent Results (from the past 240 hour(s))  Respiratory Panel by RT PCR (Flu A&B, Covid) - Nasopharyngeal Swab     Status: None   Collection Time: 10/17/19  1:59 PM   Specimen: Nasopharyngeal Swab  Result Value Ref Range Status   SARS Coronavirus 2 by RT PCR NEGATIVE NEGATIVE Final    Comment: (NOTE) SARS-CoV-2 target  nucleic acids are NOT DETECTED.  The SARS-CoV-2 RNA is generally detectable in upper respiratoy specimens during the acute phase of infection. The lowest concentration of SARS-CoV-2 viral copies this assay can detect is 131 copies/mL. A negative result does not preclude SARS-Cov-2 infection and should not be used as the sole basis for treatment or other patient management decisions. A negative result may occur with  improper specimen collection/handling, submission of specimen other than nasopharyngeal swab, presence of viral mutation(s) within the areas targeted by this assay, and inadequate number of viral copies (<131 copies/mL). A negative result must be combined with clinical observations, patient history, and epidemiological information. The expected result is Negative.  Fact Sheet for Patients:  PinkCheek.be  Fact Sheet for Healthcare Providers:  GravelBags.it  This test is no t yet approved or cleared by the [place] FDA and  has been authorized for detection and/or diagnosis of SARS-CoV-2 by FDA under an Emergency Use Authorization (EUA). This EUA will remain  in effect (meaning this test can be used) for the duration of the COVID-19 declaration under Section 564(b)(1) of the Act, 21 U.S.C. section 360bbb-3(b)(1), unless the authorization is terminated or revoked sooner.     Influenza A by PCR NEGATIVE NEGATIVE Final   Influenza B by PCR NEGATIVE NEGATIVE Final    Comment: (NOTE) The Xpert Xpress SARS-CoV-2/FLU/RSV assay is intended as an aid in  the diagnosis of influenza from Nasopharyngeal swab specimens and  should not be used as a sole basis for treatment. Nasal washings and  aspirates are unacceptable for Xpert Xpress SARS-CoV-2/FLU/RSV  testing.  Fact Sheet for Patients: PinkCheek.be  Fact Sheet for Healthcare Providers: GravelBags.it  This test is not yet approved or cleared by the [place] FDA and  has been authorized for detection and/or diagnosis of SARS-CoV-2 by  FDA under an Emergency Use Authorization (EUA). This EUA will remain  in effect (meaning this test can be used) for the duration of the  Covid-19 declaration under Section 564(b)(1) of the Act, 21  U.S.C. section 360bbb-3(b)(1), unless the authorization is  terminated or revoked. Performed at [facility], [address] [place], [place] [address]       Studies: No results found.  Scheduled Meds: . aspirin EC  81 mg Oral Daily  . clopidogrel  75 mg Oral Daily  . enoxaparin (LOVENOX) injection  0.5 mg/kg Subcutaneous Q24H  . insulin aspart  0-5 Units Subcutaneous QHS  . insulin aspart  0-9 Units Subcutaneous TID WC  . levothyroxine  175 mcg Oral Daily  . metoprolol succinate  25 mg Oral Daily  . rosuvastatin  40 mg Oral Daily  . venlafaxine XR  150 mg Oral Daily    Continuous Infusions:   LOS: 4 days     Kayleen Memos,  MD [facility] Pager [phone]  If 7PM-7AM, please contact night-coverage [email or web address] Password [email or web address] 10/22/2019, 1:22 PM

## 2019-10-22 NOTE — Progress Notes (Signed)
Physical Therapy Treatment Patient Details Name: Joyce Maynard MRN: [id number] DOB: 08/04/1940 Today's Date: 10/22/2019    History of Present Illness Pt is a 79 y/o F who presented to hospital on 10/17/2019 with chief complaint of R side weakness & numbness & vomiting for several days. 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 Pt also seein in ED 3 weeks ago after fall & sustaining rib fx with chest & back pain. Pt with PMH significant for HTN, HLD, DM, depression with anxiety, vertigo, breast CA (s/p lumpectomy with chemo & radiation), polio, CKD stage 3.    PT Comments    In and out of bed with heavy use of rail and HOB slightly elevated.  Steady in sitting.  She is able to stand with min a x 1 to walker but initially hesitant to walk.  With time she is able to walk to nursing desk and back with RW and min a x 1.  She has a very slow caution gait and needs mod verbal cues to advance RLE fully before stepping.  Will drag RLE if not given frequent cues with forward LOB.  She does report feeling more "wobbly" today and fatigued requesting to go back to bed after session.  Discussed discharge plan with pt.  Stated she lives with her husband who had dementia and while he is getting around [place] it does not sound as if he can safely give her the assist she needs or the cues to manage her safely at home.  SNF would be recommended given recent fall and rib fx's to increase overall mobility and safety for a successful discharge home.     Follow Up Recommendations  SNF     Equipment Recommendations       Recommendations for Other Services       Precautions / Restrictions Precautions Precautions: Fall Precaution Comments: R hemi (slight) Restrictions Weight Bearing Restrictions: No    Mobility  Bed Mobility Overal bed mobility: Needs Assistance Bed Mobility: Supine to Sit;Sit to Supine     Supine to sit: Min guard;Supervision Sit to supine: Supervision;Min guard   General bed mobility  comments: heavy use of rails with HOB slightly elevated  Transfers Overall transfer level: Needs assistance Equipment used: Rolling walker (2 wheeled) Transfers: Sit to/from Stand Sit to Stand: Min assist            Ambulation/Gait Ambulation/Gait assistance: Min Web designer (Feet): 60 Feet Assistive device: Rolling walker (2 wheeled) Gait Pattern/deviations: Decreased stride length;Decreased step length - left;Decreased step length - right Gait velocity: decreased   General Gait Details: dragging R LE today with verbal cues to advance fully.  reports feeling "wobbly" today   Stairs             Wheelchair Mobility    Modified Rankin (Stroke Patients Only)       Balance Overall balance assessment: Needs assistance Sitting-balance support: Feet supported Sitting balance-Leahy Scale: Good Sitting balance - Comments: no LOB in sititng   Standing balance support: Bilateral upper extremity supported;During functional activity Standing balance-Leahy Scale: Fair Standing balance comment: reliant on RW for support                            Cognition Arousal/Alertness: Awake/alert Behavior During Therapy: WFL for tasks assessed/performed Overall Cognitive Status: Within Functional Limits for tasks assessed  Exercises      General Comments        Pertinent Vitals/Pain Pain Assessment: No/denies pain    Home Living                      Prior Function            PT Goals (current goals can now be found in the care plan section) Progress towards PT goals: Progressing toward goals    Frequency    7X/week      PT Plan Current plan remains appropriate;Other (comment)    Co-evaluation              AM-PAC PT "6 Clicks" Mobility   Outcome Measure  Help needed turning from your back to your side while in a flat bed without using bedrails?: A Little Help  needed moving from lying on your back to sitting on the side of a flat bed without using bedrails?: A Little Help needed moving to and from a bed to a chair (including a wheelchair)?: A Little Help needed standing up from a chair using your arms (e.g., wheelchair or bedside chair)?: A Little Help needed to walk in hospital room?: A Little Help needed climbing 3-5 steps with a railing? : A Lot 6 Click Score: 17    End of Session Equipment Utilized During Treatment: Gait belt Activity Tolerance: Patient tolerated treatment well;Patient limited by fatigue Patient left: in bed;with call bell/phone within reach;with bed alarm set Nurse Communication: Mobility status Hemiplegia - Right/Left: Right Hemiplegia - dominant/non-dominant: Dominant Hemiplegia - caused by: Cerebral infarction     Time: 1660-6004 PT Time Calculation (min) (ACUTE ONLY): 12 min  Charges:  $Gait Training: 8-22 mins                    Chesley Noon, PTA 10/22/19, 10:11 AM

## 2019-10-23 DIAGNOSIS — I10 Essential (primary) hypertension: Secondary | ICD-10-CM | POA: Diagnosis not present

## 2019-10-23 LAB — RESPIRATORY PANEL BY RT PCR (FLU A&B, COVID)
Influenza A by PCR: NEGATIVE
Influenza B by PCR: NEGATIVE
SARS Coronavirus 2 by RT PCR: NEGATIVE

## 2019-10-23 LAB — GLUCOSE, CAPILLARY
Glucose-Capillary: 134 mg/dL — ABNORMAL HIGH (ref 70–99)
Glucose-Capillary: 200 mg/dL — ABNORMAL HIGH (ref 70–99)

## 2019-10-23 MED ORDER — METOPROLOL SUCCINATE ER 25 MG PO TB24
25.0000 mg | ORAL_TABLET | Freq: Every day | ORAL | 0 refills | Status: DC
Start: 2019-10-24 — End: 2020-03-11

## 2019-10-23 MED ORDER — ROSUVASTATIN CALCIUM 40 MG PO TABS
40.0000 mg | ORAL_TABLET | Freq: Every day | ORAL | 0 refills | Status: DC
Start: 2019-10-24 — End: 2020-02-19

## 2019-10-23 MED ORDER — ASPIRIN EC 81 MG PO TBEC: 81.0000 mg | DELAYED_RELEASE_TABLET | Freq: Every day | ORAL | 0 refills | Status: AC

## 2019-10-23 MED ORDER — CLOPIDOGREL BISULFATE 75 MG PO TABS: 75.0000 mg | ORAL_TABLET | Freq: Every day | ORAL | 0 refills | Status: DC

## 2019-10-23 NOTE — Discharge Summary (Addendum)
Discharge Summary  Joyce Maynard PYP:[id number] DOB: 02/21/1940  PCP: Einar Pheasant, MD  Admit date: 10/17/2019 Discharge date: 10/23/2019  Time spent: 35 minutes  Recommendations for Outpatient Follow-up:  1. Follow-up with neurology 2. Follow-up with your primary care provider 3. Follow-up with hematology oncology  4. Take your medications as prescribed 5. Continue PT OT with assistance and fall precautions  Discharge Diagnoses:  Active Hospital Problems   Diagnosis Date Noted  . Stroke ([place]) 10/17/2019  . Acute CVA (cerebrovascular accident) ([place]) 10/18/2019  . Anxiety 10/17/2019  . Compression fracture of thoracic vertebra ([place]) 10/17/2019  . Nausea vomiting and diarrhea 10/17/2019  . Hypokalemia 10/17/2019  . Essential hypertension, benign 04/18/2012  . Hypercholesterolemia 04/18/2012  . Type II diabetes mellitus with renal manifestations ([place]) 04/18/2012  . Hypothyroidism 04/18/2012  . GERD (gastroesophageal reflux disease) 04/18/2012    Resolved Hospital Problems  No resolved problems to display.    Discharge Condition: Stable  Diet recommendation: Heart healthy carb modified diet.  Vitals:   10/23/19 0422 10/23/19 0732  BP: 138/86 126/85  Pulse: 68 75  Resp: 19 17  Temp: 98.4 F (36.9 C) 97.8 F (36.6 C)  SpO2: 96% 95%    History of present illness:  Joyce Maynard a 79 y.o.femalewith medical history significant formedication noncompliance, essential hypertension, hyperlipidemia, type II diabetes mellitus, GERD, hypothyroidism, chronic depression/anxiety, vertigo, breast cancer (s/p lumpectomy, chemo and radiation therapy), polio, CKD stage III, who presents with right-sided weakness and numbness for several days.No slurred speech, vision loss or hearing loss. Denies chest pain, shortness breath, cough, fever or chills. She had nausea and vomited several times, with one episode of loose stool. No abdominal pain. Denies symptoms of UTI.    ED  Course:Troponin level 9-->6, WBC 7.6K, INR 0.9, PTT 27, COVID-19 viral screening test negative, renal function close to baseline, temperature normal, blood pressure 113/83, heart rate 58, RR 18, oxygen saturation 99% on room air.   CT head showed age indeterminate lacunar stroke. MRI of brain showed 80mm left thalamus stroke.   Admitted for stroke work up.  Neurology/stroke team followed.  PT OT recommended SNF.  TOC was consulted to assist with SNF placement.  10/23/19: Seen and examined at bedside.  She has no new complaints.  She is awaiting bed placement.  Seen by PT with recommendation for SNF.  Continue PT OT as tolerated with assistance and fall precautions.  Hospital Course:  Principal Problem:   Stroke [facility]) Active Problems:   Essential hypertension, benign   Type II diabetes mellitus with renal manifestations (HCC)   Hypercholesterolemia   Hypothyroidism   GERD (gastroesophageal reflux disease)   Anxiety   Compression fracture of thoracic vertebra (HCC)   Nausea vomiting and diarrhea   Hypokalemia   Acute CVA (cerebrovascular accident) ([place])  Acute CVA involving Left Thalamus MRI brain showed acute 10 mm left thalamus CVA Seen by neuro stroke team Admitted for stroke work up LDL 133 goal less that 70 A1C 7.3 goal <7.0 B/L carotid doppler US, less than 50% stenosis bilaterally.   2D echo normal LVEF 50 to 55% with no wall motion abnormalities ASA 81mg  and plavix 75 mg daily per neuro recs Crestor 20 mg daily at home, dose increased to 40 mg daily PT OT recommend SNF-TOC consulted to assist with SNF placement. Passed speech swallow eval Continue PT OT with assistance and fall precautions. Follow-up with neurology  HLD LDL 133, not at goal Management per above  DM2 with hyperglycemia A1C  7.3, not at goal Management per above   Essential HTN BP at goal Continue home Toprol-XL 25 mg daily.  CKD 3A Stable Avoid nephrotoxins   Chronic  anxiety/depression Continue home regimen Continue Effexor daily and ativan PRN PTA, confirmed by her son who assists her with her medications.  Uncontrolled hypothyroidism likely due to medication noncompliance TSH 44 on 10/17/19, Free T4 0.91 Likely noncompliance, continue home levothyroxine 175 mcg daily. Will need to repeat TSH in 4 to 6 weeks when acute illness has resolved. Follow-up with your PCP  Ambulatory dysfunction in the setting of acute stroke Continue PT OT with assistance Fall Precautions    Code Status: Full     Consultants:  Neurology  Procedures:  TTE   Antimicrobials:  None     Discharge Exam: BP 126/85 (BP Location: Left Arm)   Pulse 75   Temp 97.8 F (36.6 C) (Oral)   Resp 17   Ht 5\' 7"  (1.702 m)   Wt 93 kg   SpO2 95%   BMI 32.11 kg/m  . General: 79 y.o. year-old female well developed well nourished in no acute distress.  Alert and oriented x3. . Cardiovascular: Regular rate and rhythm with no rubs or gallops.  No thyromegaly or JVD noted.   Marland Kitchen Respiratory: Clear to auscultation with no wheezes or rales. Good inspiratory effort. . Abdomen: Soft nontender nondistended with normal bowel sounds x4 quadrants. . Musculoskeletal: No lower extremity edema. 2/4 pulses in all 4 extremities. Marland Kitchen Psychiatry: Mood is appropriate for condition and setting  Discharge Instructions You were cared for by a hospitalist during your hospital stay. If you have any questions about your discharge medications or the care you received while you were in the hospital after you are discharged, you can call the unit and asked to speak with the hospitalist on call if the hospitalist that took care of you is not available. Once you are discharged, your primary care physician will handle any further medical issues. Please note that NO REFILLS for any discharge medications will be authorized once you are discharged, as it is imperative that you return to your primary  care physician (or establish a relationship with a primary care physician if you do not have one) for your aftercare needs so that they can reassess your need for medications and monitor your lab values.   Allergies as of 10/23/2019   No Known Allergies     Medication List    STOP taking these medications   LORazepam 1 MG tablet Commonly known as: ATIVAN     TAKE these medications   acetaminophen 500 MG tablet Commonly known as: TYLENOL Take 1,000 mg by mouth every 6 (six) hours as needed for mild pain.   aspirin EC 81 MG tablet Take 1 tablet (81 mg total) by mouth daily for 21 days. Has stopped prior to procedure   BD Ultra-Fine Lancets lancets Test blood glucose BID DX code 250.00   clopidogrel 75 MG tablet Commonly known as: PLAVIX Take 1 tablet (75 mg total) by mouth daily. Start taking on: October 24, 2019   cyanocobalamin 1000 MCG/ML injection Commonly known as: (VITAMIN B-12) Inject 1 mL (1,000 mcg total) into the muscle every 30 (thirty) days.   Euthyrox 175 MCG tablet Generic drug: levothyroxine Take 1 tablet by mouth once daily   glimepiride 2 MG tablet Commonly known as: AMARYL Take 1 tablet (2 mg total) by mouth 2 (two) times daily. What changed: when to take this  glucose blood test strip Commonly known as: ONE TOUCH ULTRA TEST Test blood sugar BID Dx Code:  250.00 Pt uses Contour Meter   Luer Lock Safety Syringes 25G X 1" 3 ML Misc Generic drug: SYRINGE-NEEDLE (DISP) 3 ML Use to inject B12 q 30 days   metFORMIN 500 MG 24 hr tablet Commonly known as: Glucophage XR Two tablets bid   metoprolol succinate 25 MG 24 hr tablet Commonly known as: TOPROL-XL Take 1 tablet (25 mg total) by mouth daily. Start taking on: October 24, 2019 What changed: See the new instructions.   pantoprazole 40 MG tablet Commonly known as: PROTONIX TAKE 1 TABLET BY MOUTH TWICE DAILY WITH A MEAL   rosuvastatin 40 MG tablet Commonly known as: CRESTOR Take 1 tablet  (40 mg total) by mouth daily. Start taking on: October 24, 2019 What changed:   medication strength  how much to take   venlafaxine XR 150 MG 24 hr capsule Commonly known as: EFFEXOR-XR Take 1 capsule by mouth once daily      No Known Allergies  Contact information for follow-up providers    Einar Pheasant, MD. Call in 1 day(s).   Specialty: Internal Medicine Why: Please call for a post hospital follow-up appointment Contact information: [address] [place] [address] [place]. Call in 1 day(s).   Why: follow up post hospitalization with neurology. Contact information: [address]     [place] [id number] [phone]           Contact information for after-discharge care    [facility] .   Service: Skilled Nursing Contact information: [place] [phone]                   The results of significant diagnostics from this hospitalization (including imaging, microbiology, ancillary and laboratory) are listed below for reference.    Significant Diagnostic Studies: DG Chest 2 View  Result Date: 10/17/2019 CLINICAL DATA:  Weakness, nausea and vomiting EXAM: CHEST - 2 VIEW COMPARISON:  01/16/2013 FINDINGS: The heart size and mediastinal contours are within normal limits. Bandlike opacity within the right lung base. Mild streaky left basilar opacity. No pleural effusion. No pneumothorax. Mild superior endplate compression fracture within the midthoracic spine, at approximately T8. There may also be mild height loss at the T10 and T11 levels. IMPRESSION: 1. Bandlike opacity within the right lung base, which may represent atelectasis or scarring. 2. Multiple superior endplate compression deformities within the mid to lower thoracic spine. Similar findings were reported within  this location on radiographs dated 09/22/2019. The images from this exam were not immediately available for comparison at the time of dictation. Electronically Signed   By: Davina Poke D.O.   On: 10/17/2019 14:20   CT HEAD WO CONTRAST  Result Date: 10/17/2019 CLINICAL DATA:  Neuro deficit, acute, stroke suspected. Additional history provided: History of breast cancer, right-sided weakness which began a few days ago, arm and leg numbness, dizziness. EXAM: CT HEAD WITHOUT CONTRAST TECHNIQUE: Contiguous axial images were obtained from the base of the skull through the vertex without intravenous contrast. COMPARISON:  Report from brain MRI 12/01/1995 (images unavailable). FINDINGS: Brain: Moderate generalized cerebral atrophy. Mild multifocal ill-defined hypoattenuation within the cerebral white matter is nonspecific, but consistent with chronic small vessel ischemic disease. There are age-indeterminate lacunar infarcts within the internal capsules bilaterally (  for instance as seen on series 3, images 12 and 13). There is no acute intracranial hemorrhage. No demarcated cortical infarct. No extra-axial fluid collection. No evidence of intracranial mass. No midline shift. Vascular: No hyperdense vessel. Skull: Normal. Negative for fracture or focal lesion. Sinuses/Orbits: Visualized orbits show no acute finding. Minimal ethmoid sinus mucosal thickening. No significant mastoid effusion. IMPRESSION: No acute intracranial hemorrhage or acute demarcated cortical infarction. Age-indeterminate lacunar infarcts within the internal capsules bilaterally. Mild chronic small vessel ischemic disease. Moderate generalized cerebral atrophy. Electronically Signed   By: Kellie Simmering DO   On: 10/17/2019 10:28   MR BRAIN WO CONTRAST  Result Date: 10/17/2019 CLINICAL DATA:  Neuro deficit, acute, stroke suspected. Additional history provided: Right-sided weakness to arm and leg, numbness arm and leg, dizziness. EXAM: MRI HEAD  WITHOUT CONTRAST TECHNIQUE: Multiplanar, multiecho pulse sequences of the brain and surrounding structures were obtained without intravenous contrast. COMPARISON:  Noncontrast head CT performed earlier the same day 10/17/2019. Report from brain MRI 12/01/1995 (images unavailable). FINDINGS: Brain: Moderate generalized cerebral atrophy. There is a 10 mm focus of restricted diffusion within the left thalamus consistent with acute infarction (series 5, image 23). Multiple small T2 hyperintense foci within the bilateral basal ganglia and internal capsules appear to reflect prominent perivascular spaces. Background mild multifocal T2/FLAIR hyperintensity within the cerebral white matter which is nonspecific, but consistent with chronic small vessel ischemic disease. No evidence of intracranial mass. No chronic intracranial blood products. No extra-axial fluid collection. No midline shift. Vascular: Expected proximal arterial flow voids. Skull and upper cervical spine: No focal marrow lesion. Sinuses/Orbits: Visualized orbits show no acute finding. Mild ethmoid sinus mucosal thickening. No significant mastoid effusion. IMPRESSION: 10 mm acute infarct within the left thalamus. Mild cerebral white matter chronic small vessel ischemic disease. Moderate generalized cerebral atrophy. Mild ethmoid sinus mucosal thickening. Electronically Signed   By: Kellie Simmering DO   On: 10/17/2019 14:49   US Carotid Bilateral  Result Date: 10/18/2019 CLINICAL DATA:  Acute infarct within the left thalamus. EXAM: BILATERAL CAROTID DUPLEX ULTRASOUND TECHNIQUE: Pearline Cables scale imaging, color Doppler and duplex ultrasound were performed of bilateral carotid and vertebral arteries in the neck. COMPARISON:  MRI 10/17/2019 FINDINGS: Criteria: Quantification of carotid stenosis is based on velocity parameters that correlate the residual internal carotid diameter with NASCET-based stenosis levels, using the diameter of the distal internal carotid lumen  as the denominator for stenosis measurement. The following velocity measurements were obtained: RIGHT ICA: 63/15 cm/sec CCA: 29/93 cm/sec SYSTOLIC ICA/CCA RATIO:  1.3 ECA: 78 cm/sec LEFT ICA: 64/17 cm/sec CCA: 71/69 cm/sec SYSTOLIC ICA/CCA RATIO:  1.0 ECA: 58 cm/sec RIGHT CAROTID ARTERY: Small amount of plaque at the right carotid bulb and proximal internal carotid artery. Normal waveforms and velocities in the internal carotid artery. External carotid artery is patent with normal waveform. RIGHT VERTEBRAL ARTERY: Antegrade flow and normal waveform in the right vertebral artery. LEFT CAROTID ARTERY: Small amount of echogenic plaque at the left carotid bulb. External carotid artery is patent with normal waveform. Normal waveforms and velocities in the internal carotid artery. LEFT VERTEBRAL ARTERY: Antegrade flow and normal waveform in the left vertebral artery. 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 No significant stenosis. Estimated degree of stenosis in the internal carotid arteries is less than 50% bilaterally. Patent vertebral arteries with antegrade flow. Electronically Signed   By: Markus Daft M.D.   On: 10/18/2019 13:39   ECHOCARDIOGRAM COMPLETE  Result Date: 10/19/2019    ECHOCARDIOGRAM REPORT  Patient Name:   NAYRA COURY Date of Exam: 10/18/2019 Medical Rec #:  [id number]    Height:       67.0 in Accession #:    [id number]   Weight:       205.0 lb Date of Birth:  Nov 05, 1940    BSA:          2.044 m Patient Age:    72 years     BP:           135/72 mmHg Patient Gender: F            HR:           57 bpm. Exam Location:  [facility] Procedure: 2D Echo, Color Doppler, Cardiac Doppler and Intracardiac            Opacification Agent Indications:     I163.9 Stroke  History:         Patient has no prior history of Echocardiogram examinations.                  Risk Factors:Hypertension, Diabetes and HCL.  Sonographer:     Charmayne Sheer RDCS (AE) Referring Phys:  Baker Janus Soledad Gerlach NIU Diagnosing  Phys: Yolonda Kida MD  Sonographer Comments: Suboptimal apical window and no subcostal window. IMPRESSIONS  1. Left ventricular ejection fraction, by estimation, is 50 to 55%. The left ventricle has low normal function. The left ventricle has no regional wall motion abnormalities. Left ventricular diastolic parameters are consistent with Grade III diastolic dysfunction (restrictive).  2. Right ventricular systolic function is normal. The right ventricular size is normal.  3. The mitral valve is normal in structure. No evidence of mitral valve regurgitation.  4. The aortic valve is normal in structure. Aortic valve regurgitation is not visualized. FINDINGS  Left Ventricle: Left ventricular ejection fraction, by estimation, is 50 to 55%. The left ventricle has low normal function. The left ventricle has no regional wall motion abnormalities. Definity contrast agent was given IV to delineate the left ventricular endocardial borders. The left ventricular internal cavity size was normal in size. There is no left ventricular hypertrophy. Left ventricular diastolic parameters are consistent with Grade III diastolic dysfunction (restrictive). Right Ventricle: The right ventricular size is normal. No increase in right ventricular wall thickness. Right ventricular systolic function is normal. Left Atrium: Left atrial size was normal in size. Right Atrium: Right atrial size was normal in size. Pericardium: There is no evidence of pericardial effusion. Mitral Valve: The mitral valve is normal in structure. No evidence of mitral valve regurgitation. MV peak gradient, 2.6 mmHg. The mean mitral valve gradient is 1.0 mmHg. Tricuspid Valve: The tricuspid valve is normal in structure. Tricuspid valve regurgitation is not demonstrated. Aortic Valve: The aortic valve is normal in structure. Aortic valve regurgitation is not visualized. Aortic valve mean gradient measures 2.0 mmHg. Aortic valve peak gradient measures 4.1 mmHg.  Aortic valve area, by VTI measures 2.99 cm. Pulmonic Valve: The pulmonic valve was grossly normal. Pulmonic valve regurgitation is not visualized. Aorta: The ascending aorta was not well visualized. IAS/Shunts: No atrial level shunt detected by color flow Doppler.  LEFT VENTRICLE PLAX 2D LVIDd:         3.37 cm  Diastology LVIDs:         2.46 cm  LV e' medial:    5.77 cm/s LV PW:         1.26 cm  LV E/e' medial:  10.4 LV IVS:  0.94 cm  LV e' lateral:   5.11 cm/s LVOT diam:     2.10 cm  LV E/e' lateral: 11.8 LV SV:         66 LV SV Index:   32 LVOT Area:     3.46 cm  RIGHT VENTRICLE RV Basal diam:  3.26 cm LEFT ATRIUM             Index       RIGHT ATRIUM           Index LA diam:        2.40 cm 1.17 cm/m  RA Area:     14.10 cm LA Vol (A2C):   32.3 ml 15.81 ml/m RA Volume:   36.00 ml  17.62 ml/m LA Vol (A4C):   30.8 ml 15.07 ml/m LA Biplane Vol: 32.9 ml 16.10 ml/m  AORTIC VALVE                   PULMONIC VALVE AV Area (Vmax):    3.09 cm    PV Vmax:       0.63 m/s AV Area (Vmean):   2.75 cm    PV Vmean:      44.200 cm/s AV Area (VTI):     2.99 cm    PV VTI:        0.148 m AV Vmax:           101.00 cm/s PV Peak grad:  1.6 mmHg AV Vmean:          70.800 cm/s PV Mean grad:  1.0 mmHg AV VTI:            0.220 m AV Peak Grad:      4.1 mmHg AV Mean Grad:      2.0 mmHg LVOT Vmax:         90.20 cm/s LVOT Vmean:        56.300 cm/s LVOT VTI:          0.190 m LVOT/AV VTI ratio: 0.86  AORTA Ao Root diam: 3.60 cm MITRAL VALVE MV Area (PHT): 4.39 cm    SHUNTS MV Peak grad:  2.6 mmHg    Systemic VTI:  0.19 m MV Mean grad:  1.0 mmHg    Systemic Diam: 2.10 cm MV Vmax:       0.81 m/s MV Vmean:      54.1 cm/s MV Decel Time: 173 msec MV E velocity: 60.10 cm/s MV A velocity: 84.90 cm/s MV E/A ratio:  0.71 Dwayne D Callwood MD Electronically signed by Yolonda Kida MD Signature Date/Time: 10/19/2019/1:35:06 PM    Final     Microbiology: Recent Results (from the past 240 hour(s))  Respiratory Panel by RT PCR (Flu A&B,  Covid) - Nasopharyngeal Swab     Status: None   Collection Time: 10/17/19  1:59 PM   Specimen: Nasopharyngeal Swab  Result Value Ref Range Status   SARS Coronavirus 2 by RT PCR NEGATIVE NEGATIVE Final    Comment: (NOTE) SARS-CoV-2 target nucleic acids are NOT DETECTED.  The SARS-CoV-2 RNA is generally detectable in upper respiratoy specimens during the acute phase of infection. The lowest concentration of SARS-CoV-2 viral copies this assay can detect is 131 copies/mL. A negative result does not preclude SARS-Cov-2 infection and should not be used as the sole basis for treatment or other patient management decisions. A negative result may occur with  improper specimen collection/handling, submission of specimen other than nasopharyngeal swab, presence of viral mutation(s) within  the areas targeted by this assay, and inadequate number of viral copies (<131 copies/mL). A negative result must be combined with clinical observations, patient history, and epidemiological information. The expected result is Negative.  Fact Sheet for Patients:  PinkCheek.be  Fact Sheet for Healthcare Providers:  GravelBags.it  This test is no t yet approved or cleared by the [place] FDA and  has been authorized for detection and/or diagnosis of SARS-CoV-2 by FDA under an Emergency Use Authorization (EUA). This EUA will remain  in effect (meaning this test can be used) for the duration of the COVID-19 declaration under Section 564(b)(1) of the Act, 21 U.S.C. section 360bbb-3(b)(1), unless the authorization is terminated or revoked sooner.     Influenza A by PCR NEGATIVE NEGATIVE Final   Influenza B by PCR NEGATIVE NEGATIVE Final    Comment: (NOTE) The Xpert Xpress SARS-CoV-2/FLU/RSV assay is intended as an aid in  the diagnosis of influenza from Nasopharyngeal swab specimens and  should not be used as a sole basis for treatment. Nasal  washings and  aspirates are unacceptable for Xpert Xpress SARS-CoV-2/FLU/RSV  testing.  Fact Sheet for Patients: PinkCheek.be  Fact Sheet for Healthcare Providers: GravelBags.it  This test is not yet approved or cleared by the [place] FDA and  has been authorized for detection and/or diagnosis of SARS-CoV-2 by  FDA under an Emergency Use Authorization (EUA). This EUA will remain  in effect (meaning this test can be used) for the duration of the  Covid-19 declaration under Section 564(b)(1) of the Act, 21  U.S.C. section 360bbb-3(b)(1), unless the authorization is  terminated or revoked. Performed at [facility] [address] [place], [place] [address]   Respiratory Panel by RT PCR (Flu A&B, Covid) - Nasopharyngeal Swab     Status: None   Collection Time: 10/23/19 12:39 PM   Specimen: Nasopharyngeal Swab  Result Value Ref Range Status   SARS Coronavirus 2 by RT PCR NEGATIVE NEGATIVE Final    Comment: (NOTE) SARS-CoV-2 target nucleic acids are NOT DETECTED.  The SARS-CoV-2 RNA is generally detectable in upper respiratoy specimens during the acute phase of infection. The lowest concentration of SARS-CoV-2 viral copies this assay can detect is 131 copies/mL. A negative result does not preclude SARS-Cov-2 infection and should not be used as the sole basis for treatment or other patient management decisions. A negative result may occur with  improper specimen collection/handling, submission of specimen other than nasopharyngeal swab, presence of viral mutation(s) within the areas targeted by this assay, and inadequate number of viral copies (<131 copies/mL). A negative result must be combined with clinical observations, patient history, and epidemiological information. The expected result is Negative.  Fact Sheet for Patients:  PinkCheek.be  Fact Sheet for Healthcare  Providers:  GravelBags.it  This test is no t yet approved or cleared by the [place] FDA and  has been authorized for detection and/or diagnosis of SARS-CoV-2 by FDA under an Emergency Use Authorization (EUA). This EUA will remain  in effect (meaning this test can be used) for the duration of the COVID-19 declaration under Section 564(b)(1) of the Act, 21 U.S.C. section 360bbb-3(b)(1), unless the authorization is terminated or revoked sooner.     Influenza A by PCR NEGATIVE NEGATIVE Final   Influenza B by PCR NEGATIVE NEGATIVE Final    Comment: (NOTE) The Xpert Xpress SARS-CoV-2/FLU/RSV assay is intended as an aid in  the diagnosis of influenza from Nasopharyngeal swab specimens and  should not be used as  a sole basis for treatment. Nasal washings and  aspirates are unacceptable for Xpert Xpress SARS-CoV-2/FLU/RSV  testing.  Fact Sheet for Patients: PinkCheek.be  Fact Sheet for Healthcare Providers: GravelBags.it  This test is not yet approved or cleared by the [place] FDA and  has been authorized for detection and/or diagnosis of SARS-CoV-2 by  FDA under an Emergency Use Authorization (EUA). This EUA will remain  in effect (meaning this test can be used) for the duration of the  Covid-19 declaration under Section 564(b)(1) of the Act, 21  U.S.C. section 360bbb-3(b)(1), unless the authorization is  terminated or revoked. Performed at [facility], [address] [place], [place] [address]      Labs: Basic Metabolic Panel: Recent Labs  Lab 10/17/19 0928 10/19/19 0307  NA 140 140  K 3.3* 4.7  CL 105 104  CO2 24 28  GLUCOSE 184* 127*  BUN 16 19  CREATININE 1.12* 1.05*  CALCIUM 9.4 9.1  MG 2.0  --    Liver Function Tests: Recent Labs  Lab 10/17/19 0928  AST 39  ALT 20  ALKPHOS 102  BILITOT 0.9  PROT 8.0  ALBUMIN 4.1   Recent Labs  Lab 10/17/19 0928   LIPASE 20   No results for input(s): AMMONIA in the last 168 hours. CBC: Recent Labs  Lab 10/17/19 0928  WBC 7.6  NEUTROABS 5.6  HGB 14.5  HCT 42.3  MCV 101.9*  PLT 189   Cardiac Enzymes: No results for input(s): CKTOTAL, CKMB, CKMBINDEX, TROPONINI in the last 168 hours. BNP: BNP (last 3 results) No results for input(s): BNP in the last 8760 hours.  ProBNP (last 3 results) No results for input(s): PROBNP in the last 8760 hours.  CBG: Recent Labs  Lab 10/22/19 1111 10/22/19 1612 10/22/19 2118 10/23/19 0733 10/23/19 1130  GLUCAP 258* 207* 186* 134* 200*       Signed:  Kayleen Memos, MD [facility] 10/23/2019, 2:47 PM

## 2019-10-23 NOTE — Progress Notes (Signed)
Patient is stable and ready for discharge. Patient's IV removed. Patient is discharging to [facility] called report and spoke with Levada Dy accepting nurse and she verbalized understanding of report provided. Patient's belongings packed by NT. Patient's hard script prescriptions placed in discharge packet. Patient is transporting via [facility].

## 2019-10-23 NOTE — TOC Transition Note (Signed)
Transition of Care [facility]) - CM/SW Discharge Note   Patient Details  Name: KORIANNA WASHER MRN: [id number] Date of Birth: [birth year]  Transition of Care [facility]) CM/SW Contact:  Shelbie Ammons, RN Phone Number: 10/23/2019, 1:35 PM   Clinical Narrative:   Facility has received insurance authorization and is ready for patient, she will be going to room 502. EMS/discharge paperwork prepared and with chart.       Barriers to Discharge: No Barriers Identified   Patient Goals and CMS Choice   CMS Medicare.gov Compare Post Acute Care list provided to:: Patient Choice offered to / list presented to : Patient  Discharge Placement                       Discharge Plan and Services     Post Acute Care Choice: [place]                               Social Determinants of Health (SDOH) Interventions     Readmission Risk Interventions No flowsheet data found.

## 2019-10-23 NOTE — Discharge Summary (Signed)
PROGRESS NOTE  CANDID BOVEY ERD:[id number] DOB: [birth year] DOA: 10/17/2019 PCP: Einar Pheasant, MD  HPI/Recap of past 24 hours:  Joyce Maynard is a 79 y.o. female with medical history significant for medication noncompliance, essential hypertension, hyperlipidemia, type II diabetes mellitus, GERD, hypothyroidism, chronic depression/anxiety, vertigo, breast cancer (s/p lumpectomy, chemo and radiation therapy), polio, CKD stage III, who presents with right-sided weakness and numbness for several days.  No slurred speech, vision loss or hearing loss.  Denies chest pain, shortness breath, cough, fever or chills. She had nausea and vomited several times, with one episode of loose stool.  No abdominal pain.  Denies symptoms of UTI.    ED Course: Troponin level 9 --> 6, WBC 7.6K, INR 0.9, PTT 27, COVID-19 viral screening test negative, renal function close to baseline, temperature normal, blood pressure 113/83, heart rate 58, RR 18, oxygen saturation 99% on room air.    CT head showed age indeterminate lacunar stroke.  MRI of brain showed 10 mm left thalamus stroke.   Admitted for stroke work up.  Neurology/stroke team followed.  PT OT recommended SNF.  TOC was consulted to assist with SNF placement.  10/23/19: Seen and examined at bedside.  She has no new complaints.  She is awaiting bed placement.  Seen by PT with recommendation for SNF.  Continue PT OT as tolerated with assistance and fall precautions.  Assessment/Plan: Principal Problem:   Stroke [facility]) Active Problems:   Essential hypertension, benign   Type II diabetes mellitus with renal manifestations (HCC)   Hypercholesterolemia   Hypothyroidism   GERD (gastroesophageal reflux disease)   Anxiety   Compression fracture of thoracic vertebra (HCC)   Nausea vomiting and diarrhea   Hypokalemia   Acute CVA (cerebrovascular accident) ([place])  Acute CVA involving Left Thalamus MRI brain showed acute 10 mm left thalamus CVA Seen by neuro  stroke team Admitted for stroke work up LDL 133 goal less that 70 A1C 7.3 goal <7.0 B/L carotid doppler US, less than 50% stenosis bilaterally.   2D echo normal LVEF 50 to 55% with no wall motion abnormalities ASA 81mg  and plavix 75 mg daily per neuro recs Crestor 20 mg daily at home, dose increased to 40 mg daily PT OT recommend SNF-TOC consulted to assist with SNF placement. Passed speech swallow eval Continue PT OT with assistance and fall precautions. Follow-up with neurology  HLD LDL 133, not at goal Management per above  DM2 with hyperglycemia A1C 7.3, not at goal Management per above   Essential HTN BP at goal Continue home Toprol-XL 25 mg daily.  Chronic anxiety/depression Continue home regimen Continue Effexor daily and ativan PRN PTA, confirmed by her son who assists her with her medications.  Uncontrolled hypothyroidism likely due to medication noncompliance TSH 44 on 10/17/19, Free T4 0.91 Likely noncompliance, continue home levothyroxine 175 mcg daily. Will need to repeat TSH in 4 to 6 weeks when acute illness has resolved. Follow-up with your PCP  Ambulatory dysfunction in the setting of acute stroke Continue PT OT with assistance Fall Precautions    Code Status: Full     Consultants:  Neurology  Procedures:  TTE   Antimicrobials:  None   Objective: Vitals:   10/22/19 2004 10/23/19 0018 10/23/19 0422 10/23/19 0732  BP: 130/69 121/60 138/86 126/85  Pulse: 69 69 68 75  Resp: 16 16 19 17   Temp: 98.7 F (37.1 C) 98.3 F (36.8 C) 98.4 F (36.9 C) 97.8 F (36.6 C)  TempSrc: Oral Oral  Oral  SpO2: 97% 100% 96% 95%  Weight:      Height:        Intake/Output Summary (Last 24 hours) at 10/23/2019 1222 Last data filed at 10/23/2019 1000 Gross per 24 hour  Intake 120 ml  Output 0 ml  Net 120 ml   Filed Weights   10/17/19 0925  Weight: 93 kg    Exam: . General: 79 y.o. year-old female well-developed and nourished in no distress.   Alert and Oriented x3.   . Cardiovascular: Regular rate and rhythm no rubs.   Marland Kitchen Respiratory: Clear to auscultation no wheezes or rales..   . Abdomen: Soft nontender normal bowel sounds present.   . Musculoskeletal: No lower extremity edema bilaterally. Marland Kitchen Psychiatry: Mood is appropriate for condition and setting.   Data Reviewed: CBC: Recent Labs  Lab 10/17/19 0928  WBC 7.6  NEUTROABS 5.6  HGB 14.5  HCT 42.3  MCV 101.9*  PLT 010   Basic Metabolic Panel: Recent Labs  Lab 10/17/19 0928 10/19/19 0307  NA 140 140  K 3.3* 4.7  CL 105 104  CO2 24 28  GLUCOSE 184* 127*  BUN 16 19  CREATININE 1.12* 1.05*  CALCIUM 9.4 9.1  MG 2.0  --    GFR: Estimated Creatinine Clearance: 50.9 mL/min (A) (by C-G formula based on SCr of 1.05 mg/dL (H)). Liver Function Tests: Recent Labs  Lab 10/17/19 0928  AST 39  ALT 20  ALKPHOS 102  BILITOT 0.9  PROT 8.0  ALBUMIN 4.1   Recent Labs  Lab 10/17/19 0928  LIPASE 20   No results for input(s): AMMONIA in the last 168 hours. Coagulation Profile: Recent Labs  Lab 10/17/19 0928  INR 0.9   Cardiac Enzymes: No results for input(s): CKTOTAL, CKMB, CKMBINDEX, TROPONINI in the last 168 hours. BNP (last 3 results) No results for input(s): PROBNP in the last 8760 hours. HbA1C: No results for input(s): HGBA1C in the last 72 hours. CBG: Recent Labs  Lab 10/22/19 1111 10/22/19 1612 10/22/19 2118 10/23/19 0733 10/23/19 1130  GLUCAP 258* 207* 186* 134* 200*   Lipid Profile: No results for input(s): CHOL, HDL, LDLCALC, TRIG, CHOLHDL, LDLDIRECT in the last 72 hours. Thyroid Function Tests: No results for input(s): TSH, T4TOTAL, FREET4, T3FREE, THYROIDAB in the last 72 hours. Anemia Panel: No results for input(s): VITAMINB12, FOLATE, FERRITIN, TIBC, IRON, RETICCTPCT in the last 72 hours. Urine analysis:    Component Value Date/Time   COLORURINE YELLOW (A) 10/18/2019 0238   APPEARANCEUR CLOUDY (A) 10/18/2019 0238   LABSPEC 1.024  10/18/2019 0238   PHURINE 5.0 10/18/2019 0238   GLUCOSEU NEGATIVE 10/18/2019 0238   GLUCOSEU 100 (A) 09/23/2015 1258   HGBUR SMALL (A) 10/18/2019 0238   BILIRUBINUR NEGATIVE 10/18/2019 0238   KETONESUR NEGATIVE 10/18/2019 0238   PROTEINUR NEGATIVE 10/18/2019 0238   UROBILINOGEN 0.2 09/23/2015 1258   NITRITE NEGATIVE 10/18/2019 0238   LEUKOCYTESUR LARGE (A) 10/18/2019 0238   Sepsis Labs: @LABRCNTIP (procalcitonin:4,lacticidven:4)  ) Recent Results (from the past 240 hour(s))  Respiratory Panel by RT PCR (Flu A&B, Covid) - Nasopharyngeal Swab     Status: None   Collection Time: 10/17/19  1:59 PM   Specimen: Nasopharyngeal Swab  Result Value Ref Range Status   SARS Coronavirus 2 by RT PCR NEGATIVE NEGATIVE Final    Comment: (NOTE) SARS-CoV-2 target nucleic acids are NOT DETECTED.  The SARS-CoV-2 RNA is generally detectable in upper respiratoy specimens during the acute phase of infection. The lowest concentration of SARS-CoV-2  viral copies this assay can detect is 131 copies/mL. A negative result does not preclude SARS-Cov-2 infection and should not be used as the sole basis for treatment or other patient management decisions. A negative result may occur with  improper specimen collection/handling, submission of specimen other than nasopharyngeal swab, presence of viral mutation(s) within the areas targeted by this assay, and inadequate number of viral copies (<131 copies/mL). A negative result must be combined with clinical observations, patient history, and epidemiological information. The expected result is Negative.  Fact Sheet for Patients:  PinkCheek.be  Fact Sheet for Healthcare Providers:  GravelBags.it  This test is no t yet approved or cleared by the [place] FDA and  has been authorized for detection and/or diagnosis of SARS-CoV-2 by FDA under an Emergency Use Authorization (EUA). This EUA will remain    in effect (meaning this test can be used) for the duration of the COVID-19 declaration under Section 564(b)(1) of the Act, 21 U.S.C. section 360bbb-3(b)(1), unless the authorization is terminated or revoked sooner.     Influenza A by PCR NEGATIVE NEGATIVE Final   Influenza B by PCR NEGATIVE NEGATIVE Final    Comment: (NOTE) The Xpert Xpress SARS-CoV-2/FLU/RSV assay is intended as an aid in  the diagnosis of influenza from Nasopharyngeal swab specimens and  should not be used as a sole basis for treatment. Nasal washings and  aspirates are unacceptable for Xpert Xpress SARS-CoV-2/FLU/RSV  testing.  Fact Sheet for Patients: PinkCheek.be  Fact Sheet for Healthcare Providers: GravelBags.it  This test is not yet approved or cleared by the [place] FDA and  has been authorized for detection and/or diagnosis of SARS-CoV-2 by  FDA under an Emergency Use Authorization (EUA). This EUA will remain  in effect (meaning this test can be used) for the duration of the  Covid-19 declaration under Section 564(b)(1) of the Act, 21  U.S.C. section 360bbb-3(b)(1), unless the authorization is  terminated or revoked. Performed at [facility], [address] [place], [place] [address]       Studies: No results found.  Scheduled Meds: . aspirin EC  81 mg Oral Daily  . clopidogrel  75 mg Oral Daily  . enoxaparin (LOVENOX) injection  0.5 mg/kg Subcutaneous Q24H  . insulin aspart  0-5 Units Subcutaneous QHS  . insulin aspart  0-9 Units Subcutaneous TID WC  . levothyroxine  175 mcg Oral Daily  . metoprolol succinate  25 mg Oral Daily  . rosuvastatin  40 mg Oral Daily  . venlafaxine XR  150 mg Oral Daily    Continuous Infusions:   LOS: 5 days     Kayleen Memos, MD [facility] Pager [phone]  If 7PM-7AM, please contact night-coverage [email or web address] Password [email or web address] 10/23/2019, 12:22 PM

## 2019-10-23 NOTE — Care Management Important Message (Signed)
Important Message  Patient Details  Name: Joyce Maynard MRN: [id number] Date of Birth: Mar 15, 1940   Medicare Important Message Given:  Yes     [name] 10/23/2019, 11:38 AM

## 2019-10-23 NOTE — Progress Notes (Signed)
Physical Therapy Treatment Patient Details Name: Joyce Maynard MRN: [id number] DOB: [birth year] Today's Date: 10/23/2019    History of Present Illness Pt is a 79 y/o F who presented to hospital on 10/17/2019 with chief complaint of R side weakness & numbness & vomiting for several days. 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 Pt also seein in ED 3 weeks ago after fall & sustaining rib fx with chest & back pain. Pt with PMH significant for HTN, HLD, DM, depression with anxiety, vertigo, breast CA (s/p lumpectomy with chemo & radiation), polio, CKD stage 3.    PT Comments    Out of bed with increased time and rail.  Steady in sitting.  Stood with min a x 1 to RW and is able to walk to nursing station (with standing rest leaning on wall) then return to bathroom to void.  Walked to recliner and remained up in chair with tech in room.  Pt with more awareness of RLE today and less dragging.  She does have decreased step height and length as she fatigues.  SNF remains appropriate.   Follow Up Recommendations  SNF     Equipment Recommendations       Recommendations for Other Services       Precautions / Restrictions Precautions Precautions: Fall Precaution Comments: R hemi (slight) Restrictions Weight Bearing Restrictions: No    Mobility  Bed Mobility Overal bed mobility: Needs Assistance Bed Mobility: Supine to Sit     Supine to sit: Min guard;Supervision     General bed mobility comments: slow but no assist needed  Transfers Overall transfer level: Needs assistance Equipment used: Rolling walker (2 wheeled) Transfers: Sit to/from Stand Sit to Stand: Min guard;Min assist            Ambulation/Gait Ambulation/Gait assistance: Min Web designer (Feet): 80 Feet Assistive device: Rolling walker (2 wheeled) Gait Pattern/deviations: Decreased stride length;Decreased step length - left;Decreased step length - right Gait velocity: decreased   General Gait Details: more  aware of RLE today.  Occasionally drags when fatigued but much improved.   Stairs             Wheelchair Mobility    Modified Rankin (Stroke Patients Only)       Balance Overall balance assessment: Needs assistance   Sitting balance-Leahy Scale: Good     Standing balance support: Bilateral upper extremity supported;During functional activity Standing balance-Leahy Scale: Fair Standing balance comment: reliant on RW for support                            Cognition Arousal/Alertness: Awake/alert Behavior During Therapy: WFL for tasks assessed/performed Overall Cognitive Status: Within Functional Limits for tasks assessed                                        Exercises      General Comments        Pertinent Vitals/Pain Pain Assessment: No/denies pain    Home Living                      Prior Function            PT Goals (current goals can now be found in the care plan section) Progress towards PT goals: Progressing toward goals    Frequency    7X/week  PT Plan Current plan remains appropriate    Co-evaluation              AM-PAC PT "6 Clicks" Mobility   Outcome Measure  Help needed turning from your back to your side while in a flat bed without using bedrails?: A Little Help needed moving from lying on your back to sitting on the side of a flat bed without using bedrails?: A Little Help needed moving to and from a bed to a chair (including a wheelchair)?: A Little Help needed standing up from a chair using your arms (e.g., wheelchair or bedside chair)?: A Little Help needed to walk in hospital room?: A Little Help needed climbing 3-5 steps with a railing? : A Lot 6 Click Score: 17    End of Session Equipment Utilized During Treatment: Gait belt Activity Tolerance: Patient tolerated treatment well;Patient limited by fatigue Patient left: in chair;with call bell/phone within reach;with chair  alarm set;with nursing/sitter in room Nurse Communication: Mobility status Hemiplegia - Right/Left: Right Hemiplegia - dominant/non-dominant: Dominant Hemiplegia - caused by: Cerebral infarction     Time: 7076-1518 PT Time Calculation (min) (ACUTE ONLY): 17 min  Charges:  $Gait Training: 8-22 mins                    Chesley Noon, PTA 10/23/19, 3:03 PM

## 2019-11-06 ENCOUNTER — Telehealth: Payer: Self-pay | Admitting: Internal Medicine

## 2019-11-06 NOTE — Telephone Encounter (Signed)
LMTCB

## 2019-11-06 NOTE — Telephone Encounter (Signed)
Christy RN  From [facility] homecare called [phone] about patient she is having a increased amount of depression trying to adjust to being in a homecare faulty. Rn Alyse Low can be reached after 11:45 today in meeting

## 2019-11-08 NOTE — Telephone Encounter (Signed)
LM for Joyce Maynard at [facility]

## 2019-11-13 NOTE — Telephone Encounter (Signed)
LM for [place] and patient

## 2019-11-20 ENCOUNTER — Telehealth: Payer: Self-pay | Admitting: Internal Medicine

## 2019-11-20 NOTE — Telephone Encounter (Signed)
LMTCB

## 2019-11-20 NOTE — Telephone Encounter (Signed)
Joyce Maynard patient's son called in stated that mother was discharged  WithmetFORMIN (GLUCOPHAGE XR) 500 MG 24 hr tablet twice a day, but Dr.Scott had her on 1000 twice a day wanted to know if she need to go back on 1000 or stay on 500 and also in B12 inj called in

## 2019-11-22 DIAGNOSIS — Z9181 History of falling: Secondary | ICD-10-CM

## 2019-11-22 DIAGNOSIS — I2510 Atherosclerotic heart disease of native coronary artery without angina pectoris: Secondary | ICD-10-CM

## 2019-11-22 DIAGNOSIS — N183 Chronic kidney disease, stage 3 unspecified: Secondary | ICD-10-CM

## 2019-11-22 DIAGNOSIS — R42 Dizziness and giddiness: Secondary | ICD-10-CM

## 2019-11-22 DIAGNOSIS — E039 Hypothyroidism, unspecified: Secondary | ICD-10-CM

## 2019-11-22 DIAGNOSIS — Z853 Personal history of malignant neoplasm of breast: Secondary | ICD-10-CM

## 2019-11-22 DIAGNOSIS — Z8781 Personal history of (healed) traumatic fracture: Secondary | ICD-10-CM

## 2019-11-22 DIAGNOSIS — E1122 Type 2 diabetes mellitus with diabetic chronic kidney disease: Secondary | ICD-10-CM

## 2019-11-22 DIAGNOSIS — Z7902 Long term (current) use of antithrombotics/antiplatelets: Secondary | ICD-10-CM

## 2019-11-22 DIAGNOSIS — I69351 Hemiplegia and hemiparesis following cerebral infarction affecting right dominant side: Secondary | ICD-10-CM | POA: Diagnosis not present

## 2019-11-22 DIAGNOSIS — F32A Depression, unspecified: Secondary | ICD-10-CM

## 2019-11-22 DIAGNOSIS — I129 Hypertensive chronic kidney disease with stage 1 through stage 4 chronic kidney disease, or unspecified chronic kidney disease: Secondary | ICD-10-CM

## 2019-11-22 DIAGNOSIS — Z8612 Personal history of poliomyelitis: Secondary | ICD-10-CM

## 2019-11-22 DIAGNOSIS — K5900 Constipation, unspecified: Secondary | ICD-10-CM

## 2019-11-22 DIAGNOSIS — E785 Hyperlipidemia, unspecified: Secondary | ICD-10-CM

## 2019-11-22 DIAGNOSIS — K219 Gastro-esophageal reflux disease without esophagitis: Secondary | ICD-10-CM

## 2019-11-22 DIAGNOSIS — Z7982 Long term (current) use of aspirin: Secondary | ICD-10-CM

## 2019-11-22 NOTE — Telephone Encounter (Signed)
She does need an appt. Have them check and record her blood sugars bid and send in readings over the next 2-3 weeks.  Can see how to adjust medication then.

## 2019-11-22 NOTE — Telephone Encounter (Signed)
Sugars have been running over 200 consistently since decreasing metformin. Alyse Low would like to know if it is ok to increase back up to two tablets bid like she was doing before going to the hospital. Also- regarding depression, she thinks the effexor is not working anymore. Advised that effexor has not been filled since 08/2019 and was reported not taking 10/2019. I am going to set her up for an appt to discuss treatment options. Just wanted to clarify her metformin with you first.

## 2019-11-22 NOTE — Telephone Encounter (Signed)
Joyce Maynard with [facility] [phone] called to follow up on message below and wanted to let Dr. Nicki Reaper know that she is battling with the depression   Please contact [place] at [phone]

## 2019-11-23 NOTE — Telephone Encounter (Signed)
Patient scheduled with Dr Nicki Reaper for tomorrow.

## 2019-11-24 ENCOUNTER — Telehealth (INDEPENDENT_AMBULATORY_CARE_PROVIDER_SITE_OTHER): Payer: Medicare HMO | Admitting: Internal Medicine

## 2019-11-24 ENCOUNTER — Other Ambulatory Visit: Payer: Self-pay

## 2019-11-24 DIAGNOSIS — E7800 Pure hypercholesterolemia, unspecified: Secondary | ICD-10-CM

## 2019-11-24 DIAGNOSIS — I10 Essential (primary) hypertension: Secondary | ICD-10-CM | POA: Diagnosis not present

## 2019-11-24 DIAGNOSIS — Z853 Personal history of malignant neoplasm of breast: Secondary | ICD-10-CM | POA: Diagnosis not present

## 2019-11-24 DIAGNOSIS — F320 Major depressive disorder, single episode, mild: Secondary | ICD-10-CM

## 2019-11-24 DIAGNOSIS — Z8673 Personal history of transient ischemic attack (TIA), and cerebral infarction without residual deficits: Secondary | ICD-10-CM | POA: Diagnosis not present

## 2019-11-24 DIAGNOSIS — R531 Weakness: Secondary | ICD-10-CM

## 2019-11-24 DIAGNOSIS — N1832 Chronic kidney disease, stage 3b: Secondary | ICD-10-CM

## 2019-11-24 DIAGNOSIS — E039 Hypothyroidism, unspecified: Secondary | ICD-10-CM

## 2019-11-24 DIAGNOSIS — F32A Depression, unspecified: Secondary | ICD-10-CM

## 2019-11-24 DIAGNOSIS — Z91148 Patient's other noncompliance with medication regimen for other reason: Secondary | ICD-10-CM

## 2019-11-24 DIAGNOSIS — Z9114 Patient's other noncompliance with medication regimen: Secondary | ICD-10-CM

## 2019-11-24 DIAGNOSIS — E1122 Type 2 diabetes mellitus with diabetic chronic kidney disease: Secondary | ICD-10-CM

## 2019-11-24 NOTE — Progress Notes (Signed)
[name], female   DOB: 08/17/1940, 79 y.o.   MRN: [id number]   Virtual Visit via video Note  This visit type was conducted due to national recommendations for restrictions regarding the COVID-19 pandemic (e.g. social distancing).  This format is felt to be most appropriate for this patient at this time.  All issues noted in this document were discussed and addressed.  No physical exam was performed (except for noted visual exam findings with Video Visits).   I connected with Lib Racette by a video enabled telemedicine application and verified that I am speaking with the correct person using two identifiers. Location patient: home Location provider: work  Persons participating in the virtual visit: patient, provider and [name].   The limitations, risks, security and privacy concerns of performing an evaluation and management service by video and the availability of in person appointments have been discussed.  It has also been discussed with the patient that there may be a patient responsible charge related to this service. The patient expressed understanding and agreed to proceed.   Reason for visit: hospital/SNF follow up.    HPI: Admitted 10/17/19 - 10/23/19 - diagnosed with acute CVA.  MRI revealed acute 63m left thalamus CVA.  Evaluated by neuro.  Admitted for stroke w/up.  Carotid doppler - <50% stenosis bilaterally.  ECHO - EF 50-55%.  ASA and plavix recommended by neuro.  crestor increased to 475mq day.  PT/OT evaluated and recommended SNF.  [facility].  Discharged home one week ago.  Continuing therapy at home.  Using a walker to get around.  Reports increased fatigue.  Does not bend over - will lose balance.  Therapy working with her.  She is drinking and eating.  Had no swallowing issues.  Has been more depressed recently.  States started worsening in rehab.  She had been on effexor for years.  Has done relatively well on  this medication.  Does feel needs something more now.  Feels better since being home, but still reports some increased depression.  No desire to do things.  No suicidal ideations.  No chest pain.  Breathing stable.  No abdominal pain.  Bowels moving.  Not checking sugars.  Needs a glucometer.  Concerned about cost of medications, etc.     ROS: See pertinent positives and negatives per HPI.  Past Medical History:  Diagnosis Date  . Breast cancer ([facility]2005   s/p chemotherapy and XRT  . Depression   . Diabetes ([place]  . GERD (gastroesophageal reflux disease)   . Hx of vertigo   . Hypercholesterolemia   . Hypertension   . Hypothyroidism   . Polio 1948   history  . PONV (postoperative nausea and vomiting)     Past Surgical History:  Procedure Laterality Date  . BREAST BIOPSY Left 2005  . BREAST BIOPSY Left 10/25/2015   [place]  . BREAST LUMPECTOMY Left 2005  . EXCISION OF BREAST LESION Left 11/19/2015   Procedure: EXCISION OF BREAST LESION/ MASS;  Surgeon: JaLeonie GreenMD;  Location: [facility] ORS;  Service: General;  Laterality: Left;  . EYE SURGERY     cataracts bilateral  . TUBAL LIGATION      Family History  Problem Relation Age of Onset  . Cancer Mother        breast  . Breast cancer Mother 7070. Stroke Father   . Breast cancer Maternal  Aunt     SOCIAL HX: reviewed.    Current Outpatient Medications:  .  acetaminophen (TYLENOL) 500 MG tablet, Take 1,000 mg by mouth every 6 (six) hours as needed for mild pain., Disp: , Rfl:  .  BD ULTRA-FINE LANCETS lancets, Test blood glucose BID DX code 250.00, Disp: 200 each, Rfl: 1 .  clopidogrel (PLAVIX) 75 MG tablet, Take 1 tablet (75 mg total) by mouth daily., Disp: 180 tablet, Rfl: 0 .  cyanocobalamin (,VITAMIN B-12,) 1000 MCG/ML injection, Inject 1 mL (1,000 mcg total) into the muscle every 30 (thirty) days. (Patient not taking: Reported on 10/17/2019), Disp: 12 mL, Rfl: 0 .  EUTHYROX 175 MCG tablet, Take 1  tablet by mouth once daily, Disp: 90 tablet, Rfl: 0 .  glimepiride (AMARYL) 2 MG tablet, Take 1 tablet (2 mg total) by mouth 2 (two) times daily. (Patient taking differently: Take 2 mg by mouth daily with breakfast. ), Disp: 180 tablet, Rfl: 0 .  glucose blood (ONE TOUCH ULTRA TEST) test strip, Test blood sugar BID Dx Code:  250.00 Pt uses Contour Meter, Disp: 200 each, Rfl: 1 .  metFORMIN (GLUCOPHAGE XR) 500 MG 24 hr tablet, Two tablets bid (Patient not taking: Reported on 10/17/2019), Disp: 120 tablet, Rfl: 3 .  metoprolol succinate (TOPROL-XL) 25 MG 24 hr tablet, Take 1 tablet (25 mg total) by mouth daily., Disp: 90 tablet, Rfl: 0 .  pantoprazole (PROTONIX) 40 MG tablet, TAKE 1 TABLET BY MOUTH TWICE DAILY WITH A MEAL (Patient not taking: Reported on 10/17/2019), Disp: 180 tablet, Rfl: 0 .  rosuvastatin (CRESTOR) 40 MG tablet, Take 1 tablet (40 mg total) by mouth daily., Disp: 90 tablet, Rfl: 0 .  SYRINGE-NEEDLE, DISP, 3 ML (LUER LOCK SAFETY SYRINGES) 25G X 1" 3 ML MISC, Use to inject B12 q 30 days, Disp: 12 each, Rfl: 0 .  venlafaxine XR (EFFEXOR-XR) 150 MG 24 hr capsule, Take 1 capsule by mouth once daily (Patient not taking: Reported on 10/17/2019), Disp: 90 capsule, Rfl: 0  EXAM:  GENERAL: alert, oriented, appears well and in no acute distress  HEENT: atraumatic, conjunttiva clear, no obvious abnormalities on inspection of external nose and ears  NECK: normal movements of the head and neck  LUNGS: on inspection no signs of respiratory distress, breathing rate appears normal, no obvious gross SOB, gasping or wheezing  CV: no obvious cyanosis  PSYCH/NEURO: pleasant and cooperative, no obvious depression or anxiety, speech and thought processing grossly intact  ASSESSMENT AND PLAN:  Discussed the following assessment and plan:  Problem List Items Addressed This Visit    Weakness    Continue PT.        Type II diabetes mellitus with renal manifestations (HCC)    Low carb diet and  exercise.  Metformin dose adjusted when in hospital.  They were questioning increasing metformin dose.  Need sugar readings.  Old glucometer.  Needs new.  D/w pharmacy - CGM.  No on insulin.  Not sure of cost.  Discussed the need for sugar readings to help adjust medication.  Follow met b and a1c.  Will see if home health nurse can go out and draw f/u labs.        Relevant Orders   Ambulatory referral to Chronic Care Management Services   Mild depression [facility])    Has a history of depression.  Recently worsened - with admission for CVA and then discharge to rehab.  Unable to see her family, etc.  On effexor.  No  suicidal ideations.  Feels needs something more to help with her depression.  D/w psychiatry.  Follow.       Hypothyroidism    On thyroid replacement.  Follow tsh.       Hypercholesterolemia    On crestor 24m q day.  Low cholesterol diet and exercise.  Follow lipid panel and liver function tests.        History of medication noncompliance    Discussed importance of taking her medications regularly and keeping appts.  Follow.       History of CVA (cerebrovascular accident)    Recently admitted and diagnosed with acute cva as outlined.  Neurology evaluated.  W/up as outlined.  Discussed recommendation for aspirin and plavix.  Taking plavix.  Will continue add aspirin - 823mq day.  Continue PT/OT.  Also have nursing to draw f/u labs - through home health.        History of breast cancer    Overdue mammogram.  Have discussed.  Need to reschedule.  Once above stable, obtain mammogram.       Essential hypertension, benign    On metoprolol.  Follow pressures.  Follow metabolic panel.       CKD (chronic kidney disease), stage IIIa    Avoid antiinflammatories.  Stay hydrated.  Follow metabolic panel.           I discussed the assessment and treatment plan with the patient. The patient was provided an opportunity to ask questions and all were answered. The patient agreed with  the plan and demonstrated an understanding of the instructions.   The patient was advised to call back or seek an in-person evaluation if the symptoms worsen or if the condition fails to improve as anticipated.    ChEinar PheasantMD

## 2019-11-25 ENCOUNTER — Encounter: Payer: Self-pay | Admitting: Internal Medicine

## 2019-11-25 ENCOUNTER — Telehealth: Payer: Self-pay | Admitting: Internal Medicine

## 2019-11-25 DIAGNOSIS — Z9114 Patient's other noncompliance with medication regimen: Secondary | ICD-10-CM | POA: Insufficient documentation

## 2019-11-25 MED ORDER — LORAZEPAM 0.5 MG PO TABS: 0.5000 mg | ORAL_TABLET | Freq: Every day | ORAL | 0 refills | Status: DC | PRN

## 2019-11-25 NOTE — Assessment & Plan Note (Signed)
On crestor 40mg q day.  Low cholesterol diet and exercise.  Follow lipid panel and liver function tests.   ?

## 2019-11-25 NOTE — Assessment & Plan Note (Signed)
Overdue mammogram.  Have discussed.  Need to reschedule.  Once above stable, obtain mammogram.

## 2019-11-25 NOTE — Assessment & Plan Note (Signed)
Continue PT

## 2019-11-25 NOTE — Assessment & Plan Note (Signed)
Recently admitted and diagnosed with acute cva as outlined.  Neurology evaluated.  W/up as outlined.  Discussed recommendation for aspirin and plavix.  Taking plavix.  Will continue add aspirin - 81mg  q day.  Continue PT/OT.  Also have nursing to draw f/u labs - through home health.

## 2019-11-25 NOTE — Assessment & Plan Note (Signed)
On metoprolol.  Follow pressures.  Follow metabolic panel.  

## 2019-11-25 NOTE — Assessment & Plan Note (Signed)
Discussed importance of taking her medications regularly and keeping appts.  Follow.

## 2019-11-25 NOTE — Assessment & Plan Note (Signed)
Avoid antiinflammatories.  Stay hydrated.  Follow metabolic panel.   

## 2019-11-25 NOTE — Assessment & Plan Note (Signed)
On thyroid replacement.  Follow tsh.  

## 2019-11-25 NOTE — Telephone Encounter (Signed)
rx sent in for lorazepam.  Also, need home health [facility]) nursing to draw labs as discussed.  Let me know if I need to do anything more.

## 2019-11-25 NOTE — Assessment & Plan Note (Addendum)
Low carb diet and exercise.  Metformin dose adjusted when in hospital.  They were questioning increasing metformin dose.  Need sugar readings.  Old glucometer.  Needs new.  D/w pharmacy - CGM.  No on insulin.  Not sure of cost.  Discussed the need for sugar readings to help adjust medication.  Follow met b and a1c.  Will see if home health nurse can go out and draw f/u labs.

## 2019-11-25 NOTE — Assessment & Plan Note (Signed)
Has a history of depression.  Recently worsened - with admission for CVA and then discharge to rehab.  Unable to see her family, etc.  On effexor.  No suicidal ideations.  Feels needs something more to help with her depression.  D/w psychiatry.  Follow.

## 2019-11-27 ENCOUNTER — Telehealth: Payer: Self-pay

## 2019-11-27 NOTE — Chronic Care Management (AMB) (Signed)
  Chronic Care Management   Note  11/27/2019 Name: KHAYA THEISSEN MRN: [id number] DOB: [birth year]  Tawnie E Biggs is a 79 y.o. year old female who is a primary care patient of Einar Pheasant, MD. I reached out to [employer] by phone today in response to a referral sent by Ms. Ayvah E Engelmann's PCP, Dr. Nicki Reaper      Ms. Kruschke's son [name] was given information about Chronic Care Management services today including:  1. CCM service includes personalized support from designated clinical staff supervised by her physician, including individualized plan of care and coordination with other care providers 2. 24/7 contact phone numbers for assistance for urgent and routine care needs. 3. Service will only be billed when office clinical staff spend 20 minutes or more in a month to coordinate care. 4. Only one practitioner may furnish and bill the service in a calendar month. 5. The patient may stop CCM services at any time (effective at the end of the month) by phone call to the office staff. 6. The patient will be responsible for cost sharing (co-pay) of up to 20% of the service fee (after annual deductible is met).  Patient's son [name]  agreed to services and verbal consent obtained.   Follow up plan: Telephone appointment with care management team member scheduled for:12/07/2019  Noreene Larsson, [place], [place], [place] [address] Direct Dial: [phone] [email or web address] Website: [email or web address]

## 2019-11-28 NOTE — Telephone Encounter (Signed)
Gave verbals for labs and printed orders per [place] at [facility] for you to sign

## 2019-11-28 NOTE — Telephone Encounter (Signed)
Faxed to [facility]  

## 2019-11-28 NOTE — Telephone Encounter (Signed)
Signed and placed in box.   

## 2019-11-29 ENCOUNTER — Ambulatory Visit: Payer: Self-pay | Admitting: Diagnostic Neuroimaging

## 2019-12-07 ENCOUNTER — Ambulatory Visit: Payer: Medicare HMO | Admitting: Pharmacist

## 2019-12-07 DIAGNOSIS — Z8673 Personal history of transient ischemic attack (TIA), and cerebral infarction without residual deficits: Secondary | ICD-10-CM

## 2019-12-07 DIAGNOSIS — E1122 Type 2 diabetes mellitus with diabetic chronic kidney disease: Secondary | ICD-10-CM

## 2019-12-07 DIAGNOSIS — E7800 Pure hypercholesterolemia, unspecified: Secondary | ICD-10-CM

## 2019-12-07 DIAGNOSIS — N1832 Chronic kidney disease, stage 3b: Secondary | ICD-10-CM

## 2019-12-07 NOTE — Chronic Care Management (AMB) (Signed)
Chronic Care Management   Pharmacy Note  12/07/2019 Name: Joyce Maynard MRN: [id number] DOB: Aug 10, 1940   Subjective:  Joyce Maynard is a 79 y.o. year old female who is a primary care patient of Einar Pheasant, MD. The CCM team was consulted for assistance with chronic disease management and care coordination needs.    Engaged with patient by telephone for initial visit in response to provider referral for pharmacy case management and/or care coordination services. Spoke with patient's son (on [place]), [name].   Consent to Services:  Ms. Ronda son was given the following information about Chronic Care Management services today, agreed to services, and gave verbal consent: 1.CCM service includes personalized support from designated clinical staff supervised by her physician, including individualized plan of care and coordination with other care providers 2. 24/7 contact phone numbers for assistance for urgent and routine care needs. 3. Service will only be billed when office clinical staff spend 20 minutes or more in a month to coordinate care. 4. Only one practitioner may furnish and bill the service in a calendar month. 5.The patient may stop CCM services at any time (effective at the end of the month) by phone call to the office staff. 6. The patient will be responsible for cost sharing (co-pay) of up to 20% of the service fee (after annual deductible is met).  SDOH (Social Determinants of Health) assessments and interventions performed:  SDOH Interventions     Most Recent Value  SDOH Interventions  SDOH Interventions for the Following Domains Financial Strain, Depression, Stress  Financial Strain Interventions Intervention Not Indicated  Stress Interventions Provide Counseling, Other (Comment)  [discussed medication adjustment w/ PCP]  Depression Interventions/Treatment  Medication, Counseling  [discussed medication adjustment w/ PCP]       Objective:  Lab Results  Component Value Date    CREATININE 1.05 (H) 10/19/2019   CREATININE 1.12 (H) 10/17/2019   CREATININE 0.99 01/17/2019    Lab Results  Component Value Date   HGBA1C 7.3 (H) 10/17/2019       Component Value Date/Time   CHOL 214 (H) 10/18/2019 0416   TRIG 211 (H) 10/18/2019 0416   HDL 39 (L) 10/18/2019 0416   CHOLHDL 5.5 10/18/2019 0416   VLDL 42 (H) 10/18/2019 0416   LDLCALC 133 (H) 10/18/2019 0416   LDLDIRECT 109.9 11/01/2012 0943    Lab Results  Component Value Date   TSH 44.545 (H) 10/17/2019    BP Readings from Last 3 Encounters:  10/23/19 119/65  07/20/17 134/78  11/10/16 124/84    Assessment/Interventions: Review of patient past medical history, allergies, medications, health status, including review of consultants reports, laboratory and other test data, was performed as part of comprehensive evaluation and provision of chronic care management services.   No Known Allergies  Medications Reviewed Today    Reviewed by De Hollingshead, RPH-CPP (Pharmacist) on 12/07/19 at 1458  Med List Status: <None>  Medication Order Taking? Sig Documenting Provider Last Dose Status Informant  acetaminophen (TYLENOL) 500 MG tablet [id number] No Take 1,000 mg by mouth every 6 (six) hours as needed for mild pain.  Patient not taking: Reported on 12/07/2019   [provider] Not Taking Active Child  aspirin EC 81 MG tablet [id number] Yes Take 81 mg by mouth daily. Swallow whole. [provider] Taking Active   BD ULTRA-FINE LANCETS lancets [id number] No Test blood glucose BID DX code 250.00  Patient not taking: Reported on 12/07/2019   Einar Pheasant, MD Not Taking Active  Child  clopidogrel (PLAVIX) 75 MG tablet [id number] Yes Take 1 tablet (75 mg total) by mouth daily. Kayleen Memos, DO Taking Active   cyanocobalamin (,VITAMIN B-12,) 1000 MCG/ML injection [id number] No Inject 1 mL (1,000 mcg total) into the muscle every 30 (thirty) days.  Patient not taking: Reported on 10/17/2019   Einar Pheasant, MD Not Taking Active Child  EUTHYROX 175 MCG tablet [id number] Yes Take 1 tablet by mouth once daily Einar Pheasant, MD Taking Active Child  glimepiride (AMARYL) 2 MG tablet [id number] Yes Take 1 tablet (2 mg total) by mouth 2 (two) times daily.  Patient taking differently: Take 2 mg by mouth daily with breakfast.    Crecencio Mc, MD Taking Active Child  glucose blood (ONE TOUCH ULTRA TEST) test strip [id number] Yes Test blood sugar BID Dx Code:  250.00 Pt uses Contour Meter Einar Pheasant, MD Taking Active Child  LORazepam (ATIVAN) 0.5 MG tablet [id number] Yes Take 1 tablet (0.5 mg total) by mouth daily as needed for anxiety. Einar Pheasant, MD Taking Active   metFORMIN (GLUCOPHAGE XR) 500 MG 24 hr tablet [id number] Yes Two tablets bid Crecencio Mc, MD Taking Active Child  metoprolol succinate (TOPROL-XL) 25 MG 24 hr tablet [id number] Yes Take 1 tablet (25 mg total) by mouth daily. Kayleen Memos, DO Taking Active   pantoprazole (PROTONIX) 40 MG tablet [id number] Yes TAKE 1 TABLET BY MOUTH TWICE DAILY WITH A MEAL Einar Pheasant, MD Taking Active Child           Med Note Nat Christen Dec 07, 2019  2:57 PM) Once daily   rosuvastatin (CRESTOR) 40 MG tablet [id number] Yes Take 1 tablet (40 mg total) by mouth daily. Kayleen Memos, DO Taking Active   SYRINGE-NEEDLE, DISP, 3 ML (LUER LOCK SAFETY SYRINGES) 25G X 1" 3 ML MISC [id number]  Use to inject B12 q 30 days Einar Pheasant, MD  Active Child  venlafaxine XR (EFFEXOR-XR) 150 MG 24 hr capsule [id number] Yes Take 1 capsule by mouth once daily Einar Pheasant, MD Taking Active Child          Patient Active Problem List   Diagnosis Date Noted  . History of medication noncompliance 11/25/2019  . History of CVA (cerebrovascular accident) 10/18/2019  . Stroke ([place]) 10/17/2019  . Anxiety 10/17/2019  . Compression fracture of thoracic vertebra ([place]) 10/17/2019  . Nausea vomiting and diarrhea 10/17/2019  . Hypokalemia  10/17/2019  . Weakness   . CKD (chronic kidney disease), stage IIIa 12/12/2018  . Left breast lump 09/10/2015  . Health care maintenance 10/05/2014  . Mild depression ([place]) 04/16/2014  . Other malaise and fatigue 09/20/2013  . Leg pain 01/18/2013  . Essential hypertension, benign 04/18/2012  . Type II diabetes mellitus with renal manifestations ([place]) 04/18/2012  . Hypercholesterolemia 04/18/2012  . History of breast cancer 04/18/2012  . Hypothyroidism 04/18/2012  . GERD (gastroesophageal reflux disease) 04/18/2012    Medication Assistance: None required. Patient affirms current coverage meets needs.   Patient Care Plan: Medication Management    Problem Identified: Diabetes, recent hx CVA, HTN, CKD     Long-Range Goal: Disease Progression Prevention   This Visit's Progress: On track  Priority: High  Note:   Current Barriers:  . Unable to independently monitor therapeutic efficacy . Unable to achieve control of diabetes, cholesterol   Pharmacist Clinical Goal(s):  Marland Kitchen Over the next 90 days, patient will improve control of diabetes as evidenced by improvement in A1c  through collaboration with PharmD and provider  Interventions: . Inter-disciplinary care team collaboration (see longitudinal plan of care) . Comprehensive medication review performed; medication list updated in electronic medical record  Health Management: . Patient and her husband have numerous children/in laws that come by daily (~7-11 am) and often in the evenings (6-7 pm) to help with medication reminders and meals. [name] notes that he manages his parent's medications. He notes that mornings are the most reliable time for medication administration . Denies cost concerns.  . Discussed taking all medications QAM, as this is a more reliable administration time for the family: metformin XR 1000 mg, metoprolol succinate 25 mg, levothyroxine 175 mcg, pantoprazole 40 mg, rosuvastatin 40 mg, venlafaxine 150 mg, aspirin 81  mg, clopidogrel 75 mg  . Collaborated w/ clinical staff to coordinate lab work, appointments, and education visits at the same time   Diabetes: . Uncontrolled: current treatment: prescribed metformin XR 1000 mg BID and glimepiride 2 mg BID, but son, [name], notes that patient often forgets evening doses of medications.   . Reports occasional complaints of "feeling funny", unsure if hypoglycemic episodes or note.  . Not checking glucoses regularly at home. [name] inquires about [employer] CGM.  Marland Kitchen Current meal patterns: Breakfast: protein breakfast bar, fruit; sometimes grits, oatmeal, water; occasional juice; Supper: Family cooks and brings; sometimes frozen meals, water; Snacks: occasionally PB crackers, banana sandwich;  . Educated on goal A1c given recent stroke.  Given need for improved glucose control and patient's inability to remember to check glucose regularly, CGM would be clinically beneficial for this patient and her family. Will submit order for [facility]. Discussed that we could set them up with a sensor/reader sample to get started.  . Goal of patient taking all medications QAM. Discussed that given recent eGFR ~45-50, continue metformin XR 1000 mg QAM for now, until f/u labs next week. Discontinue glimepiride due to risk of hypoglycemia in patient who lives independently. Will evaluate glucose patterns via CGM and determine treatment options.  . Discussed principals of portion control.   Hypertension: . Controlled; current treatment: metoprolol succinate 25 mg daily  . Recommended to continue current regimen  Hyperlipidemia s/p CVA . Uncontrolled but likely improved; current treatment: rosuvastatin 40 mg daily . [name] concerned about things he's heard about statins. Worries that his mother is already weak. Notes groin pain that worsens with activity.   . Antiplatelet therapy: aspirin 81 mg daily + clopidogrel 75 mg daily. Denies recent bleed issues.  . Educated on typical statin associated  muscle symptoms.  Unlikely that groin pain (as unilateral and worsened with movement) is unlikely related to statin therapy, encouraged to discuss w/ PCP next week.  . Encouraged continued statin therapy. Discussed importance of lipid control in light of recent CVA.  Depression/Anxiety: . Not well controlled at this time; current treatment: venlafaxine ER 150 mg daily, lorazepam 0.5 mg daily PRN anxiety. [name] notes that patient requests lorazepam daily. Notes worsened symptoms of depression and anxiety since recent hospitalization and SNF stay.  . Encouraged discussion w/ Dr. Nicki Reaper next week. Discussed increasing venlafaxine vs switch to alternative SSRI/SNRI (avoid paroxetine in elderly). Discussed goal to reduce need for lorazepam given age, falls risk. [name] verbalized understanding  Hypothyroidism: . Uncontrolled but suspected improved with recent adherence; current treatment: levothyroxine 175 mcg daily . Recommended to continue current regimen at this time  GERD: . Controlled; current treatment: pantoprazole 40 mg QAM (prescribed BID, but only taking QAM) . Recommended to continue current regimen.  Patient Goals/Self-Care Activities . Over the next 90 days, patient will:  - take medications as prescribed check blood glucose TID using CGM, document, and provide at future appointments  Follow Up Plan: Telephone follow up appointment with care management team member scheduled for: ~ 6 weeks       Plan: Telephone follow up appointment with care management team member scheduled for:~ 6 weeks  Catie Darnelle Maffucci, PharmD, [place], CPP Clinical Pharmacist [facility] [phone]

## 2019-12-07 NOTE — Patient Instructions (Signed)
Visit Information  Patient Care Plan: Medication Management    Problem Identified: Diabetes, recent hx CVA, HTN, CKD     Long-Range Goal: Disease Progression Prevention   This Visit's Progress: On track  Priority: High  Note:   Current Barriers:  . Unable to independently monitor therapeutic efficacy . Unable to achieve control of diabetes, cholesterol   Pharmacist Clinical Goal(s):  Marland Kitchen Over the next 90 days, patient will improve control of diabetes as evidenced by improvement in A1c through collaboration with PharmD and provider  Interventions: . Inter-disciplinary care team collaboration (see longitudinal plan of care) . Comprehensive medication review performed; medication list updated in electronic medical record  Health Management: . Patient and her husband have numerous children/in laws that come by daily (~7-11 am) and often in the evenings (6-7 pm) to help with medication reminders and meals. Caryn Bee notes that he manages his parent's medications. He notes that mornings are the most reliable time for medication administration . Denies cost concerns.  . Discussed taking all medications QAM, as this is a more reliable administration time for the family: metformin XR 1000 mg, metoprolol succinate 25 mg, levothyroxine 175 mcg, pantoprazole 40 mg, rosuvastatin 40 mg, venlafaxine 150 mg, aspirin 81 mg, clopidogrel 75 mg  . Collaborated w/ clinical staff to coordinate lab work, appointments, and education visits at the same time   Diabetes: . Uncontrolled: current treatment: prescribed metformin XR 1000 mg BID and glimepiride 2 mg BID, but son, Caryn Bee, notes that patient often forgets evening doses of medications.   . Reports occasional complaints of "feeling funny", unsure if hypoglycemic episodes or note.  . Not checking glucoses regularly at home. Caryn Bee inquires about [employer] CGM.  Marland Kitchen Current meal patterns: Breakfast: protein breakfast bar, fruit; sometimes grits, oatmeal, water;  occasional juice; Supper: Family cooks and brings; sometimes frozen meals, water; Snacks: occasionally PB crackers, banana sandwich;  . Educated on goal A1c given recent stroke.  Given need for improved glucose control and patient's inability to remember to check glucose regularly, CGM would be clinically beneficial for this patient and her family. Will submit order for [facility]. Discussed that we could set them up with a sensor/reader sample to get started.  . Goal of patient taking all medications QAM. Discussed that given recent eGFR ~45-50, continue metformin XR 1000 mg QAM for now, until f/u labs next week. Discontinue glimepiride due to risk of hypoglycemia in patient who lives independently. Will evaluate glucose patterns via CGM and determine treatment options.  . Discussed principals of portion control.   Hypertension: . Controlled; current treatment: metoprolol succinate 25 mg daily  . Recommended to continue current regimen  Hyperlipidemia s/p CVA . Uncontrolled but likely improved; current treatment: rosuvastatin 40 mg daily . Caryn Bee concerned about things he's heard about statins. Worries that his mother is already weak. Notes groin pain that worsens with activity.   . Antiplatelet therapy: aspirin 81 mg daily + clopidogrel 75 mg daily. Denies recent bleed issues.  . Educated on typical statin associated muscle symptoms.  Unlikely that groin pain (as unilateral and worsened with movement) is unlikely related to statin therapy, encouraged to discuss w/ PCP next week.  . Encouraged continued statin therapy. Discussed importance of lipid control in light of recent CVA.  Depression/Anxiety: . Not well controlled at this time; current treatment: venlafaxine ER 150 mg daily, lorazepam 0.5 mg daily PRN anxiety. Caryn Bee notes that patient requests lorazepam daily. Notes worsened symptoms of depression and anxiety since recent hospitalization and SNF  stay.  . Encouraged discussion w/ Dr. Nicki Reaper next  week. Discussed increasing venlafaxine vs switch to alternative SSRI/SNRI (avoid paroxetine in elderly). Discussed goal to reduce need for lorazepam given age, falls risk. [name] verbalized understanding  Hypothyroidism: . Uncontrolled but suspected improved with recent adherence; current treatment: levothyroxine 175 mcg daily . Recommended to continue current regimen at this time  GERD: . Controlled; current treatment: pantoprazole 40 mg QAM (prescribed BID, but only taking QAM) . Recommended to continue current regimen.   Patient Goals/Self-Care Activities . Over the next 90 days, patient will:  - take medications as prescribed check blood glucose TID using CGM, document, and provide at future appointments  Follow Up Plan: Telephone follow up appointment with care management team member scheduled for: ~ 6 weeks      Ms. Glassburn was given information about Chronic Care Management services today including:  1. CCM service includes personalized support from designated clinical staff supervised by her physician, including individualized plan of care and coordination with other care providers 2. 24/7 contact phone numbers for assistance for urgent and routine care needs. 3. Service will only be billed when office clinical staff spend 20 minutes or more in a month to coordinate care. 4. Only one practitioner may furnish and bill the service in a calendar month. 5. The patient may stop CCM services at any time (effective at the end of the month) by phone call to the office staff. 6. The patient will be responsible for cost sharing (co-pay) of up to 20% of the service fee (after annual deductible is met).  Patient agreed to services and verbal consent obtained.   The patient verbalized understanding of instructions, educational materials, and care plan provided today and declined offer to receive copy of patient instructions, educational materials, and care plan.   Plan: Telephone follow up  appointment with care management team member scheduled for:~ 6 weeks  Catie Darnelle Maffucci, PharmD, [place], [place] Pharmacist [place] [phone]

## 2019-12-10 ENCOUNTER — Other Ambulatory Visit: Payer: Self-pay | Admitting: Internal Medicine

## 2019-12-10 DIAGNOSIS — E039 Hypothyroidism, unspecified: Secondary | ICD-10-CM

## 2019-12-10 DIAGNOSIS — I10 Essential (primary) hypertension: Secondary | ICD-10-CM

## 2019-12-10 NOTE — Progress Notes (Signed)
Ordered placed for labs.

## 2019-12-12 ENCOUNTER — Other Ambulatory Visit: Payer: Self-pay

## 2019-12-12 ENCOUNTER — Ambulatory Visit (INDEPENDENT_AMBULATORY_CARE_PROVIDER_SITE_OTHER): Payer: Medicare HMO

## 2019-12-12 ENCOUNTER — Other Ambulatory Visit (INDEPENDENT_AMBULATORY_CARE_PROVIDER_SITE_OTHER): Payer: Medicare HMO

## 2019-12-12 DIAGNOSIS — E039 Hypothyroidism, unspecified: Secondary | ICD-10-CM | POA: Diagnosis not present

## 2019-12-12 DIAGNOSIS — E1122 Type 2 diabetes mellitus with diabetic chronic kidney disease: Secondary | ICD-10-CM | POA: Diagnosis not present

## 2019-12-12 DIAGNOSIS — I10 Essential (primary) hypertension: Secondary | ICD-10-CM | POA: Diagnosis not present

## 2019-12-12 DIAGNOSIS — N1832 Chronic kidney disease, stage 3b: Secondary | ICD-10-CM

## 2019-12-12 LAB — COMPREHENSIVE METABOLIC PANEL
ALT: 16 U/L (ref 0–35)
AST: 23 U/L (ref 0–37)
Albumin: 3.9 g/dL (ref 3.5–5.2)
Alkaline Phosphatase: 64 U/L (ref 39–117)
BUN: 19 mg/dL (ref 6–23)
CO2: 25 mEq/L (ref 19–32)
Calcium: 9.3 mg/dL (ref 8.4–10.5)
Chloride: 106 mEq/L (ref 96–112)
Creatinine, Ser: 1.01 mg/dL (ref 0.40–1.20)
GFR: 52.98 mL/min — ABNORMAL LOW (ref >60.00)
Glucose, Bld: 178 mg/dL — ABNORMAL HIGH (ref 70–99)
Potassium: 4.1 mEq/L (ref 3.5–5.1)
Sodium: 141 mEq/L (ref 135–145)
Total Bilirubin: 0.6 mg/dL (ref 0.2–1.2)
Total Protein: 7.2 g/dL (ref 6.0–8.3)

## 2019-12-12 LAB — TSH: TSH: 0.08 u[IU]/mL — ABNORMAL LOW (ref 0.35–4.50)

## 2019-12-12 NOTE — Progress Notes (Signed)
Patient presented for [facility] placement and instruction. Per Providers order from 12-07-19. Patient voiced no concerns nor showed signs of distress during placement. Also instructed patient on how to record. Instructed patient that if further questions, they can call clinic to be given further direction.

## 2019-12-13 ENCOUNTER — Other Ambulatory Visit: Payer: Self-pay

## 2019-12-13 MED ORDER — LEVOTHYROXINE SODIUM 150 MCG PO TABS: 150.0000 ug | ORAL_TABLET | Freq: Every day | ORAL | 1 refills | Status: DC

## 2019-12-14 ENCOUNTER — Telehealth (INDEPENDENT_AMBULATORY_CARE_PROVIDER_SITE_OTHER): Payer: Medicare HMO | Admitting: Internal Medicine

## 2019-12-14 ENCOUNTER — Other Ambulatory Visit: Payer: Self-pay

## 2019-12-14 ENCOUNTER — Encounter: Payer: Self-pay | Admitting: Internal Medicine

## 2019-12-14 DIAGNOSIS — E1122 Type 2 diabetes mellitus with diabetic chronic kidney disease: Secondary | ICD-10-CM | POA: Diagnosis not present

## 2019-12-14 DIAGNOSIS — R1031 Right lower quadrant pain: Secondary | ICD-10-CM

## 2019-12-14 DIAGNOSIS — E538 Deficiency of other specified B group vitamins: Secondary | ICD-10-CM

## 2019-12-14 DIAGNOSIS — Z9114 Patient's other noncompliance with medication regimen: Secondary | ICD-10-CM

## 2019-12-14 DIAGNOSIS — F32A Depression, unspecified: Secondary | ICD-10-CM

## 2019-12-14 DIAGNOSIS — E7800 Pure hypercholesterolemia, unspecified: Secondary | ICD-10-CM

## 2019-12-14 DIAGNOSIS — E039 Hypothyroidism, unspecified: Secondary | ICD-10-CM | POA: Diagnosis not present

## 2019-12-14 DIAGNOSIS — N1832 Chronic kidney disease, stage 3b: Secondary | ICD-10-CM

## 2019-12-14 DIAGNOSIS — F419 Anxiety disorder, unspecified: Secondary | ICD-10-CM

## 2019-12-14 DIAGNOSIS — K219 Gastro-esophageal reflux disease without esophagitis: Secondary | ICD-10-CM

## 2019-12-14 DIAGNOSIS — I10 Essential (primary) hypertension: Secondary | ICD-10-CM

## 2019-12-14 DIAGNOSIS — F320 Major depressive disorder, single episode, mild: Secondary | ICD-10-CM

## 2019-12-14 DIAGNOSIS — Z8673 Personal history of transient ischemic attack (TIA), and cerebral infarction without residual deficits: Secondary | ICD-10-CM

## 2019-12-14 MED ORDER — CYANOCOBALAMIN 1000 MCG/ML IJ SOLN: 1000.0000 ug | INTRAMUSCULAR | 0 refills | Status: DC

## 2019-12-14 MED ORDER — "LUER LOCK SAFETY SYRINGES 25G X 1"" 3 ML MISC": 0 refills | Status: DC

## 2019-12-14 NOTE — Assessment & Plan Note (Signed)
Her son is now filling her pill box, etc.  She is more compliant with her medications.  Follow.

## 2019-12-14 NOTE — Assessment & Plan Note (Signed)
No upper symptoms reported.  Protonix.  

## 2019-12-14 NOTE — Assessment & Plan Note (Addendum)
Discussed with her today.  On effexor.  She was questioning increasing the dose of effexor.  Question if would benefit from additional medication.  D/w psychiatry.  Hold on increasing effexor.  Can add low dose SSRI (for example 10mg  citalopram).  Follow.

## 2019-12-14 NOTE — Progress Notes (Signed)
[name], female   DOB: [birth year], 79 y.o.   MRN: [id number]   Virtual Visit via video Note  This visit type was conducted due to national recommendations for restrictions regarding the COVID-19 pandemic (e.g. social distancing).  This format is felt to be most appropriate for this patient at this time.  All issues noted in this document were discussed and addressed.  No physical exam was performed (except for noted visual exam findings with Video Visits).   I connected with Joyce Maynard by a video enabled telemedicine application and verified that I am speaking with the correct person using two identifiers. Location patient: home Location provider: work  Persons participating in the virtual visit: patient, provider and pts son Joyce Maynard.   The limitations, risks, security and privacy concerns of performing an evaluation and management service by video and the availability of in person appointments have been discussed.  It has also been discussed with the patient that there may be a patient responsible charge related to this service. The patient expressed  understanding and agreed to proceed.   Reason for visit: follow up appt  HPI: Admitted 10/17/19 - 10/23/19 - with acute CVA.  MRI revealed acute 26m left thalamus CVA.  Stroke w/up as outlined in last note.  Evaluated by neurology.  Placed on aspirin and plavix.  Initially discharged to SNF.  Subsequently discharged home with PT/OT.  Using a walker to get around.  Home PT has discharged her, but she is dong some exercises at home - on her own.  She is eating.  Sleeping ok.  No chest pain or sob reported.  No abdominal pain reported.  Occasionally will have loose stools.  Takes a stool softener.  Discussed taking fiber instead to help regulate bowels.  Recently met with Catie.  Came in for CGM instructions.  Trying to get used to the monitor, etc.  Blood sugar this am - 113.  Discussed checking sugars.  She also reports pain - right  groin.  Certain position changes aggravate - if turns a certain way.  Not a persistent pain.  Just occurs intermittently.  Increased depression and some anxiety.  Taking effexor.  On 1567mq day.  Was questioning increasing dose or adding something additional to help with her mood.  Blood pressures averaging <130/86-90.  Discussed recent labs.    ROS: See pertinent positives and negatives per HPI.  Past Medical History:  Diagnosis Date  . Breast cancer ([facility]   s/p chemotherapy and XRT  . Depression   . Diabetes ([place]  . GERD (gastroesophageal reflux disease)   . Hx of vertigo   . Hypercholesterolemia   . Hypertension   . Hypothyroidism   . Polio 1948   history  . PONV (postoperative nausea and vomiting)     Past Surgical History:  Procedure Laterality Date  . BREAST BIOPSY Left 2005  . BREAST BIOPSY Left 10/25/2015   [place]  . BREAST LUMPECTOMY Left 2005  . EXCISION OF BREAST LESION Left 11/19/2015   Procedure: EXCISION OF BREAST LESION/ MASS;  Surgeon: JaLeonie GreenMD;  Location: [facility] ORS;  Service: General;  Laterality: Left;  . EYE SURGERY     cataracts bilateral  . TUBAL LIGATION      Family History  Problem Relation Age of Onset  . Cancer Mother        breast  . Breast cancer Mother 7028. Stroke Father   . Breast cancer  Maternal Aunt     SOCIAL HX: reviewed.    Current Outpatient Medications:  .  acetaminophen (TYLENOL) 500 MG tablet, Take 1,000 mg by mouth every 6 (six) hours as needed for mild pain., Disp: , Rfl:  .  aspirin EC 81 MG tablet, Take 81 mg by mouth daily. Swallow whole., Disp: , Rfl:  .  BD ULTRA-FINE LANCETS lancets, Test blood glucose BID DX code 250.00, Disp: 200 each, Rfl: 1 .  clopidogrel (PLAVIX) 75 MG tablet, Take 1 tablet (75 mg total) by mouth daily., Disp: 180 tablet, Rfl: 0 .  glucose blood (ONE TOUCH ULTRA TEST) test strip, Test blood sugar BID Dx Code:  250.00 Pt uses Contour Meter, Disp: 200 each, Rfl: 1 .   levothyroxine (EUTHYROX) 150 MCG tablet, Take 1 tablet (150 mcg total) by mouth daily., Disp: 90 tablet, Rfl: 1 .  LORazepam (ATIVAN) 0.5 MG tablet, Take 1 tablet (0.5 mg total) by mouth daily as needed for anxiety., Disp: 30 tablet, Rfl: 0 .  metFORMIN (GLUCOPHAGE XR) 500 MG 24 hr tablet, Two tablets bid, Disp: 120 tablet, Rfl: 3 .  metoprolol succinate (TOPROL-XL) 25 MG 24 hr tablet, Take 1 tablet (25 mg total) by mouth daily., Disp: 90 tablet, Rfl: 0 .  pantoprazole (PROTONIX) 40 MG tablet, TAKE 1 TABLET BY MOUTH TWICE DAILY WITH A MEAL, Disp: 180 tablet, Rfl: 0 .  rosuvastatin (CRESTOR) 40 MG tablet, Take 1 tablet (40 mg total) by mouth daily., Disp: 90 tablet, Rfl: 0 .  venlafaxine XR (EFFEXOR-XR) 150 MG 24 hr capsule, Take 1 capsule by mouth once daily, Disp: 90 capsule, Rfl: 0 .  cyanocobalamin (,VITAMIN B-12,) 1000 MCG/ML injection, Inject 1 mL (1,000 mcg total) into the muscle every 30 (thirty) days., Disp: 12 mL, Rfl: 0 .  SYRINGE-NEEDLE, DISP, 3 ML (LUER LOCK SAFETY SYRINGES) 25G X 1" 3 ML MISC, Use to inject B12 q 30 days, Disp: 12 each, Rfl: 0  EXAM:  GENERAL: alert, oriented, appears well and in no acute distress  HEENT: atraumatic, conjunttiva clear, no obvious abnormalities on inspection of external nose and ears  NECK: normal movements of the head and neck  LUNGS: on inspection no signs of respiratory distress, breathing rate appears normal, no obvious gross SOB, gasping or wheezing  CV: no obvious cyanosis  PSYCH/NEURO: pleasant and cooperative, no obvious depression or anxiety, speech and thought processing grossly intact  ASSESSMENT AND PLAN:  Discussed the following assessment and plan:  Problem List Items Addressed This Visit    Essential hypertension, benign    On metoprolol.  Blood pressures as outlined.  Follow metabolic panel.       Type II diabetes mellitus with renal manifestations (HCC)    Low carb diet and exercise.  On metformin.  Had discussed CGM.   Came in for instruction.  Trying to get used to the monitor.  Hopefully this will help with obtaining more sugar checks to follow trends and help with treatment.  Recent a1c 10/21 - 7.3.  Follow met b and a1c.  Continue f/u with Catie.        Hypercholesterolemia    On crestor 59m q day.  Low cholesterol diet and exercise.  Follow lipid panel and liver function tests.        Hypothyroidism    On thyroid replacement.  Just adjusted dose.  Follow tsh.       GERD (gastroesophageal reflux disease)    No upper symptoms reported.  Protonix.  Mild depression ([place])    Discussed with her today.  On effexor.  She was questioning increasing the dose of effexor.  Question if would benefit from additional medication.  D/w psychiatry.  Hold on increasing effexor.  Can add low dose SSRI (for example 48m citalopram).  Follow.       CKD (chronic kidney disease), stage IIIa    Avoid antiinflammatories.  Stay hydrated.  Follow.        Anxiety    D/w psychiatry as outlined.        History of CVA (cerebrovascular accident)    Continue aspirin and plavix.  Continue home exercise.  Follow.       History of medication noncompliance    Her son is now filling her pill box, etc.  She is more compliant with her medications.  Follow.        Right groin pain       I discussed the assessment and treatment plan with the patient. The patient was provided an opportunity to ask questions and all were answered. The patient agreed with the plan and demonstrated an understanding of the instructions.   The patient was advised to call back or seek an in-person evaluation if the symptoms worsen or if the condition fails to improve as anticipated.    CEinar Pheasant MD

## 2019-12-14 NOTE — Assessment & Plan Note (Signed)
On crestor 40mg q day.  Low cholesterol diet and exercise.  Follow lipid panel and liver function tests.   ?

## 2019-12-14 NOTE — Assessment & Plan Note (Signed)
D/w psychiatry as outlined.

## 2019-12-14 NOTE — Assessment & Plan Note (Signed)
Continue aspirin and plavix.  Continue home exercise.  Follow.

## 2019-12-14 NOTE — Assessment & Plan Note (Signed)
Low carb diet and exercise.  On metformin.  Had discussed CGM.  Came in for instruction.  Trying to get used to the monitor.  Hopefully this will help with obtaining more sugar checks to follow trends and help with treatment.  Recent a1c 10/21 - 7.3.  Follow met b and a1c.  Continue f/u with Catie.

## 2019-12-14 NOTE — Assessment & Plan Note (Signed)
Avoid antiinflammatories.  Stay hydrated.  Follow.   

## 2019-12-14 NOTE — Assessment & Plan Note (Signed)
On metoprolol.  Blood pressures as outlined.  Follow metabolic panel.

## 2019-12-14 NOTE — Assessment & Plan Note (Signed)
On thyroid replacement.  Just adjusted dose.  Follow tsh.

## 2019-12-19 ENCOUNTER — Ambulatory Visit: Payer: Self-pay | Admitting: Diagnostic Neuroimaging

## 2019-12-24 ENCOUNTER — Telehealth: Payer: Self-pay | Admitting: Internal Medicine

## 2019-12-24 ENCOUNTER — Encounter: Payer: Self-pay | Admitting: Internal Medicine

## 2019-12-24 NOTE — Telephone Encounter (Signed)
Ms Petraitis and her son [name]) had previously expressed concern she may need something more than effexor.  Given she is on 150mg  q day, would hold on increasing dose.  I did d/w psychiatry.  They recommended low dose citalopram - 10mg  q day added to her medication regimen.  If still feels needs and agreeable, can start.  Needs a f/u appt scheduled with me in 8 weeks with fasting lab appt 1-2 days before f/u appt.

## 2019-12-25 ENCOUNTER — Other Ambulatory Visit: Payer: Self-pay

## 2019-12-25 MED ORDER — CITALOPRAM HYDROBROMIDE 10 MG PO TABS: 10.0000 mg | ORAL_TABLET | Freq: Every day | ORAL | 1 refills | Status: DC

## 2019-12-25 NOTE — Telephone Encounter (Signed)
Appt scheduled. Citalopram added.

## 2020-01-19 ENCOUNTER — Telehealth: Payer: Self-pay | Admitting: Internal Medicine

## 2020-01-19 NOTE — Telephone Encounter (Signed)
[facility] called  Wanting to discuss a bone density test for patient faxed order over on [phone] and she is faxing it again  On 01-19-20

## 2020-01-19 NOTE — Telephone Encounter (Signed)
Waiting for fax.

## 2020-01-26 DIAGNOSIS — E1165 Type 2 diabetes mellitus with hyperglycemia: Secondary | ICD-10-CM | POA: Diagnosis not present

## 2020-02-08 ENCOUNTER — Encounter: Payer: Self-pay | Admitting: *Deleted

## 2020-02-14 ENCOUNTER — Telehealth: Payer: Self-pay | Admitting: *Deleted

## 2020-02-14 DIAGNOSIS — E7800 Pure hypercholesterolemia, unspecified: Secondary | ICD-10-CM

## 2020-02-14 DIAGNOSIS — E039 Hypothyroidism, unspecified: Secondary | ICD-10-CM

## 2020-02-14 DIAGNOSIS — I10 Essential (primary) hypertension: Secondary | ICD-10-CM

## 2020-02-14 DIAGNOSIS — N1832 Chronic kidney disease, stage 3b: Secondary | ICD-10-CM

## 2020-02-14 DIAGNOSIS — E1122 Type 2 diabetes mellitus with diabetic chronic kidney disease: Secondary | ICD-10-CM

## 2020-02-14 NOTE — Telephone Encounter (Signed)
Please place future orders for lab appt.  

## 2020-02-14 NOTE — Telephone Encounter (Signed)
Orders placed for labs

## 2020-02-15 ENCOUNTER — Other Ambulatory Visit (INDEPENDENT_AMBULATORY_CARE_PROVIDER_SITE_OTHER): Payer: Medicare HMO

## 2020-02-15 ENCOUNTER — Other Ambulatory Visit: Payer: Self-pay

## 2020-02-15 DIAGNOSIS — E7800 Pure hypercholesterolemia, unspecified: Secondary | ICD-10-CM | POA: Diagnosis not present

## 2020-02-15 DIAGNOSIS — N1832 Chronic kidney disease, stage 3b: Secondary | ICD-10-CM | POA: Diagnosis not present

## 2020-02-15 DIAGNOSIS — E1122 Type 2 diabetes mellitus with diabetic chronic kidney disease: Secondary | ICD-10-CM | POA: Diagnosis not present

## 2020-02-15 DIAGNOSIS — E039 Hypothyroidism, unspecified: Secondary | ICD-10-CM | POA: Diagnosis not present

## 2020-02-15 DIAGNOSIS — I10 Essential (primary) hypertension: Secondary | ICD-10-CM

## 2020-02-15 LAB — LIPID PANEL
Cholesterol: 223 mg/dL — ABNORMAL HIGH (ref 0–200)
HDL: 49.3 mg/dL (ref >39.00)
LDL Cholesterol: 138 mg/dL — ABNORMAL HIGH (ref 0–99)
NonHDL: 173.61
Total CHOL/HDL Ratio: 5
Triglycerides: 178 mg/dL — ABNORMAL HIGH (ref 0.0–149.0)
VLDL: 35.6 mg/dL (ref 0.0–40.0)

## 2020-02-15 LAB — CBC WITH DIFFERENTIAL/PLATELET
Basophils Absolute: 0 10*3/uL (ref 0.0–0.1)
Basophils Relative: 0.6 % (ref 0.0–3.0)
Eosinophils Absolute: 0.2 10*3/uL (ref 0.0–0.7)
Eosinophils Relative: 2.9 % (ref 0.0–5.0)
HCT: 42.1 % (ref 36.0–46.0)
Hemoglobin: 14.2 g/dL (ref 12.0–15.0)
Lymphocytes Relative: 27.3 % (ref 12.0–46.0)
Lymphs Abs: 1.8 10*3/uL (ref 0.7–4.0)
MCHC: 33.8 g/dL (ref 30.0–36.0)
MCV: 99.5 fl (ref 78.0–100.0)
Monocytes Absolute: 0.6 10*3/uL (ref 0.1–1.0)
Monocytes Relative: 8.5 % (ref 3.0–12.0)
Neutro Abs: 4 10*3/uL (ref 1.4–7.7)
Neutrophils Relative %: 60.7 % (ref 43.0–77.0)
Platelets: 191 10*3/uL (ref 150.0–400.0)
RBC: 4.24 Mil/uL (ref 3.87–5.11)
RDW: 13.7 % (ref 11.5–15.5)
WBC: 6.6 10*3/uL (ref 4.0–10.5)

## 2020-02-15 LAB — HEPATIC FUNCTION PANEL
ALT: 13 U/L (ref 0–35)
AST: 16 U/L (ref 0–37)
Albumin: 4.1 g/dL (ref 3.5–5.2)
Alkaline Phosphatase: 90 U/L (ref 39–117)
Bilirubin, Direct: 0.1 mg/dL (ref 0.0–0.3)
Total Bilirubin: 0.4 mg/dL (ref 0.2–1.2)
Total Protein: 7.4 g/dL (ref 6.0–8.3)

## 2020-02-15 LAB — TSH: TSH: 0.22 u[IU]/mL — ABNORMAL LOW (ref 0.35–4.50)

## 2020-02-15 LAB — MICROALBUMIN / CREATININE URINE RATIO
Creatinine,U: 220.4 mg/dL
Microalb Creat Ratio: 1.7 mg/g (ref 0.0–30.0)
Microalb, Ur: 3.7 mg/dL — ABNORMAL HIGH (ref 0.0–1.9)

## 2020-02-15 LAB — BASIC METABOLIC PANEL
BUN: 20 mg/dL (ref 6–23)
CO2: 27 mEq/L (ref 19–32)
Calcium: 10.1 mg/dL (ref 8.4–10.5)
Chloride: 104 mEq/L (ref 96–112)
Creatinine, Ser: 1.2 mg/dL (ref 0.40–1.20)
GFR: 43.03 mL/min — ABNORMAL LOW (ref >60.00)
Glucose, Bld: 89 mg/dL (ref 70–99)
Potassium: 4.7 mEq/L (ref 3.5–5.1)
Sodium: 141 mEq/L (ref 135–145)

## 2020-02-15 LAB — HEMOGLOBIN A1C: Hgb A1c MFr Bld: 5.9 % (ref 4.6–6.5)

## 2020-02-16 ENCOUNTER — Other Ambulatory Visit: Payer: Self-pay

## 2020-02-16 MED ORDER — LEVOTHYROXINE SODIUM 125 MCG PO TABS
125.0000 ug | ORAL_TABLET | Freq: Every day | ORAL | 1 refills | Status: DC
Start: 2020-02-16 — End: 2020-03-11

## 2020-02-19 ENCOUNTER — Telehealth (INDEPENDENT_AMBULATORY_CARE_PROVIDER_SITE_OTHER): Payer: Medicare HMO | Admitting: Internal Medicine

## 2020-02-19 DIAGNOSIS — E039 Hypothyroidism, unspecified: Secondary | ICD-10-CM | POA: Diagnosis not present

## 2020-02-19 DIAGNOSIS — K219 Gastro-esophageal reflux disease without esophagitis: Secondary | ICD-10-CM | POA: Diagnosis not present

## 2020-02-19 DIAGNOSIS — Z8673 Personal history of transient ischemic attack (TIA), and cerebral infarction without residual deficits: Secondary | ICD-10-CM | POA: Diagnosis not present

## 2020-02-19 DIAGNOSIS — I10 Essential (primary) hypertension: Secondary | ICD-10-CM | POA: Diagnosis not present

## 2020-02-19 DIAGNOSIS — F32A Depression, unspecified: Secondary | ICD-10-CM

## 2020-02-19 DIAGNOSIS — E7800 Pure hypercholesterolemia, unspecified: Secondary | ICD-10-CM | POA: Diagnosis not present

## 2020-02-19 DIAGNOSIS — F320 Major depressive disorder, single episode, mild: Secondary | ICD-10-CM

## 2020-02-19 DIAGNOSIS — Z853 Personal history of malignant neoplasm of breast: Secondary | ICD-10-CM

## 2020-02-19 DIAGNOSIS — R531 Weakness: Secondary | ICD-10-CM

## 2020-02-19 DIAGNOSIS — Z8781 Personal history of (healed) traumatic fracture: Secondary | ICD-10-CM | POA: Diagnosis not present

## 2020-02-19 DIAGNOSIS — E1122 Type 2 diabetes mellitus with diabetic chronic kidney disease: Secondary | ICD-10-CM | POA: Diagnosis not present

## 2020-02-19 DIAGNOSIS — N1832 Chronic kidney disease, stage 3b: Secondary | ICD-10-CM

## 2020-02-19 DIAGNOSIS — R69 Illness, unspecified: Secondary | ICD-10-CM | POA: Diagnosis not present

## 2020-02-19 MED ORDER — ROSUVASTATIN CALCIUM 40 MG PO TABS: 40.0000 mg | ORAL_TABLET | Freq: Every day | ORAL | 1 refills | Status: DC

## 2020-02-19 NOTE — Progress Notes (Addendum)
[name], female   DOB: 04-15-1940, 80 y.o.   MRN: [id number]   Virtual Visit via video Note  This visit type was conducted due to national recommendations for restrictions regarding the COVID-19 pandemic (e.g. social distancing).  This format is felt to be most appropriate for this patient at this time.  All issues noted in this document were discussed and addressed.  No physical exam was performed (except for noted visual exam findings with Video Visits).   I connected with Lib Davern by a video enabled telemedicine application and verified that I am speaking with the correct person using two identifiers. Location patient: home Location provider: work  Persons participating in the virtual visit: patient, provider  The limitations, risks, security and privacy concerns of performing an evaluation and management service by video and the availability of in person appointments have been discussed.  It has also been discussed with the patient that there may be a patient responsible charge related to this service. The patient expressed understanding and agreed to proceed.   Reason for visit: scheduled follow up.   HPI: Admitted 10/17/19 - 10/23/19 - acute CVA.  MRI revealed acute 75m left thalamus CVA. Initially placed on aspirin and plavix.  Had PT initially.  Has been trying to get around with her walker.  Describes weakness.  Still with trouble walking.  Pain - lower back and hips.  Balance is an issue.  Back catches.  Discussed tylenol prn.  Sugars better.  14 day average 121.  No low sugars.  Blood pressures 122-126/84-88.  On effexor and low dose celexa.  Appears to be doing better on this regimen.  Breathing stable.  No chest pain reported.     ROS: See pertinent positives and negatives per HPI.  Past Medical History:  Diagnosis Date  . Breast [place]   s/p chemotherapy and XRT  . Depression   . Diabetes ([facility]   . GERD (gastroesophageal reflux disease)   . Hx of  vertigo   . Hypercholesterolemia   . Hypertension   . Hypothyroidism   . Polio 1948   history  . PONV (postoperative nausea and vomiting)     Past Surgical History:  Procedure Laterality Date  . BREAST BIOPSY Left 2005  . BREAST BIOPSY Left 10/25/2015   [place] biopsy  . BREAST LUMPECTOMY Left 2005  . EXCISION OF BREAST LESION Left 11/19/2015   Procedure: EXCISION OF BREAST LESION/ MASS;  Surgeon: JLeonie Green MD;  Location: [facility] ORS;  Service: General;  Laterality: Left;  . EYE SURGERY     cataracts bilateral  . TUBAL LIGATION      Family History  Problem Relation Age of Onset  . Cancer Mother        breast  . Breast cancer Mother 728 . Stroke Father   . Breast cancer Maternal Aunt     SOCIAL HX: reviewed.    Current Outpatient Medications:  .  acetaminophen (TYLENOL) 500 MG tablet, Take 1,000 mg by mouth every 6 (six) hours as needed for mild pain., Disp: , Rfl:  .  aspirin EC 81 MG tablet, Take 81 mg by mouth daily. Swallow whole., Disp: , Rfl:  .  BD ULTRA-FINE LANCETS lancets, Test blood glucose BID DX code 250.00, Disp: 200 each, Rfl: 1 .  citalopram (CELEXA) 10 MG tablet, Take 1 tablet (10 mg total) by mouth daily., Disp: 30 tablet, Rfl: 1 .  clopidogrel (PLAVIX) 75 MG tablet, Take  1 tablet (75 mg total) by mouth daily., Disp: 180 tablet, Rfl: 0 .  cyanocobalamin (,VITAMIN B-12,) 1000 MCG/ML injection, Inject 1 mL (1,000 mcg total) into the muscle every 30 (thirty) days., Disp: 12 mL, Rfl: 0 .  glucose blood (ONE TOUCH ULTRA TEST) test strip, Test blood sugar BID Dx Code:  250.00 Pt uses Contour Meter, Disp: 200 each, Rfl: 1 .  levothyroxine (EUTHYROX) 125 MCG tablet, Take 1 tablet (125 mcg total) by mouth daily., Disp: 30 tablet, Rfl: 1 .  LORazepam (ATIVAN) 0.5 MG tablet, Take 1 tablet (0.5 mg total) by mouth daily as needed for anxiety., Disp: 30 tablet, Rfl: 0 .  metFORMIN (GLUCOPHAGE XR) 500 MG 24 hr tablet, Two tablets bid, Disp: 120 tablet, Rfl:  3 .  metoprolol succinate (TOPROL-XL) 25 MG 24 hr tablet, Take 1 tablet (25 mg total) by mouth daily., Disp: 90 tablet, Rfl: 0 .  pantoprazole (PROTONIX) 40 MG tablet, TAKE 1 TABLET BY MOUTH TWICE DAILY WITH A MEAL, Disp: 180 tablet, Rfl: 0 .  rosuvastatin (CRESTOR) 40 MG tablet, Take 1 tablet (40 mg total) by mouth daily., Disp: 90 tablet, Rfl: 1 .  SYRINGE-NEEDLE, DISP, 3 ML (LUER LOCK SAFETY SYRINGES) 25G X 1" 3 ML MISC, Use to inject B12 q 30 days, Disp: 12 each, Rfl: 0 .  venlafaxine XR (EFFEXOR-XR) 150 MG 24 hr capsule, Take 1 capsule by mouth once daily, Disp: 90 capsule, Rfl: 0  EXAM:  VITALS per patient if applicable: 825-053/97-67  GENERAL: alert, oriented, appears well and in no acute distress  HEENT: atraumatic, conjunttiva clear, no obvious abnormalities on inspection of external nose and ears  NECK: normal movements of the head and neck  LUNGS: on inspection no signs of respiratory distress, breathing rate appears normal, no obvious gross SOB, gasping or wheezing  CV: no obvious cyanosis  PSYCH/NEURO: pleasant and cooperative, no obvious depression or anxiety, speech and thought processing grossly intact  ASSESSMENT AND PLAN:  Discussed the following assessment and plan:  Problem List Items Addressed This Visit    Essential hypertension, benign    Continue on metoprolol.  Blood pressure as outlined.  Follow pressures.  Follow metabolic panel.       Relevant Medications   rosuvastatin (CRESTOR) 40 MG tablet   GERD (gastroesophageal reflux disease)    No upper symptoms reported. On protonix.       History of breast cancer    Overdue mammogram.  Schedule.       History of compression fracture of spine    Needs bone density. Discuss.        History of CVA (cerebrovascular accident)    Continue antiplatelet therapy.  Follow.        Hypercholesterolemia    On crestor.  Low cholesterol diet and exercise.  Follow lipid panel and liver function tests.         Relevant Medications   rosuvastatin (CRESTOR) 40 MG tablet   Hypothyroidism    Recent tsh suppressed.  Synthroid decreased to 168mg q day.  Follow.        Mild depression ([place]    Doing better on effexor and citalopram.  Continue current medication regimen.  Follow.       Type II diabetes mellitus with renal manifestations ([place]    On metformin.  CGM helping with glucose control.  14 day average as outlined.  Follow met b and a1c.       Relevant Medications   rosuvastatin (CRESTOR) 40 MG  tablet   Weakness    Persistent weakness.  Having trouble with balance.  Difficulty walking.  Discussed PT.  Notify me if agreeable.           I discussed the assessment and treatment plan with the patient. The patient was provided an opportunity to ask questions and all were answered. The patient agreed with the plan and demonstrated an understanding of the instructions.   The patient was advised to call back or seek an in-person evaluation if the symptoms worsen or if the condition fails to improve as anticipated.   Einar Pheasant, MD

## 2020-02-20 ENCOUNTER — Telehealth: Payer: Self-pay | Admitting: Internal Medicine

## 2020-02-20 NOTE — Telephone Encounter (Signed)
Joyce Maynard with [facility] called and wanted to let Dr. Nicki Reaper know that Joyce Maynard  will be due in April for a DEXA because that will be the 78m mark since she had a fracture   please call Joyce Maynard at [phone]

## 2020-02-23 NOTE — Telephone Encounter (Signed)
LM for Joyce Maynard to see if patient is agreeable to bone density scan.

## 2020-02-26 DIAGNOSIS — E1165 Type 2 diabetes mellitus with hyperglycemia: Secondary | ICD-10-CM | POA: Diagnosis not present

## 2020-02-29 NOTE — Telephone Encounter (Signed)
Son is going to call back and let me know if they are interested in DEXA scan. If so, they would like to arrange DEXA scan and mammogram together.

## 2020-03-03 ENCOUNTER — Encounter: Payer: Self-pay | Admitting: Internal Medicine

## 2020-03-03 ENCOUNTER — Telehealth: Payer: Self-pay | Admitting: Internal Medicine

## 2020-03-03 NOTE — Assessment & Plan Note (Signed)
Needs bone density. Discuss.

## 2020-03-03 NOTE — Telephone Encounter (Signed)
Needs bone density and mammogram.  Are they agreeable to schedule.  If so, need to go ahead and schedule.  Also, she was having persistent issues with pain, balance - interested in restarting PT?  Also confirm medication - is she still taking aspirin and plavix?

## 2020-03-03 NOTE — Assessment & Plan Note (Signed)
Continue antiplatelet therapy.  Follow.   °

## 2020-03-03 NOTE — Assessment & Plan Note (Signed)
On crestor.  Low cholesterol diet and exercise.  Follow lipid panel and liver function tests.   

## 2020-03-03 NOTE — Assessment & Plan Note (Signed)
Recent tsh suppressed.  Synthroid decreased to 175mcg q day.  Follow.

## 2020-03-03 NOTE — Assessment & Plan Note (Signed)
Overdue mammogram.  Schedule.  °

## 2020-03-03 NOTE — Assessment & Plan Note (Addendum)
Persistent weakness.  Having trouble with balance.  Difficulty walking.  Discussed PT.  Notify me if agreeable.

## 2020-03-03 NOTE — Assessment & Plan Note (Signed)
On metformin.  CGM helping with glucose control.  14 day average as outlined.  Follow met b and a1c.

## 2020-03-03 NOTE — Assessment & Plan Note (Signed)
No upper symptoms reported.  On protonix.   

## 2020-03-03 NOTE — Assessment & Plan Note (Signed)
Doing better on effexor and citalopram.  Continue current medication regimen.  Follow.

## 2020-03-03 NOTE — Assessment & Plan Note (Signed)
Continue on metoprolol.  Blood pressure as outlined.  Follow pressures.  Follow metabolic panel.  °

## 2020-03-04 ENCOUNTER — Other Ambulatory Visit: Payer: Self-pay | Admitting: Internal Medicine

## 2020-03-04 NOTE — Telephone Encounter (Signed)
Joyce Maynard is supposed to be asking his mom about mammo and dexa scan. Will let me know. She is still taking the aspirin and plavix and is not agreeable to physical therapy.

## 2020-03-11 ENCOUNTER — Telehealth: Payer: Self-pay | Admitting: Internal Medicine

## 2020-03-11 ENCOUNTER — Other Ambulatory Visit: Payer: Self-pay

## 2020-03-11 DIAGNOSIS — E538 Deficiency of other specified B group vitamins: Secondary | ICD-10-CM

## 2020-03-11 MED ORDER — CYANOCOBALAMIN 1000 MCG/ML IJ SOLN: 1000.0000 ug | INTRAMUSCULAR | 0 refills | Status: DC

## 2020-03-11 MED ORDER — METOPROLOL SUCCINATE ER 25 MG PO TB24: 25.0000 mg | ORAL_TABLET | Freq: Every day | ORAL | 0 refills | Status: DC

## 2020-03-11 MED ORDER — CLOPIDOGREL BISULFATE 75 MG PO TABS: 75.0000 mg | ORAL_TABLET | Freq: Every day | ORAL | 0 refills | Status: AC

## 2020-03-11 MED ORDER — CITALOPRAM HYDROBROMIDE 10 MG PO TABS: 10.0000 mg | ORAL_TABLET | Freq: Every day | ORAL | 0 refills | Status: DC

## 2020-03-11 MED ORDER — ROSUVASTATIN CALCIUM 40 MG PO TABS: 40.0000 mg | ORAL_TABLET | Freq: Every day | ORAL | 1 refills | Status: DC

## 2020-03-11 MED ORDER — METFORMIN HCL ER 500 MG PO TB24: ORAL_TABLET | ORAL | 3 refills | Status: DC

## 2020-03-11 MED ORDER — VENLAFAXINE HCL ER 150 MG PO CP24
150.0000 mg | ORAL_CAPSULE | Freq: Every day | ORAL | 0 refills | Status: DC
Start: 2020-03-11 — End: 2020-07-19

## 2020-03-11 MED ORDER — "LUER LOCK SAFETY SYRINGES 25G X 1"" 3 ML MISC": 0 refills | Status: DC

## 2020-03-11 MED ORDER — LEVOTHYROXINE SODIUM 125 MCG PO TABS: 125.0000 ug | ORAL_TABLET | Freq: Every day | ORAL | 1 refills | Status: DC

## 2020-03-11 MED ORDER — GLUCOSE BLOOD VI STRP: ORAL_STRIP | 1 refills | Status: DC

## 2020-03-11 MED ORDER — CLOPIDOGREL BISULFATE 75 MG PO TABS: 75.0000 mg | ORAL_TABLET | Freq: Every day | ORAL | 0 refills | Status: DC

## 2020-03-11 MED ORDER — PANTOPRAZOLE SODIUM 40 MG PO TBEC: 40.0000 mg | DELAYED_RELEASE_TABLET | Freq: Two times a day (BID) | ORAL | 0 refills | Status: DC

## 2020-03-11 NOTE — Telephone Encounter (Signed)
Patient need the follow refills; citalopram (CELEXA) 10 MG tablet, Son requested to have a nurse go through patient's medication and refill what every medication she is currently taking. Son has a hard time keeping up with mom's  Medication.

## 2020-03-25 DIAGNOSIS — E1165 Type 2 diabetes mellitus with hyperglycemia: Secondary | ICD-10-CM | POA: Diagnosis not present

## 2020-04-13 ENCOUNTER — Other Ambulatory Visit: Payer: Self-pay | Admitting: Internal Medicine

## 2020-04-25 DIAGNOSIS — E1165 Type 2 diabetes mellitus with hyperglycemia: Secondary | ICD-10-CM | POA: Diagnosis not present

## 2020-04-30 ENCOUNTER — Encounter: Payer: Medicare HMO | Admitting: Internal Medicine

## 2020-05-21 ENCOUNTER — Other Ambulatory Visit: Payer: Self-pay | Admitting: Internal Medicine

## 2020-05-25 DIAGNOSIS — E1165 Type 2 diabetes mellitus with hyperglycemia: Secondary | ICD-10-CM | POA: Diagnosis not present

## 2020-06-15 ENCOUNTER — Other Ambulatory Visit: Payer: Self-pay | Admitting: Internal Medicine

## 2020-06-20 ENCOUNTER — Encounter: Payer: Medicare HMO | Admitting: Internal Medicine

## 2020-06-22 ENCOUNTER — Other Ambulatory Visit: Payer: Self-pay | Admitting: Internal Medicine

## 2020-06-25 DIAGNOSIS — E1165 Type 2 diabetes mellitus with hyperglycemia: Secondary | ICD-10-CM | POA: Diagnosis not present

## 2020-07-18 ENCOUNTER — Telehealth: Payer: Self-pay

## 2020-07-18 NOTE — Telephone Encounter (Signed)
Pt's son [name] called and states that pt would like 90 day supplies of all medications instead of 30. This will be more cost efficient for the pt. Pt needs refills on citalopram (CELEXA) 10 MG tablet, EUTHYROX 125 MCG tablet, and venlafaxine XR (EFFEXOR-XR) 150 MG 24 hr capsule. [facility] on [address]

## 2020-07-19 ENCOUNTER — Other Ambulatory Visit: Payer: Self-pay

## 2020-07-19 MED ORDER — LEVOTHYROXINE SODIUM 125 MCG PO TABS: 125.0000 ug | ORAL_TABLET | Freq: Every day | ORAL | 0 refills | Status: DC

## 2020-07-19 MED ORDER — CITALOPRAM HYDROBROMIDE 10 MG PO TABS: 10.0000 mg | ORAL_TABLET | Freq: Every day | ORAL | 0 refills | Status: DC

## 2020-07-19 MED ORDER — VENLAFAXINE HCL ER 150 MG PO CP24: 150.0000 mg | ORAL_CAPSULE | Freq: Every day | ORAL | 0 refills | Status: DC

## 2020-07-19 NOTE — Telephone Encounter (Signed)
Medication has been refilled.

## 2020-07-25 DIAGNOSIS — E1165 Type 2 diabetes mellitus with hyperglycemia: Secondary | ICD-10-CM | POA: Diagnosis not present

## 2020-08-19 ENCOUNTER — Ambulatory Visit (INDEPENDENT_AMBULATORY_CARE_PROVIDER_SITE_OTHER): Payer: Medicare HMO

## 2020-08-19 VITALS — Ht 67.0 in | Wt 200.0 lb

## 2020-08-19 DIAGNOSIS — Z0000 Encounter for general adult medical examination without abnormal findings: Secondary | ICD-10-CM

## 2020-08-19 NOTE — Patient Instructions (Addendum)
  Joyce Maynard , Thank you for taking time to come for your Medicare Wellness Visit. I appreciate your ongoing commitment to your health goals. Please review the following plan we discussed and let me know if I can assist you in the future.   These are the goals we discussed:  Goals      Follow up with Primary Care Provider     Keep routine maintenance scheduled appointments        This is a list of the screening recommended for you and due dates:  Health Maintenance  Topic Date Due   COVID-19 Vaccine (1) Never done   Eye exam for diabetics  Never done   Tetanus Vaccine  Never done   Complete foot exam   11/10/2017   Hemoglobin A1C  08/14/2020   Flu Shot  10/05/2020*   Urine Protein Check  02/14/2021   DEXA scan (bone density measurement)  Completed   Pneumonia vaccines  Completed   HPV Vaccine  Aged Out   Zoster (Shingles) Vaccine  Discontinued  *Topic was postponed. The date shown is not the original due date.

## 2020-08-19 NOTE — Progress Notes (Signed)
Subjective:   Joyce Maynard is a 80 y.o. female who presents for Medicare Annual (Subsequent) preventive examination.  Review of Systems    No ROS.  Medicare Wellness Virtual Visit.  Visual/audio telehealth visit, UTA vital signs.   See social history for additional risk factors.   Cardiac Risk Factors include: advanced age (>31mn, >>70women);diabetes mellitus;hypertension     Objective:    Today's Vitals   08/19/20 1437  Weight: 200 lb (90.7 kg)  Height: '5\' 7"'$  (1.702 m)   Body mass index is 31.32 kg/m.  Advanced Directives 08/19/2020 10/18/2019 10/17/2019 08/17/2019 11/19/2015 11/15/2015 05/31/2015  Does Patient Have a Medical Advance Directive? Yes No No Yes No No No  Type of AParamedicof [place] will - - [place] Does patient want to make changes to medical advance directive? No - Patient declined - - No - Patient declined - - -  Copy of [place] Chart? Yes - validated most recent copy scanned in chart (See row information) - - Yes - validated most recent copy scanned in chart (See row information) - - -  Would patient like information on creating a medical advance directive? - No - Patient declined - - No - patient declined information No - patient declined information No - patient declined information    Current Medications (verified) Outpatient Encounter Medications as of 08/19/2020  Medication Sig   acetaminophen (TYLENOL) 500 MG tablet Take 1,000 mg by mouth every 6 (six) hours as needed for mild pain.   aspirin EC 81 MG tablet Take 81 mg by mouth daily. Swallow whole.   BD ULTRA-FINE LANCETS lancets Test blood glucose BID DX code 250.00   citalopram (CELEXA) 10 MG tablet Take 1 tablet (10 mg total) by mouth daily.   clopidogrel (PLAVIX) 75 MG tablet Take 1 tablet (75 mg total) by mouth daily.   cyanocobalamin (,VITAMIN B-12,) 1000 MCG/ML injection Inject 1 mL (1,000 mcg total) into the  muscle every 30 (thirty) days.   glucose blood (ONE TOUCH ULTRA TEST) test strip Test blood sugar BID Dx Code:  250.00 Pt uses Contour Meter   levothyroxine (EUTHYROX) 125 MCG tablet Take 1 tablet (125 mcg total) by mouth daily.   LORazepam (ATIVAN) 0.5 MG tablet Take 1 tablet (0.5 mg total) by mouth daily as needed for anxiety.   metFORMIN (GLUCOPHAGE XR) 500 MG 24 hr tablet Two tablets bid   metoprolol succinate (TOPROL-XL) 25 MG 24 hr tablet Take 1 tablet by mouth once daily   pantoprazole (PROTONIX) 40 MG tablet Take 1 tablet (40 mg total) by mouth 2 (two) times daily with a meal.   rosuvastatin (CRESTOR) 40 MG tablet Take 1 tablet (40 mg total) by mouth daily.   SYRINGE-NEEDLE, DISP, 3 ML (LUER LOCK SAFETY SYRINGES) 25G X 1" 3 ML MISC Use to inject B12 q 30 days   venlafaxine XR (EFFEXOR-XR) 150 MG 24 hr capsule Take 1 capsule (150 mg total) by mouth daily.   No facility-administered encounter medications on file as of 08/19/2020.    Allergies (verified) Patient has no known allergies.   History: Past Medical History:  Diagnosis Date   [place]ast cancer [place]   s/p chemotherapy and XRT   Depression    [place]    GERD (gastroesophageal reflux disease)    Hx of vertigo    Hypercholesterolemia    Hypertension    Hypothyroidism    Polio  1948   history   PONV (postoperative nausea and vomiting)    Past Surgical History:  Procedure Laterality Date   BREAST BIOPSY Left 2005   BREAST BIOPSY Left 10/25/2015   US guided biopsy   BREAST LUMPECTOMY Left 2005   EXCISION OF BREAST LESION Left 11/19/2015   Procedure: EXCISION OF BREAST LESION/ MASS;  Surgeon: Leonie Green, MD;  Location: [facility] ORS;  Service: General;  Laterality: Left;   EYE SURGERY     cataracts bilateral   TUBAL LIGATION     Family History  Problem Relation Age of Onset   Cancer Mother        breast   Breast cancer Mother 71   Stroke Father    Breast cancer Maternal Aunt    Social History    Socioeconomic History   Marital status: Married    Spouse name: Not on file   Number of children: Not on file   Years of education: Not on file   Highest education level: Not on file  Occupational History   Not on file  Tobacco Use   Smoking status: Never   Smokeless tobacco: Never  Substance and Sexual Activity   Alcohol use: No    Alcohol/week: 0.0 standard drinks   Drug use: No   Sexual activity: Never  Other Topics Concern   Not on file  Social History Narrative   Not on file   Social Determinants of Health   Financial Resource Strain: Low Risk    Difficulty of Paying Living Expenses: Not very hard  Food Insecurity: No Food Insecurity   Worried About Charity fundraiser in the Last Year: Never true   [place] in the Last Year: Never true  Transportation Needs: No Transportation Needs   Lack of Transportation (Medical): No   Lack of Transportation (Non-Medical): No  Physical Activity: Not on file  Stress: No Stress Concern Present   Feeling of Stress : Not at all  Social Connections: Unknown   Frequency of Communication with Friends and Family: Not on file   Frequency of Social Gatherings with Friends and Family: Not on file   Attends Religious Services: Not on Electrical engineer or Organizations: Not on file   Attends Archivist Meetings: Not on file   Marital Status: Married    Tobacco Counseling Counseling given: Not Answered   Clinical Intake:  Pre-visit preparation completed: Yes        Diabetes: Yes (Followed by pcp)  How often do you need to have someone help you when you read instructions, pamphlets, or other written materials from your doctor or pharmacy?: 1 - Never  Nutrition Risk Assessment: Has the patient had any N/V/D within the last 2 months?  No  Does the patient have any non-healing wounds?  No  Has the patient had any unintentional weight loss or weight gain?  No   Diabetes: Wears freestyle  [employer] and Diabetes Management: Are you having any financial strains with the device, your supplies or your medication? No .  Does the patient want to be seen by Chronic Care Management for management of their diabetes?  No  Would the patient like to be referred to a Nutritionist or for Diabetic Management?  No   Activities of Daily Living In your present state of health, do you have any difficulty performing the following activities: 08/19/2020 10/18/2019  Hearing? N N  Vision? N N  Difficulty concentrating or making decisions? N N  Walking or climbing stairs? N Y  Comment Walker in use as needed -  Dressing or bathing? N N  Doing errands, shopping? Tempie Donning  Comment Family assist -  Preparing Food and eating ? N -  Using the Toilet? N -  In the past six months, have you accidently leaked urine? N -  Do you have problems with loss of bowel control? N -  Managing your Medications? Y -  Comment Family assist -  Managing your Finances? Y -  Comment Family assist -  Housekeeping or managing your Housekeeping? Y -  Comment Family assist -  Some recent data might be hidden    Patient Care Team: Einar Pheasant, MD as PCP - General (Internal Medicine) De Hollingshead, RPH-CPP (Pharmacist)  Indicate any recent Medical Services you may have received from other than [facility] providers in the past year (date may be approximate).     Assessment:   This is a routine wellness examination for Bessy.  I connected with Charrisse today by telephone and verified that I am speaking with the correct person using two identifiers. Location patient: home Location provider: work Persons participating in the virtual visit: patient, Marine scientist.    I discussed the limitations, risks, security and privacy concerns of performing an evaluation and management service by telephone and the availability of in person appointments. The patient expressed understanding and verbally consented to this  telephonic visit.    Interactive audio and video telecommunications were attempted between this provider and patient, however failed, due to patient having technical difficulties OR patient did not have access to video capability.  We continued and completed visit with audio only.  Some vital signs may be absent or patient reported.   Hearing/Vision screen Hearing Screening - Comments:: Patient is able to hear conversational tones without difficulty. No issues reported. Vision Screening - Comments:: Followed by  Wears corrective lenses Cataract extraction, bilateral Visual acuity not assessed, virtual visit.  They have seen their ophthalmologist in the last 12 months.    Dietary issues and exercise activities discussed: Current Exercise Habits: Home exercise routine, Intensity: Mild Healthy diet Good water intake   Goals Addressed             This Visit's Progress    Follow up with Primary Care Provider       Keep routine maintenance scheduled appointments       Depression Screen [facility] 2/9 Scores 08/19/2020 02/19/2020 12/14/2019 11/24/2019 08/17/2019 12/12/2018 12/10/2015  PHQ - 2 Score 0 0 - - 0 0 0  PHQ- 9 Score - 0 - - - 0 -  Exception Documentation - - (No Data) Other- indicate reason in comment box - - -  Not completed - - - discussed pcp - - -    Fall Risk Fall Risk  08/19/2020 12/14/2019 08/17/2019 12/10/2015 10/04/2014  Falls in the past year? 0 1 1 No No  Number falls in past yr: - 1 0 - -  Injury with Fall? - 1 0 - -  Comment - - lost her footing while walking outdoors in the garden. No injury. - -  Follow up Falls evaluation completed Falls evaluation completed Falls evaluation completed - -    FALL RISK PREVENTION PERTAINING TO THE HOME: Adequate lighting in your home to reduce risk of falls? Yes   ASSISTIVE DEVICES UTILIZED TO PREVENT FALLS: Life alert? No  Use of a cane, walker or w/c? Yes ,  walker Grab bars in the bathroom? Yes  Shower chair or bench in  shower? Yes  Elevated toilet seat or a handicapped toilet? Yes   TIMED UP AND GO: Was the test performed? No .   Cognitive Function:  Patient is alert and oriented x3.    6CIT Screen 08/17/2019  What Year? 0 points  What month? 0 points  What time? 0 points    Immunizations Immunization History  Administered Date(s) Administered   Influenza, High Dose Seasonal PF 11/10/2016   Influenza,inj,Quad PF,6+ Mos 10/04/2014   Influenza-Unspecified 11/22/2006   Pneumococcal Conjugate-13 04/12/2014   Pneumococcal Polysaccharide-23 11/10/2016    TDAP status: Due, Education has been provided regarding the importance of this vaccine. Advised may receive this vaccine at local pharmacy or Health Dept. Aware to provide a copy of the vaccination record if obtained from local pharmacy or Health Dept. Verbalized acceptance and understanding. Deferred.   Health Maintenance Health Maintenance  Topic Date Due   COVID-19 Vaccine (1) Never done   OPHTHALMOLOGY EXAM  Never done   TETANUS/TDAP  Never done   FOOT EXAM  11/10/2017   HEMOGLOBIN A1C  08/14/2020   INFLUENZA VACCINE  10/05/2020 (Originally 08/05/2020)   URINE MICROALBUMIN  02/14/2021   DEXA SCAN  Completed   PNA vac Low Risk Adult  Completed   HPV VACCINES  Aged Out   Zoster Vaccines- Shingrix  Discontinued   Colorectal cancer screening: No longer required.   Mammogram- deferred per patient preference  Bone density- deferred per patient preference  Lung Cancer Screening: (Low Dose CT Chest recommended if Age 17-80 years, 30 pack-year currently smoking OR have quit w/in 15years.) does not qualify.   Hepatitis C Screening: does not qualify  Vision Screening: Recommended annual ophthalmology exams for early detection of glaucoma and other disorders of the eye.  Dental Screening: Recommended annual dental exams for proper oral hygiene  Community Resource Referral / Chronic Care Management: CRR required this visit?  No   CCM  required this visit?  No      Plan:   Keep all routine maintenance appointments.   I have personally reviewed and noted the following in the patient's chart:   Medical and social history Use of alcohol, tobacco or illicit drugs  Current medications and supplements including opioid prescriptions. Not currently taking opioid.  Functional ability and status Nutritional status Physical activity Advanced directives List of other physicians Hospitalizations, surgeries, and ER visits in previous 12 months Vitals Screenings to include cognitive, depression, and falls Referrals and appointments  In addition, I have reviewed and discussed with patient certain preventive protocols, quality metrics, and best practice recommendations. A written personalized care plan for preventive services as well as general preventive health recommendations were provided to patient via mychart.     Varney Biles, LPN   [id number]

## 2020-08-26 DIAGNOSIS — E1165 Type 2 diabetes mellitus with hyperglycemia: Secondary | ICD-10-CM | POA: Diagnosis not present

## 2020-09-15 ENCOUNTER — Other Ambulatory Visit: Payer: Self-pay | Admitting: Internal Medicine

## 2020-09-26 DIAGNOSIS — E1165 Type 2 diabetes mellitus with hyperglycemia: Secondary | ICD-10-CM | POA: Diagnosis not present

## 2020-10-13 ENCOUNTER — Other Ambulatory Visit: Payer: Self-pay | Admitting: Internal Medicine

## 2020-10-16 ENCOUNTER — Other Ambulatory Visit: Payer: Self-pay | Admitting: Internal Medicine

## 2020-10-17 ENCOUNTER — Other Ambulatory Visit: Payer: Self-pay | Admitting: Internal Medicine

## 2020-10-18 ENCOUNTER — Other Ambulatory Visit: Payer: Self-pay | Admitting: Internal Medicine

## 2020-10-26 DIAGNOSIS — E1165 Type 2 diabetes mellitus with hyperglycemia: Secondary | ICD-10-CM | POA: Diagnosis not present

## 2020-11-26 DIAGNOSIS — E1165 Type 2 diabetes mellitus with hyperglycemia: Secondary | ICD-10-CM | POA: Diagnosis not present

## 2020-12-12 ENCOUNTER — Other Ambulatory Visit: Payer: Self-pay | Admitting: Internal Medicine

## 2021-01-15 ENCOUNTER — Ambulatory Visit: Payer: Medicare HMO | Admitting: Internal Medicine

## 2021-01-17 ENCOUNTER — Other Ambulatory Visit: Payer: Self-pay | Admitting: Internal Medicine

## 2021-01-23 ENCOUNTER — Telehealth (INDEPENDENT_AMBULATORY_CARE_PROVIDER_SITE_OTHER): Payer: Medicare HMO | Admitting: Internal Medicine

## 2021-01-23 ENCOUNTER — Other Ambulatory Visit: Payer: Self-pay

## 2021-01-23 ENCOUNTER — Encounter: Payer: Self-pay | Admitting: Internal Medicine

## 2021-01-23 DIAGNOSIS — N1832 Chronic kidney disease, stage 3b: Secondary | ICD-10-CM

## 2021-01-23 DIAGNOSIS — Z8673 Personal history of transient ischemic attack (TIA), and cerebral infarction without residual deficits: Secondary | ICD-10-CM

## 2021-01-23 DIAGNOSIS — E7800 Pure hypercholesterolemia, unspecified: Secondary | ICD-10-CM | POA: Diagnosis not present

## 2021-01-23 DIAGNOSIS — I10 Essential (primary) hypertension: Secondary | ICD-10-CM

## 2021-01-23 DIAGNOSIS — K219 Gastro-esophageal reflux disease without esophagitis: Secondary | ICD-10-CM

## 2021-01-23 DIAGNOSIS — E1122 Type 2 diabetes mellitus with diabetic chronic kidney disease: Secondary | ICD-10-CM

## 2021-01-23 DIAGNOSIS — R112 Nausea with vomiting, unspecified: Secondary | ICD-10-CM

## 2021-01-23 DIAGNOSIS — Z853 Personal history of malignant neoplasm of breast: Secondary | ICD-10-CM

## 2021-01-23 DIAGNOSIS — R69 Illness, unspecified: Secondary | ICD-10-CM | POA: Diagnosis not present

## 2021-01-23 DIAGNOSIS — Z1231 Encounter for screening mammogram for malignant neoplasm of breast: Secondary | ICD-10-CM

## 2021-01-23 DIAGNOSIS — E2839 Other primary ovarian failure: Secondary | ICD-10-CM | POA: Diagnosis not present

## 2021-01-23 DIAGNOSIS — E039 Hypothyroidism, unspecified: Secondary | ICD-10-CM

## 2021-01-23 DIAGNOSIS — R197 Diarrhea, unspecified: Secondary | ICD-10-CM

## 2021-01-23 DIAGNOSIS — F32A Depression, unspecified: Secondary | ICD-10-CM

## 2021-01-23 NOTE — Progress Notes (Signed)
iPatient ID: Joyce Maynard, female   DOB: 1940/06/18, 81 y.o.   MRN: [id number]   Virtual Visit via video Note  This visit type was conducted due to national recommendations for restrictions regarding the COVID-19 pandemic (e.g. social distancing).  This format is felt to be most appropriate for this patient at this time.  All issues noted in this document were discussed and addressed.  No physical exam was performed (except for noted visual exam findings with Video Visits).   I connected with Joyce Maynard today by a video enabled telemedicine application and verified that I am speaking with the correct person using two identifiers. Location patient: home Location provider: work  Persons participating in the virtual visit: patient, provider and pts son Joyce Maynard.   The limitations, risks, security and privacy concerns of performing an evaluation and management service by video and the availability of in person appointments have been discussed.  It has also been discussed with the patient that there may be a patient responsible charge related to this service. The patient expressed understanding and agreed to proceed.   Reason for visit: scheduled follow up.   HPI: Follow up regarding her blood sugar, blood pressure, cholesterol  and history of breast cancer.  Has noticed over the last couple of days - diarrhea, nausea and decreased appetite.  Prior to this episode, had been eating well and bowels stable.  No significant increased cough.  No congestion.  One loose stool per day.  No blood.  Body aches.  She is eating.  Trying to stay hydrated.  Has lost weight.  Blood sugar has been doing ok.  Not drinking soft drinks.  No chest pain.  Breathing stable.     ROS: See pertinent positives and negatives per HPI.  Past Medical History:  Diagnosis Date   Breast cancer ([place]) 2005   s/p chemotherapy and XRT   Depression    Diabetes ([place])    GERD (gastroesophageal reflux disease)    Hx of vertigo     Hypercholesterolemia    Hypertension    Hypothyroidism    Polio 1948   history   PONV (postoperative nausea and vomiting)     Past Surgical History:  Procedure Laterality Date   BREAST BIOPSY Left 2005   BREAST BIOPSY Left 10/25/2015   US guided biopsy   BREAST LUMPECTOMY Left 2005   EXCISION OF BREAST LESION Left 11/19/2015   Procedure: EXCISION OF BREAST LESION/ MASS;  Surgeon: Leonie Green, MD;  Location: [facility] ORS;  Service: General;  Laterality: Left;   EYE SURGERY     cataracts bilateral   TUBAL LIGATION      Family History  Problem Relation Age of Onset   Cancer Mother        breast   Breast cancer Mother 74   Stroke Father    Breast cancer Maternal Aunt     SOCIAL HX: reviewed.    Current Outpatient Medications:    acetaminophen (TYLENOL) 500 MG tablet, Take 1,000 mg by mouth every 6 (six) hours as needed for mild pain., Disp: , Rfl:    aspirin EC 81 MG tablet, Take 81 mg by mouth daily. Swallow whole., Disp: , Rfl:    BD ULTRA-FINE LANCETS lancets, Test blood glucose BID DX code 250.00, Disp: 200 each, Rfl: 1   citalopram (CELEXA) 10 MG tablet, Take 1 tablet by mouth once daily, Disp: 90 tablet, Rfl: 0   cyanocobalamin (,VITAMIN B-12,) 1000 MCG/ML injection, Inject 1 mL (1,000  mcg total) into the muscle every 30 (thirty) days., Disp: 12 mL, Rfl: 0   glucose blood (ONE TOUCH ULTRA TEST) test strip, Test blood sugar BID Dx Code:  250.00 Pt uses Contour Meter, Disp: 200 each, Rfl: 1   levothyroxine (SYNTHROID) 125 MCG tablet, Take 1 tablet by mouth once daily, Disp: 90 tablet, Rfl: 0   LORazepam (ATIVAN) 0.5 MG tablet, Take 1 tablet (0.5 mg total) by mouth daily as needed for anxiety., Disp: 30 tablet, Rfl: 0   metFORMIN (GLUCOPHAGE XR) 500 MG 24 hr tablet, Two tablets bid, Disp: 120 tablet, Rfl: 3   metoprolol succinate (TOPROL-XL) 25 MG 24 hr tablet, Take 1 tablet by mouth once daily, Disp: 90 tablet, Rfl: 0   pantoprazole (PROTONIX) 40 MG tablet, Take 1  tablet (40 mg total) by mouth 2 (two) times daily with a meal., Disp: 180 tablet, Rfl: 0   rosuvastatin (CRESTOR) 40 MG tablet, Take 1 tablet (40 mg total) by mouth daily., Disp: 90 tablet, Rfl: 1   SYRINGE-NEEDLE, DISP, 3 ML (LUER LOCK SAFETY SYRINGES) 25G X 1" 3 ML MISC, Use to inject B12 q 30 days, Disp: 12 each, Rfl: 0   venlafaxine XR (EFFEXOR-XR) 150 MG 24 hr capsule, Take 1 capsule by mouth once daily, Disp: 90 capsule, Rfl: 0  EXAM:  GENERAL: alert, oriented, appears well and in no acute distress  HEENT: atraumatic, conjunttiva clear, no obvious abnormalities on inspection of external nose and ears  NECK: normal movements of the head and neck  LUNGS: on inspection no signs of respiratory distress, breathing rate appears normal, no obvious gross SOB, gasping or wheezing  CV: no obvious cyanosis  PSYCH/NEURO: pleasant and cooperative, no obvious depression or anxiety, speech and thought processing grossly intact  ASSESSMENT AND PLAN:  Discussed the following assessment and plan:  Problem List Items Addressed This Visit     CKD (chronic kidney disease), stage IIIa    Avoid antiinflammatories.  Stay hydrated.  Follow.        Essential hypertension, benign    Continue on metoprolol.  Blood pressure as outlined.  Follow pressures.  Follow metabolic panel.       GERD (gastroesophageal reflux disease)    No upper symptoms reported. On protonix.       History of breast cancer    Overdue mammogram.  Discussed.  Need to schedule.  Agreeable.  Discussed importance of f/u.       History of CVA (cerebrovascular accident)    Continue antiplatelet therapy.  Follow.        Hypercholesterolemia    On crestor.  Low cholesterol diet and exercise.  Follow lipid panel and liver function tests.        Hypothyroidism    On thyroid replacement.  Follow tsh.        Mild depression    On effexor and citalopram.  Stable.        Nausea vomiting and diarrhea    Discussed.  States  just started a couple of days ago.  Eating.  Discussed fiber and probiotics.  Follow.  Call with update.  Declines anti nausea medication.  Notify me if persistent.       Type II diabetes mellitus with renal manifestations ([place])    On metformin.  Sensor helping to avoid low sugars.  Follow met b and a1c.       Other Visit Diagnoses     Estrogen deficiency    -  Primary   Relevant  Orders   DG Bone Density   Encounter for screening mammogram for malignant neoplasm of breast       Relevant Orders   MM 3D SCREEN BREAST BILATERAL       Return in about 3 months (around 04/23/2021) for physical.   I discussed the assessment and treatment plan with the patient. The patient was provided an opportunity to ask questions and all were answered. The patient agreed with the plan and demonstrated an understanding of the instructions.   The patient was advised to call back or seek an in-person evaluation if the symptoms worsen or if the condition fails to improve as anticipated.   Einar Pheasant, MD

## 2021-01-26 ENCOUNTER — Encounter: Payer: Self-pay | Admitting: Internal Medicine

## 2021-01-26 NOTE — Assessment & Plan Note (Signed)
On crestor.  Low cholesterol diet and exercise.  Follow lipid panel and liver function tests.   

## 2021-01-26 NOTE — Assessment & Plan Note (Signed)
On thyroid replacement.  Follow tsh.  

## 2021-01-26 NOTE — Assessment & Plan Note (Signed)
On metformin.  Sensor helping to avoid low sugars.  Follow met b and a1c.

## 2021-01-26 NOTE — Assessment & Plan Note (Signed)
Avoid antiinflammatories.  Stay hydrated.  Follow.   

## 2021-01-26 NOTE — Assessment & Plan Note (Signed)
On effexor and citalopram.  Stable.

## 2021-01-26 NOTE — Assessment & Plan Note (Signed)
Continue on metoprolol.  Blood pressure as outlined.  Follow pressures.  Follow metabolic panel.

## 2021-01-26 NOTE — Assessment & Plan Note (Signed)
Overdue mammogram.  Discussed.  Need to schedule.  Agreeable.  Discussed importance of f/u.

## 2021-01-26 NOTE — Assessment & Plan Note (Signed)
Continue antiplatelet therapy.  Follow.

## 2021-01-26 NOTE — Assessment & Plan Note (Signed)
No upper symptoms reported.  On protonix.   

## 2021-01-26 NOTE — Assessment & Plan Note (Signed)
Discussed.  States just started a couple of days ago.  Eating.  Discussed fiber and probiotics.  Follow.  Call with update.  Declines anti nausea medication.  Notify me if persistent.

## 2021-01-29 ENCOUNTER — Telehealth: Payer: Self-pay | Admitting: Internal Medicine

## 2021-01-29 NOTE — Telephone Encounter (Signed)
LM for [name] to clarify what is needed.

## 2021-01-29 NOTE — Telephone Encounter (Signed)
Patients son called in regards to patients blood sugar sensor. Patients son stated [facility] [employer] faxed over a a request to provider for patients medical supplies. Patients son doesn't want to have to go another week without patients sensors.

## 2021-02-06 NOTE — Telephone Encounter (Signed)
Called Joyce Maynard. Mailbox full.

## 2021-02-18 NOTE — Telephone Encounter (Signed)
Patients son was calling to get refill on sensors for [place]. Son advised to diregard at this time because it looked like the order processed when he resubmitted.

## 2021-03-13 ENCOUNTER — Ambulatory Visit: Payer: Self-pay | Admitting: Pharmacist

## 2021-03-13 NOTE — Chronic Care Management (AMB) (Signed)
?  Chronic Care Management  ? ?Note ? ?03/13/2021 ?Name: WHITNEY HILLEGASS MRN: [id number] DOB: [birth year] ? ? ? ?Closing pharmacy CCM case at this time. Patient has clinic contact information for future questions or concerns.  ? ?Catie Darnelle Maffucci, PharmD, [place], CPP ?Clinical Pharmacist ?Therapist, music at [employer] ?[phone] ? ?

## 2021-03-21 ENCOUNTER — Telehealth: Payer: Self-pay | Admitting: Internal Medicine

## 2021-03-21 NOTE — Telephone Encounter (Signed)
Patient's son called and [facility] is waiting for a response on her Elenor [place]. She has been without for a month and she needs them. They did call [facility] and they will not ok until they hear from her provider. Please call son. ?

## 2021-03-25 NOTE — Telephone Encounter (Signed)
Have not received. Waiting for fax. ?

## 2021-03-25 NOTE — Telephone Encounter (Signed)
Lori From [employer] called in stating that they have faxed over an order for pt diabetes medical supply... Cecille Rubin stated that there office try to call us to confirm that we receive the order... Cecille Rubin stated that they couldn't get through to [place]... Cecille Rubin stated that there office mailed out the forms to [place]... Advise Cecille Rubin that it was the incorrect address.... Provided Cecille Rubin with the correct address... Refer with Cecille Rubin the correct fax number... Cecille Rubin stated that she will fax order over again.Marland KitchenMarland Kitchen  ?

## 2021-03-26 NOTE — Telephone Encounter (Signed)
Signed and placed in box.   

## 2021-03-26 NOTE — Telephone Encounter (Signed)
Faxed to [facility].  ?

## 2021-03-26 NOTE — Telephone Encounter (Signed)
Received fax from [place]. Confirmed with [name] that patient has been using the [employer] (until she ran out) and was originally started with a sample here now they get supplies through [place] out form and placed out for your signature. ?

## 2021-03-31 ENCOUNTER — Other Ambulatory Visit: Payer: Self-pay | Admitting: Internal Medicine

## 2021-04-12 ENCOUNTER — Other Ambulatory Visit: Payer: Self-pay | Admitting: Internal Medicine

## 2021-05-17 ENCOUNTER — Other Ambulatory Visit: Payer: Self-pay | Admitting: Family

## 2021-05-29 ENCOUNTER — Telehealth: Payer: Self-pay

## 2021-05-29 NOTE — Telephone Encounter (Signed)
Can we see where we stand with getting sensors.  Let me know what I need to do.

## 2021-05-29 NOTE — Telephone Encounter (Signed)
Called pt to schedule her for a follow up appt and son stated that there is no need to follow up right now because she still has not received her sensors. Son stated that once we get that figured out to call him and he will schedule the pt an appt.

## 2021-05-30 ENCOUNTER — Other Ambulatory Visit: Payer: Self-pay

## 2021-05-30 DIAGNOSIS — N1832 Chronic kidney disease, stage 3b: Secondary | ICD-10-CM

## 2021-05-30 MED ORDER — FREESTYLE LIBRE 3 SENSOR MISC
5 refills | Status: DC
  Filled 2021-05-30: qty 2, 28d supply, fill #0

## 2021-05-30 NOTE — Telephone Encounter (Signed)
Rx for [place] sensors sent to [facility] pharm to see if they can help get pt supplies sooner

## 2021-06-11 ENCOUNTER — Other Ambulatory Visit: Payer: Self-pay | Admitting: Internal Medicine

## 2021-07-01 ENCOUNTER — Other Ambulatory Visit: Payer: Self-pay | Admitting: Family

## 2021-07-10 NOTE — Telephone Encounter (Signed)
Pt daughter called in stating pt need a refill on metoprolol succinate and pantoprazole sent to [facility] [address]

## 2021-07-19 ENCOUNTER — Other Ambulatory Visit: Payer: Self-pay

## 2021-07-19 ENCOUNTER — Observation Stay: Payer: Medicare HMO

## 2021-07-19 ENCOUNTER — Emergency Department: Payer: Medicare HMO

## 2021-07-19 ENCOUNTER — Observation Stay
Admission: EM | Admit: 2021-07-19 | Discharge: 2021-07-21 | Disposition: A | Discharge status: Home / Self Care | Payer: Medicare HMO | Attending: Emergency Medicine | Admitting: Emergency Medicine

## 2021-07-19 DIAGNOSIS — R296 Repeated falls: Secondary | ICD-10-CM | POA: Insufficient documentation

## 2021-07-19 DIAGNOSIS — Z8781 Personal history of (healed) traumatic fracture: Secondary | ICD-10-CM

## 2021-07-19 DIAGNOSIS — Y92009 Unspecified place in unspecified non-institutional (private) residence as the place of occurrence of the external cause: Secondary | ICD-10-CM | POA: Insufficient documentation

## 2021-07-19 DIAGNOSIS — R2681 Unsteadiness on feet: Secondary | ICD-10-CM | POA: Insufficient documentation

## 2021-07-19 DIAGNOSIS — K219 Gastro-esophageal reflux disease without esophagitis: Secondary | ICD-10-CM | POA: Diagnosis not present

## 2021-07-19 DIAGNOSIS — M1611 Unilateral primary osteoarthritis, right hip: Secondary | ICD-10-CM | POA: Diagnosis not present

## 2021-07-19 DIAGNOSIS — E7800 Pure hypercholesterolemia, unspecified: Secondary | ICD-10-CM | POA: Diagnosis not present

## 2021-07-19 DIAGNOSIS — Z853 Personal history of malignant neoplasm of breast: Secondary | ICD-10-CM | POA: Insufficient documentation

## 2021-07-19 DIAGNOSIS — N179 Acute kidney failure, unspecified: Secondary | ICD-10-CM | POA: Diagnosis not present

## 2021-07-19 DIAGNOSIS — W19XXXA Unspecified fall, initial encounter: Secondary | ICD-10-CM | POA: Diagnosis not present

## 2021-07-19 DIAGNOSIS — S32028A Other fracture of second lumbar vertebra, initial encounter for closed fracture: Secondary | ICD-10-CM | POA: Diagnosis not present

## 2021-07-19 DIAGNOSIS — Z8673 Personal history of transient ischemic attack (TIA), and cerebral infarction without residual deficits: Secondary | ICD-10-CM

## 2021-07-19 DIAGNOSIS — M6281 Muscle weakness (generalized): Secondary | ICD-10-CM | POA: Diagnosis not present

## 2021-07-19 DIAGNOSIS — E039 Hypothyroidism, unspecified: Secondary | ICD-10-CM | POA: Diagnosis not present

## 2021-07-19 DIAGNOSIS — S22088A Other fracture of T11-T12 vertebra, initial encounter for closed fracture: Secondary | ICD-10-CM | POA: Insufficient documentation

## 2021-07-19 DIAGNOSIS — Z7984 Long term (current) use of oral hypoglycemic drugs: Secondary | ICD-10-CM | POA: Diagnosis not present

## 2021-07-19 DIAGNOSIS — M549 Dorsalgia, unspecified: Principal | ICD-10-CM | POA: Diagnosis present

## 2021-07-19 DIAGNOSIS — R7989 Other specified abnormal findings of blood chemistry: Secondary | ICD-10-CM | POA: Diagnosis not present

## 2021-07-19 DIAGNOSIS — S22000A Wedge compression fracture of unspecified thoracic vertebra, initial encounter for closed fracture: Secondary | ICD-10-CM | POA: Diagnosis not present

## 2021-07-19 DIAGNOSIS — S32018A Other fracture of first lumbar vertebra, initial encounter for closed fracture: Secondary | ICD-10-CM | POA: Insufficient documentation

## 2021-07-19 DIAGNOSIS — F32A Depression, unspecified: Secondary | ICD-10-CM | POA: Diagnosis present

## 2021-07-19 DIAGNOSIS — N183 Chronic kidney disease, stage 3 unspecified: Secondary | ICD-10-CM | POA: Diagnosis present

## 2021-07-19 DIAGNOSIS — Z7982 Long term (current) use of aspirin: Secondary | ICD-10-CM | POA: Diagnosis not present

## 2021-07-19 DIAGNOSIS — M810 Age-related osteoporosis without current pathological fracture: Secondary | ICD-10-CM | POA: Diagnosis present

## 2021-07-19 DIAGNOSIS — S22078A Other fracture of T9-T10 vertebra, initial encounter for closed fracture: Secondary | ICD-10-CM | POA: Diagnosis not present

## 2021-07-19 DIAGNOSIS — N1831 Chronic kidney disease, stage 3a: Secondary | ICD-10-CM | POA: Insufficient documentation

## 2021-07-19 DIAGNOSIS — S32048A Other fracture of fourth lumbar vertebra, initial encounter for closed fracture: Secondary | ICD-10-CM | POA: Insufficient documentation

## 2021-07-19 DIAGNOSIS — M545 Low back pain, unspecified: Secondary | ICD-10-CM

## 2021-07-19 DIAGNOSIS — R531 Weakness: Secondary | ICD-10-CM | POA: Diagnosis not present

## 2021-07-19 DIAGNOSIS — Z743 Need for continuous supervision: Secondary | ICD-10-CM | POA: Diagnosis not present

## 2021-07-19 DIAGNOSIS — F419 Anxiety disorder, unspecified: Secondary | ICD-10-CM | POA: Diagnosis present

## 2021-07-19 DIAGNOSIS — E1122 Type 2 diabetes mellitus with diabetic chronic kidney disease: Secondary | ICD-10-CM | POA: Diagnosis not present

## 2021-07-19 DIAGNOSIS — K429 Umbilical hernia without obstruction or gangrene: Secondary | ICD-10-CM | POA: Diagnosis not present

## 2021-07-19 DIAGNOSIS — S32038A Other fracture of third lumbar vertebra, initial encounter for closed fracture: Secondary | ICD-10-CM | POA: Diagnosis not present

## 2021-07-19 DIAGNOSIS — I10 Essential (primary) hypertension: Secondary | ICD-10-CM | POA: Diagnosis not present

## 2021-07-19 DIAGNOSIS — I129 Hypertensive chronic kidney disease with stage 1 through stage 4 chronic kidney disease, or unspecified chronic kidney disease: Secondary | ICD-10-CM | POA: Insufficient documentation

## 2021-07-19 DIAGNOSIS — N1832 Chronic kidney disease, stage 3b: Secondary | ICD-10-CM

## 2021-07-19 DIAGNOSIS — M47816 Spondylosis without myelopathy or radiculopathy, lumbar region: Secondary | ICD-10-CM | POA: Diagnosis not present

## 2021-07-19 DIAGNOSIS — R69 Illness, unspecified: Secondary | ICD-10-CM | POA: Diagnosis not present

## 2021-07-19 DIAGNOSIS — M4316 Spondylolisthesis, lumbar region: Secondary | ICD-10-CM | POA: Diagnosis not present

## 2021-07-19 DIAGNOSIS — E1129 Type 2 diabetes mellitus with other diabetic kidney complication: Secondary | ICD-10-CM | POA: Diagnosis present

## 2021-07-19 DIAGNOSIS — M4850XA Collapsed vertebra, not elsewhere classified, site unspecified, initial encounter for fracture: Secondary | ICD-10-CM | POA: Diagnosis present

## 2021-07-19 DIAGNOSIS — S32000A Wedge compression fracture of unspecified lumbar vertebra, initial encounter for closed fracture: Secondary | ICD-10-CM

## 2021-07-19 DIAGNOSIS — S22080A Wedge compression fracture of T11-T12 vertebra, initial encounter for closed fracture: Secondary | ICD-10-CM | POA: Diagnosis not present

## 2021-07-19 DIAGNOSIS — R0902 Hypoxemia: Secondary | ICD-10-CM | POA: Diagnosis not present

## 2021-07-19 DIAGNOSIS — R52 Pain, unspecified: Secondary | ICD-10-CM

## 2021-07-19 LAB — COMPREHENSIVE METABOLIC PANEL
ALT: 11 U/L (ref 0–44)
AST: 17 U/L (ref 15–41)
Albumin: 3.8 g/dL (ref 3.5–5.0)
Alkaline Phosphatase: 85 U/L (ref 38–126)
Anion gap: 6 (ref 5–15)
BUN: 17 mg/dL (ref 8–23)
CO2: 23 mmol/L (ref 22–32)
Calcium: 9.3 mg/dL (ref 8.9–10.3)
Chloride: 112 mmol/L — ABNORMAL HIGH (ref 98–111)
Creatinine, Ser: 0.97 mg/dL (ref 0.44–1.00)
GFR, Estimated: 59 mL/min — ABNORMAL LOW (ref >60)
Glucose, Bld: 137 mg/dL — ABNORMAL HIGH (ref 70–99)
Potassium: 4.7 mmol/L (ref 3.5–5.1)
Sodium: 141 mmol/L (ref 135–145)
Total Bilirubin: 0.5 mg/dL (ref 0.3–1.2)
Total Protein: 7.8 g/dL (ref 6.5–8.1)

## 2021-07-19 LAB — CBC WITH DIFFERENTIAL/PLATELET
Abs Immature Granulocytes: 0.01 10*3/uL (ref 0.00–0.07)
Basophils Absolute: 0 10*3/uL (ref 0.0–0.1)
Basophils Relative: 0 %
Eosinophils Absolute: 0.2 10*3/uL (ref 0.0–0.5)
Eosinophils Relative: 2 %
HCT: 40.8 % (ref 36.0–46.0)
Hemoglobin: 13.8 g/dL (ref 12.0–15.0)
Immature Granulocytes: 0 %
Lymphocytes Relative: 20 %
Lymphs Abs: 1.4 10*3/uL (ref 0.7–4.0)
MCH: 33.5 pg (ref 26.0–34.0)
MCHC: 33.8 g/dL (ref 30.0–36.0)
MCV: 99 fL (ref 80.0–100.0)
Monocytes Absolute: 0.5 10*3/uL (ref 0.1–1.0)
Monocytes Relative: 8 %
Neutro Abs: 5 10*3/uL (ref 1.7–7.7)
Neutrophils Relative %: 70 %
Platelets: 199 10*3/uL (ref 150–400)
RBC: 4.12 MIL/uL (ref 3.87–5.11)
RDW: 13.3 % (ref 11.5–15.5)
WBC: 7.1 10*3/uL (ref 4.0–10.5)
nRBC: 0 % (ref 0.0–0.2)

## 2021-07-19 LAB — CBG MONITORING, ED: Glucose-Capillary: 122 mg/dL — ABNORMAL HIGH (ref 70–99)

## 2021-07-19 MED ORDER — NALOXONE HCL 2 MG/2ML IJ SOSY
0.4000 mg | PREFILLED_SYRINGE | INTRAMUSCULAR | Status: DC | PRN
  Administered 2021-07-19: 0.4 mg via INTRAVENOUS
  Filled 2021-07-19: qty 2

## 2021-07-19 MED ORDER — CITALOPRAM HYDROBROMIDE 10 MG PO TABS
10.0000 mg | ORAL_TABLET | Freq: Every evening | ORAL | Status: DC
  Administered 2021-07-20 (×2): 10 mg via ORAL
  Filled 2021-07-19 (×2): qty 1

## 2021-07-19 MED ORDER — HYDRALAZINE HCL 20 MG/ML IJ SOLN: 5.0000 mg | Freq: Four times a day (QID) | INTRAMUSCULAR | Status: DC | PRN

## 2021-07-19 MED ORDER — METFORMIN HCL ER 500 MG PO TB24
500.0000 mg | ORAL_TABLET | Freq: Every day | ORAL | Status: DC
  Administered 2021-07-20 – 2021-07-21 (×2): 500 mg via ORAL
  Filled 2021-07-19 (×4): qty 1

## 2021-07-19 MED ORDER — MORPHINE SULFATE (PF) 2 MG/ML IV SOLN: 2.0000 mg | INTRAVENOUS | Status: DC | PRN

## 2021-07-19 MED ORDER — PANTOPRAZOLE SODIUM 40 MG PO TBEC
40.0000 mg | DELAYED_RELEASE_TABLET | Freq: Every evening | ORAL | Status: DC
  Administered 2021-07-20 (×2): 40 mg via ORAL
  Filled 2021-07-19 (×2): qty 1

## 2021-07-19 MED ORDER — VENLAFAXINE HCL ER 150 MG PO CP24
150.0000 mg | ORAL_CAPSULE | Freq: Every evening | ORAL | Status: DC
  Administered 2021-07-20 (×2): 150 mg via ORAL
  Filled 2021-07-19 (×3): qty 1

## 2021-07-19 MED ORDER — INSULIN ASPART 100 UNIT/ML IJ SOLN
0.0000 [IU] | Freq: Three times a day (TID) | INTRAMUSCULAR | Status: DC
  Administered 2021-07-20 – 2021-07-21 (×2): 3 [IU] via SUBCUTANEOUS
  Filled 2021-07-19 (×2): qty 1

## 2021-07-19 MED ORDER — MORPHINE SULFATE (PF) 4 MG/ML IV SOLN
4.0000 mg | Freq: Once | INTRAVENOUS | Status: AC
  Administered 2021-07-19: 4 mg via INTRAVENOUS
  Filled 2021-07-19: qty 1

## 2021-07-19 MED ORDER — KETOROLAC TROMETHAMINE 15 MG/ML IJ SOLN
15.0000 mg | Freq: Four times a day (QID) | INTRAMUSCULAR | Status: DC | PRN
  Administered 2021-07-20 (×3): 15 mg via INTRAVENOUS
  Filled 2021-07-19 (×3): qty 1

## 2021-07-19 MED ORDER — DOCUSATE SODIUM 100 MG PO CAPS
100.0000 mg | ORAL_CAPSULE | Freq: Two times a day (BID) | ORAL | Status: DC
  Administered 2021-07-20 – 2021-07-21 (×3): 100 mg via ORAL
  Filled 2021-07-19 (×3): qty 1

## 2021-07-19 MED ORDER — ONDANSETRON HCL 4 MG/2ML IJ SOLN
4.0000 mg | Freq: Once | INTRAMUSCULAR | Status: AC
  Administered 2021-07-19: 4 mg via INTRAVENOUS
  Filled 2021-07-19: qty 2

## 2021-07-19 MED ORDER — ACETAMINOPHEN 325 MG RE SUPP: 650.0000 mg | Freq: Four times a day (QID) | RECTAL | Status: DC | PRN

## 2021-07-19 MED ORDER — POLYETHYLENE GLYCOL 3350 17 G PO PACK: 17.0000 g | PACK | Freq: Every day | ORAL | Status: DC | PRN

## 2021-07-19 MED ORDER — ENOXAPARIN SODIUM 40 MG/0.4ML IJ SOSY
40.0000 mg | PREFILLED_SYRINGE | Freq: Every day | INTRAMUSCULAR | Status: DC
  Administered 2021-07-20 – 2021-07-21 (×2): 40 mg via SUBCUTANEOUS
  Filled 2021-07-19 (×2): qty 0.4

## 2021-07-19 MED ORDER — LORAZEPAM 2 MG/ML IJ SOLN: 1.0000 mg | Freq: Four times a day (QID) | INTRAMUSCULAR | Status: DC | PRN

## 2021-07-19 MED ORDER — INSULIN ASPART 100 UNIT/ML IJ SOLN: 0.0000 [IU] | Freq: Every day | INTRAMUSCULAR | Status: DC

## 2021-07-19 MED ORDER — ONDANSETRON HCL 4 MG PO TABS: 4.0000 mg | ORAL_TABLET | Freq: Four times a day (QID) | ORAL | Status: DC | PRN

## 2021-07-19 MED ORDER — ACETAMINOPHEN 325 MG PO TABS: 650.0000 mg | ORAL_TABLET | Freq: Four times a day (QID) | ORAL | Status: DC | PRN

## 2021-07-19 MED ORDER — METOPROLOL SUCCINATE ER 25 MG PO TB24
25.0000 mg | ORAL_TABLET | Freq: Every evening | ORAL | Status: DC
  Administered 2021-07-20 – 2021-07-21 (×2): 25 mg via ORAL
  Filled 2021-07-19 (×2): qty 1

## 2021-07-19 MED ORDER — HYDROMORPHONE HCL 1 MG/ML IJ SOLN
1.0000 mg | Freq: Once | INTRAMUSCULAR | Status: AC
  Administered 2021-07-19: 1 mg via INTRAVENOUS
  Filled 2021-07-19: qty 1

## 2021-07-19 MED ORDER — FENTANYL CITRATE PF 50 MCG/ML IJ SOSY: 25.0000 ug | PREFILLED_SYRINGE | INTRAMUSCULAR | Status: DC | PRN

## 2021-07-19 MED ORDER — ASPIRIN 81 MG PO TBEC
81.0000 mg | DELAYED_RELEASE_TABLET | Freq: Every evening | ORAL | Status: DC
  Administered 2021-07-20 (×2): 81 mg via ORAL
  Filled 2021-07-19 (×2): qty 1

## 2021-07-19 MED ORDER — ONDANSETRON HCL 4 MG/2ML IJ SOLN
4.0000 mg | Freq: Four times a day (QID) | INTRAMUSCULAR | Status: DC | PRN
  Administered 2021-07-19: 4 mg via INTRAVENOUS
  Filled 2021-07-19: qty 2

## 2021-07-19 MED ORDER — LEVOTHYROXINE SODIUM 125 MCG PO TABS
125.0000 ug | ORAL_TABLET | Freq: Every day | ORAL | Status: DC
  Administered 2021-07-20 – 2021-07-21 (×2): 125 ug via ORAL
  Filled 2021-07-19 (×2): qty 1

## 2021-07-19 NOTE — Assessment & Plan Note (Addendum)
Due to multiple compression fractures, acute at T12 and L3.  Presented with progressively worsening pain after fall 2 weeks ago. -- Neurosurgery consulted --TLSO brace when out of bed --Pain control: Scheduled Tylenol, lidocaine patch, as needed tramadol or oxycodone, IV Toradol preferred over morphine for breakthrough pain  --Narcan available if needed, was given around 9 PM on 7/15 due to reduced responsiveness, patient responded well --Bowel regimen --Limit sedating meds as much as possible

## 2021-07-19 NOTE — ED Notes (Signed)
See triage note. Pt continues to lay flat on stretcher due to pain in back; pt able to move arms and legs; when pt moves legs or shifts body on stretcher reports inc in pain level; pt calm; visitor remains with pt.

## 2021-07-19 NOTE — ED Provider Notes (Signed)
[facility] Provider Note    Event Date/Time   First MD Initiated Contact with Patient 07/19/21 1246     (approximate)   History   Back Pain   HPI  Joyce Maynard is a 81 y.o. female with history of frequent falls, hypertension, diabetes, polio, vertigo and hypothyroidism presents emergency department after a fall 11 days ago.  Patient has been able to bear weight and walk up until 2 days ago and now is little bit weightbearing.  Complaining of increased pain in the left hip and lumbar spine.  No head injury      Physical Exam   Triage Vital Signs: ED Triage Vitals  Enc Vitals Group     BP 07/19/21 1214 (!) 157/115     Pulse Rate 07/19/21 1214 86     Resp 07/19/21 1214 16     Temp 07/19/21 1214 99.2 F (37.3 C)     Temp Source 07/19/21 1214 Oral     SpO2 07/19/21 1214 100 %     Weight 07/19/21 1213 169 lb (76.7 kg)     Height 07/19/21 1213 '5\' 7"'$  (1.702 m)     Head Circumference --      Peak Flow --      Pain Score 07/19/21 1212 10     Pain Loc --      Pain Edu? --      Excl. in [place]? --     Most recent vital signs: Vitals:   07/19/21 1320 07/19/21 1322  BP:  (!) 147/73  Pulse: 72   Resp: 13   Temp:    SpO2: 100%      General: Awake, no distress.   CV:  Good peripheral perfusion. regular rate and  rhythm Resp:  Normal effort.  Abd:  No distention.   Other:  Lumbar spine and left hip are extremely tender to palpation, neurovascular intact   ED Results / Procedures / Treatments   Labs (all labs ordered are listed, but only abnormal results are displayed) Labs Reviewed  COMPREHENSIVE METABOLIC PANEL - Abnormal; Notable for the following components:      Result Value   Chloride 112 (*)    Glucose, Bld 137 (*)    GFR, Estimated 59 (*)    All other components within normal limits  CBC WITH DIFFERENTIAL/PLATELET  URINALYSIS, ROUTINE W REFLEX MICROSCOPIC     EKG     RADIOLOGY CT of the pelvis and lumbar  spine    PROCEDURES:   Procedures   MEDICATIONS ORDERED IN ED: Medications  acetaminophen (TYLENOL) tablet 650 mg (has no administration in time range)    Or  acetaminophen (TYLENOL) suppository 650 mg (has no administration in time range)  ondansetron (ZOFRAN) tablet 4 mg (has no administration in time range)    Or  ondansetron (ZOFRAN) injection 4 mg (has no administration in time range)  enoxaparin (LOVENOX) injection 40 mg (has no administration in time range)  docusate sodium (COLACE) capsule 100 mg (has no administration in time range)  polyethylene glycol (MIRALAX / GLYCOLAX) packet 17 g (has no administration in time range)  hydrALAZINE (APRESOLINE) injection 5 mg (has no administration in time range)  ketorolac (TORADOL) 15 MG/ML injection 15 mg (has no administration in time range)  morphine (PF) 2 MG/ML injection 2 mg (has no administration in time range)  fentaNYL (SUBLIMAZE) injection 25 mcg (has no administration in time range)  ondansetron (ZOFRAN) injection 4 mg (4 mg Intravenous Given 07/19/21  1317)  morphine (PF) 4 MG/ML injection 4 mg (4 mg Intravenous Given 07/19/21 1318)  HYDROmorphone (DILAUDID) injection 1 mg (1 mg Intravenous Given 07/19/21 1605)     IMPRESSION / MDM / ASSESSMENT AND PLAN / ED COURSE  I reviewed the triage vital signs and the nursing notes.                              Differential diagnosis includes, but is not limited to, lumbar spine fracture, compression fracture, left hip fracture, contusion, electrolyte imbalance  Patient's presentation is most consistent with acute presentation with potential threat to life or bodily function.   CT and labs ordered  Basic labs are reassuring.  No abnormalities noted  CT of the pelvis and lumbar spine independently reviewed and interpreted by me.  Multiple compression fractures noted.  Patient did not get proper pain control with morphine 4 mg IV.  Gave her Dilaudid 1 mg IV.  Consult  hospitalist for admission due to multiple compression fractures along with pain control Consult to neurosurgery Spoke with neurosurgery, they will consult on the floor see her most likely tomorrow Consult to hospitalist Dr. Tobie Poet is admitting for pain control.  We did order a T SLO brace.   FINAL CLINICAL IMPRESSION(S) / ED DIAGNOSES   Final diagnoses:  Compression fracture of body of thoracic vertebra (HCC)  Compression fracture of lumbar vertebra, unspecified lumbar vertebral level, initial encounter ([place])  Inadequate pain control     Rx / DC Orders   ED Discharge Orders     None        Note:  This document was prepared using Dragon voice recognition software and may include unintentional dictation errors.    Versie Starks, PA-C 07/19/21 1613    Duffy Bruce, MD 07/19/21 2112

## 2021-07-19 NOTE — ED Notes (Signed)
Ortho tech came by to place pt in TLSO brace but stated to the pt family member that the TLSO brace is only needed if the pt is going to be sitting up or walking. Pt is currently laying supine in the bed at this time. TLSO brace is on the stretcher.

## 2021-07-19 NOTE — Assessment & Plan Note (Signed)
Non-insulin-dependent.  Home regimen is metformin which is held. Sliding scale NovoLog Adjust insulin as needed for goal 140-180

## 2021-07-19 NOTE — ED Notes (Signed)
Pt is more alert after the Narcan. Oxygen removed and respirations have increased to normal. Pt is activily refusing to answer orientation questions but complains that she is cold and states that she does know her name but does not wish to tell this RN her name. MD made aware of change in level of alertness at this time.  Pt also was asked if she wished to have her night time medication tonight. Pt refused to have night time medications at this time.

## 2021-07-19 NOTE — Assessment & Plan Note (Signed)
Continue home metoprolol ?

## 2021-07-19 NOTE — Assessment & Plan Note (Signed)
Continued on home venlafaxine and citalopram Ativan as needed

## 2021-07-19 NOTE — H&P (Signed)
History and Physical   Joyce Maynard OZH:[id number] DOB: [birth year] DOA: 07/19/2021  PCP: Joyce Pheasant, MD  Patient coming from: Home via EMS  I have personally briefly reviewed patient's old medical records in [place].  Chief Concern: Back pain  HPI: [name] is a 81 year old female with history of hypothyroid, anxiety, depression, non-insulin-dependent diabetes mellitus, B12 deficiency, hyperlipidemia, GERD, hypertension, who presents emergency department for chief concerns of back pain.  Patient fell at home about 2 weeks ago and has been ambulatory however over the past 3 days, patient started to experience increased pain which inhibits her ability to move around her home freely.  This prompted her to present to the emergency department for further evaluation.  In the emergency department, vitals showed temperature of 99.2, respiration rate of 20, heart rate 69, blood pressure 144/91, SPO2 of 100% on room air.  Serum sodium is 141, potassium 4.7, hemoglobin 112, bicarb 23, BUN of 17, serum creatinine 0.97, GFR 56, nonfasting glucose 137, WBC 7.1, hemoglobin 13.8, platelets 199.  CT lumbar spine without contrast was read as: Multiple age-indeterminate superior endplate compression fractures at T11, T12, L2, L3, L4.  Moderate superior endplate compression fracture at L1 is chronic.  CT pelvis without contrast was read as no acute pelvic abnormality.  Marked right hip degenerative changes  ED treatment: Ondansetron 4 mg IV one-time dose, morphine 4 mg IV, Dilaudid 1 mg IV one-time dose.  At bedside patient was snoring.  She woke up at my loud voice however fell back asleep.  She is protecting her airway does not appear to be in acute distress.  I spoke with daughter at bedside, Joyce Maynard.  Joyce Maynard states that patient fell on 07/10/2021.  She was coming out of the bathroom and was reaching for her walker and fell onto her bottom.  Joyce Maynard denied head injury or loss of  consciousness.  Social history: Patient lives at home with her husband.  Per daughter, patient is not a tobacco or recreational drug use.  Patient infrequently drinks EtOH.  Patient is retired and formerly worked as an Therapist, sports.  ROS: Unable to complete as patient is sleeping.   ED Course: Discussed with emergency medicine provider, patient requiring hospitalization for chief concerns of pain control.  Assessment/Plan  Principal Problem:   Back pain Active Problems:   Essential hypertension, benign   Type II diabetes mellitus with renal manifestations (HCC)   Hypercholesterolemia   Hypothyroidism   GERD (gastroesophageal reflux disease)   Mild depression   CKD (chronic kidney disease), stage IIIa   Anxiety   History of compression fracture of spine   Weakness   History of CVA (cerebrovascular accident)   Assessment and Plan:  * Back pain - Presumed secondary to falling at home - Agree with TLSO brace - Symptomatic support including pain control and prophylaxis for constipation - Pain control: Acetaminophen 650 mg p.o. and rectal every 6 hours as needed for mild pain; ketorolac 15 mg IV every 6 hours as needed for moderate pain, 2 days ordered; morphine 2 mg IV every 4 hours as needed for severe pain, 4 doses ordered with instructions stating that the goal of pain control is to reduce the pain down to 50% not to completely eliminate the pain. - Fentanyl 25 mcg IV every 2 hours as needed for refractory pain not responsive to IV morphine, 3 doses ordered, with instructions to nursing staff that the goal of pain control is to reduce the pain down to  50% and not to completely eliminate the pain - Scheduled docusate sodium 100 mg p.o. twice daily for constipation prophylaxis - GlycoLax 17 g p.o. daily as needed for mild constipation ordered - Fall precaution, PT, OT, TOC - Admit to MedSurg, observation, no telemetry  History of CVA (cerebrovascular accident) - Aspirin 81 mg daily,  rosuvastatin 40 mg daily  Weakness - Presumed multifactorial including debility in setting of recent fall resulting in additional compression fracture  History of compression fracture of spine - L1 was read on CT as chronic  Anxiety - Patient takes venlafaxine Exar 150 mg p.o. daily, citalopram 10 mg daily at home ** - Ativan 1 mg IV every 6 hours as needed for anxiety, 3 doses ordered  CKD (chronic kidney disease), stage IIIa - Currently at baseline  Mild depression - Citalopram 10 mg daily and venlafaxine 150 mg daily  GERD (gastroesophageal reflux disease) - PPI  Hypothyroidism - Patient takes levothyroxine 125 mcg daily  Type II diabetes mellitus with renal manifestations ([place]) - Not insulin-dependent diabetes mellitus - Patient takes metformin 500 mg tablets, 2 tablets, p.o. twice daily - Insulin SSI with at bedtime coverage ordered - Goal inpatient blood glucose levels 140-180  Essential hypertension, benign - Metoprolol succinate 25 mg daily  Chart reviewed.   DVT prophylaxis: Enoxaparin Code Status: Full code Diet: Heart healthy/carb modified Family Communication: Updated and discussed with daughter Joyce Maynard at bedside Disposition Plan: Pending clinical course Consults called: TOC, PT, OT Admission status: MedSurg, observation, no telemetry  Past Medical History:  Diagnosis Date   Breast cancer ([place]) 2005   s/p chemotherapy and XRT   Depression    Diabetes ([place])    GERD (gastroesophageal reflux disease)    Hx of vertigo    Hypercholesterolemia    Hypertension    Hypothyroidism    Polio 1948   history   PONV (postoperative nausea and vomiting)    Past Surgical History:  Procedure Laterality Date   BREAST BIOPSY Left 2005   BREAST BIOPSY Left 10/25/2015   US guided biopsy   BREAST LUMPECTOMY Left 2005   EXCISION OF BREAST LESION Left 11/19/2015   Procedure: EXCISION OF BREAST LESION/ MASS;  Surgeon: Leonie Green, MD;  Location: [facility]  ORS;  Service: General;  Laterality: Left;   EYE SURGERY     cataracts bilateral   TUBAL LIGATION     Social History:  reports that she has never smoked. She has never used smokeless tobacco. She reports that she does not drink alcohol and does not use drugs.  No Known Allergies Family History  Problem Relation Age of Onset   Cancer Mother        breast   Breast cancer Mother 76   Stroke Father    Breast cancer Maternal Aunt    Family history: Family history reviewed and not pertinent.  Prior to Admission medications   Medication Sig Start Date End Date Taking? Authorizing Provider  acetaminophen (TYLENOL) 500 MG tablet Take 1,000 mg by mouth every 6 (six) hours as needed for mild pain.    [provider]  aspirin EC 81 MG tablet Take 81 mg by mouth daily. Swallow whole.    [provider]  BD ULTRA-FINE LANCETS lancets Test blood glucose BID DX code 250.00 08/01/13   Joyce Pheasant, MD  citalopram (CELEXA) 10 MG tablet Take 1 tablet by mouth once daily 04/14/21   Joyce Pheasant, MD  Continuous Blood Gluc Sensor (FREESTYLE LIBRE 3 SENSOR) MISC  Place 1 sensor on the skin every 14 days. Use to check glucose continuously 05/30/21   Joyce Pheasant, MD  cyanocobalamin (,VITAMIN B-12,) 1000 MCG/ML injection Inject 1 mL (1,000 mcg total) into the muscle every 30 (thirty) days. 03/11/20   Joyce Pheasant, MD  glucose blood (ONE TOUCH ULTRA TEST) test strip Test blood sugar BID Dx Code:  250.00 Pt uses Contour Meter 03/11/20   Joyce Pheasant, MD  levothyroxine (SYNTHROID) 125 MCG tablet Take 1 tablet by mouth once daily 04/14/21   Joyce Pheasant, MD  metFORMIN (GLUCOPHAGE-XR) 500 MG 24 hr tablet Take 2 tablets by mouth twice daily 06/11/21   Joyce Pheasant, MD  metoprolol succinate (TOPROL-XL) 25 MG 24 hr tablet Take 1 tablet by mouth once daily 07/17/21   Joyce Pheasant, MD  pantoprazole (PROTONIX) 40 MG tablet TAKE 1 TABLET BY MOUTH TWICE DAILY WITH A MEAL 07/17/21   Joyce Pheasant, MD  rosuvastatin (CRESTOR) 40 MG tablet Take 1 tablet (40 mg total) by mouth daily. 03/11/20 06/09/20  Joyce Pheasant, MD  SYRINGE-NEEDLE, DISP, 3 ML (LUER LOCK SAFETY SYRINGES) 25G X 1" 3 ML MISC Use to inject B12 q 30 days 03/11/20   Joyce Pheasant, MD  venlafaxine XR (EFFEXOR-XR) 150 MG 24 hr capsule Take 1 capsule by mouth once daily 04/14/21   Joyce Pheasant, MD   Physical Exam: Vitals:   07/19/21 1320 07/19/21 1322 07/19/21 1615 07/19/21 1617  BP:  (!) 147/73    Pulse: 72  70 70  Resp: '13  16 15  '$ Temp:      TempSrc:      SpO2: 100%  (!) 79% 95%  Weight:      Height:       Constitutional: appears age-appropriate, NAD, calm, comfortable Eyes: PERRL, lids and conjunctivae normal ENMT: Mucous membranes are moist. Posterior pharynx clear of any exudate or lesions. Age-appropriate dentition.  Unable to assess hearing as patient is sleeping Neck: normal, supple, no masses, no thyromegaly Respiratory: clear to auscultation bilaterally, no wheezing, no crackles. Normal respiratory effort. No accessory muscle use.  Patient is sleeping soundly, protecting airway. Cardiovascular: Regular rate and rhythm, no murmurs / rubs / gallops. No extremity edema. 2+ pedal pulses. No carotid bruits.  Abdomen: Morbidly obese abdomen, no tenderness, no masses palpated, no hepatosplenomegaly. Bowel sounds positive.  Musculoskeletal: no clubbing / cyanosis. No joint deformity upper and lower extremities. Good ROM, no contractures, no atrophy. Normal muscle tone.  Skin: no rashes, lesions, ulcers. No induration Neurologic: Sensation intact.  Unable to assess strength as patient is sleeping Psychiatric: Patient is sleeping and arousable.  Does not appear to be in acute distress.  EKG: Not indicated at this time  Chest x-ray on Admission: Not indicated at this time  CT Lumbar Spine Wo Contrast  Result Date: 07/19/2021 CLINICAL DATA:  Back pain.  Recent fall EXAM: CT LUMBAR SPINE WITHOUT CONTRAST  TECHNIQUE: Multidetector CT imaging of the lumbar spine was performed without intravenous contrast administration. Multiplanar CT image reconstructions were also generated. RADIATION DOSE REDUCTION: This exam was performed according to the departmental dose-optimization program which includes automated exposure control, adjustment of the mA and/or kV according to patient size and/or use of iterative reconstruction technique. COMPARISON:  CT 05/31/2015 FINDINGS: Segmentation: 5 lumbar type vertebrae. Alignment: Grade 1 anterolisthesis of L4 on L5 and L5 on S1 mediated by degenerative facet arthropathy. No traumatic listhesis. Vertebrae: Multiple age indeterminate superior endplate compression fractures at T11, T12, and L2. There are mild superior endplate compression  deformities of L3 and L4, also age indeterminate. Moderate superior endplate compression fracture at L1 is chronic and not appreciably changed from 2017 where there is minimal bony retropulsion at the superior endplate. Bones appear demineralized. Multiple scattered Schmorl's nodes. Paraspinal and other soft tissues: Aortoiliac atherosclerosis. No acute findings. Disc levels: Mild multilevel intervertebral disc height loss is most pronounced at the L2-3 level. Moderate-advanced lower lumbar facet arthropathy, left worse than right. No evidence of high-grade canal stenosis by CT. Multiple levels of bilateral foraminal narrowing, which appears most severe at the L3-4 and L5-S1 levels bilaterally, left worse than right. IMPRESSION: 1. Multiple age indeterminate superior endplate compression fractures at T11, T12, L2, L3 and L4. MRI could be obtained to further assess the acuity of these findings. 2. Moderate superior endplate compression fracture at L1 is chronic and not appreciably changed from 2017. 3. Lumbar spondylosis as above. 4. Aortic Atherosclerosis (ICD10-I70.0). Electronically Signed   By: Davina Poke D.O.   On: 07/19/2021 15:32   CT  PELVIS WO CONTRAST  Result Date: 07/19/2021 CLINICAL DATA:  Back pain following a recent fall. EXAM: CT PELVIS WITHOUT CONTRAST TECHNIQUE: Multidetector CT imaging of the pelvis was performed following the standard protocol without intravenous contrast. RADIATION DOSE REDUCTION: This exam was performed according to the departmental dose-optimization program which includes automated exposure control, adjustment of the mA and/or kV according to patient size and/or use of iterative reconstruction technique. COMPARISON:  Abdomen and pelvis CT dated 05/31/2015. FINDINGS: Urinary Tract:  Unremarkable urinary bladder and distal ureters. Bowel: Normal appearing appendix and included loops of small bowel and colon. Vascular/Lymphatic: Atheromatous arterial calcifications without aneurysm. No enlarged lymph nodes. Reproductive:  No mass or other significant abnormality Other:  Small umbilical hernia containing fat. Musculoskeletal: Marked right hip degenerative changes. Lower lumbar spine degenerative changes. Additional lumbar spine findings will be described separately in the lumbar spine CT report. No pelvic fracture or dislocation seen. IMPRESSION: 1. No acute pelvic abnormality. 2. Marked right hip degenerative changes. Electronically Signed   By: Claudie Revering M.D.   On: 07/19/2021 15:29    Labs on Admission: I have personally reviewed following labs  CBC: Recent Labs  Lab 07/19/21 1317  WBC 7.1  NEUTROABS 5.0  HGB 13.8  HCT 40.8  MCV 99.0  PLT 814   Basic Metabolic Panel: Recent Labs  Lab 07/19/21 1317  NA 141  K 4.7  CL 112*  CO2 23  GLUCOSE 137*  BUN 17  CREATININE 0.97  CALCIUM 9.3   GFR: Estimated Creatinine Clearance: 48.5 mL/min (by C-G formula based on SCr of 0.97 mg/dL).  Liver Function Tests: Recent Labs  Lab 07/19/21 1317  AST 17  ALT 11  ALKPHOS 85  BILITOT 0.5  PROT 7.8  ALBUMIN 3.8   Urine analysis:    Component Value Date/Time   COLORURINE YELLOW (A)  10/18/2019 0238   APPEARANCEUR CLOUDY (A) 10/18/2019 0238   LABSPEC 1.024 10/18/2019 0238   PHURINE 5.0 10/18/2019 0238   GLUCOSEU NEGATIVE 10/18/2019 0238   GLUCOSEU 100 (A) 09/23/2015 1258   HGBUR SMALL (A) 10/18/2019 0238   BILIRUBINUR NEGATIVE 10/18/2019 0238   KETONESUR NEGATIVE 10/18/2019 0238   PROTEINUR NEGATIVE 10/18/2019 0238   UROBILINOGEN 0.2 09/23/2015 1258   NITRITE NEGATIVE 10/18/2019 0238   LEUKOCYTESUR LARGE (A) 10/18/2019 0238   Dr. Tobie Poet [facility]  If 7PM-7AM, please contact overnight-coverage provider If 7AM-7PM, please contact day coverage provider [email or web address]  07/19/2021, 5:24 PM

## 2021-07-19 NOTE — Assessment & Plan Note (Signed)
Currently at baseline 

## 2021-07-19 NOTE — Assessment & Plan Note (Signed)
Continue levothyroxine 

## 2021-07-19 NOTE — Hospital Course (Addendum)
[name] is a 81 year old female with history of hypothyroid, anxiety, depression, non-insulin-dependent diabetes mellitus, B12 deficiency, hyperlipidemia, GERD, hypertension, who presents emergency department for chief concerns of back pain.  Patient fell at home about 2 weeks ago and has been ambulatory however over the past 3 days, patient started to experience increased pain which inhibits her ability to move around her home freely.  This prompted her to present to the emergency department for further evaluation.  In the emergency department, vitals showed temperature of 99.2, respiration rate of 20, heart rate 69, blood pressure 144/91, SPO2 of 100% on room air.  Serum sodium is 141, potassium 4.7, hemoglobin 112, bicarb 23, BUN of 17, serum creatinine 0.97, GFR 56, nonfasting glucose 137, WBC 7.1, hemoglobin 13.8, platelets 199.  CT lumbar spine without contrast was read as: Multiple age-indeterminate superior endplate compression fractures at T11, T12, L2, L3, L4.  Moderate superior endplate compression fracture at L1 is chronic.  CT pelvis without contrast was read as no acute pelvic abnormality.  Marked right hip degenerative changes  ED treatment: Ondansetron 4 mg IV one-time dose, morphine 4 mg IV, Dilaudid 1 mg IV one-time dose.

## 2021-07-19 NOTE — ED Notes (Signed)
Pt placed on purewick. Pt understands urine sample still needed. Visitor remains with pt.

## 2021-07-19 NOTE — Assessment & Plan Note (Signed)
Resumed on home venlafaxine and citalopram

## 2021-07-19 NOTE — ED Triage Notes (Signed)
Pt to ED [facility] from home, pt complaining of lower back pain, worsened pain since 2 days ago.  Pt had fall 11 days ago.  Pain radiates down both legs. Pt is intolerant of movement and wants to lay flat.  Pt states has hx stroke but not sure when. States has fallen 6 times in last 3 months because she loses balance. Uses walker at home but has been in too much pain to walk last 2 days. Denies new incontinence or numbness.

## 2021-07-19 NOTE — ED Notes (Addendum)
Pt is lightly sedated from the pain medication received and is alert to voice, but is not well articulated. Unable to answer orientation questions but responds to her name.

## 2021-07-19 NOTE — Assessment & Plan Note (Signed)
Continue aspirin and statin

## 2021-07-19 NOTE — ED Notes (Signed)
Secretary called to contact TLSO tech 

## 2021-07-19 NOTE — Assessment & Plan Note (Signed)
Leading to multiple falls at home.  Patient reports increased falls since her stroke resulting in additional compression fractures. -- PT OT evaluations -- Fall precautions

## 2021-07-19 NOTE — Assessment & Plan Note (Signed)
See

## 2021-07-19 NOTE — Assessment & Plan Note (Signed)
Continue PPI ?

## 2021-07-20 DIAGNOSIS — M8000XD Age-related osteoporosis with current pathological fracture, unspecified site, subsequent encounter for fracture with routine healing: Secondary | ICD-10-CM | POA: Diagnosis not present

## 2021-07-20 DIAGNOSIS — Z8673 Personal history of transient ischemic attack (TIA), and cerebral infarction without residual deficits: Secondary | ICD-10-CM | POA: Diagnosis not present

## 2021-07-20 DIAGNOSIS — R7989 Other specified abnormal findings of blood chemistry: Secondary | ICD-10-CM | POA: Diagnosis not present

## 2021-07-20 DIAGNOSIS — N179 Acute kidney failure, unspecified: Secondary | ICD-10-CM | POA: Diagnosis not present

## 2021-07-20 DIAGNOSIS — M545 Low back pain, unspecified: Secondary | ICD-10-CM | POA: Diagnosis not present

## 2021-07-20 DIAGNOSIS — M810 Age-related osteoporosis without current pathological fracture: Secondary | ICD-10-CM | POA: Diagnosis present

## 2021-07-20 DIAGNOSIS — M4850XA Collapsed vertebra, not elsewhere classified, site unspecified, initial encounter for fracture: Secondary | ICD-10-CM | POA: Diagnosis present

## 2021-07-20 LAB — URINALYSIS, ROUTINE W REFLEX MICROSCOPIC
Bilirubin Urine: NEGATIVE
Glucose, UA: NEGATIVE mg/dL
Hgb urine dipstick: NEGATIVE
Ketones, ur: NEGATIVE mg/dL
Nitrite: NEGATIVE
Protein, ur: NEGATIVE mg/dL
Specific Gravity, Urine: 1.023 (ref 1.005–1.030)
pH: 5 (ref 5.0–8.0)

## 2021-07-20 LAB — TROPONIN I (HIGH SENSITIVITY)
Troponin I (High Sensitivity): 6 ng/L (ref <18)
Troponin I (High Sensitivity): 7 ng/L (ref <18)

## 2021-07-20 LAB — CBC
HCT: 40.1 % (ref 36.0–46.0)
Hemoglobin: 13.1 g/dL (ref 12.0–15.0)
MCH: 33.2 pg (ref 26.0–34.0)
MCHC: 32.7 g/dL (ref 30.0–36.0)
MCV: 101.5 fL — ABNORMAL HIGH (ref 80.0–100.0)
Platelets: 195 10*3/uL (ref 150–400)
RBC: 3.95 MIL/uL (ref 3.87–5.11)
RDW: 13.2 % (ref 11.5–15.5)
WBC: 6.6 10*3/uL (ref 4.0–10.5)
nRBC: 0 % (ref 0.0–0.2)

## 2021-07-20 LAB — BASIC METABOLIC PANEL
Anion gap: 7 (ref 5–15)
BUN: 19 mg/dL (ref 8–23)
CO2: 22 mmol/L (ref 22–32)
Calcium: 9.2 mg/dL (ref 8.9–10.3)
Chloride: 112 mmol/L — ABNORMAL HIGH (ref 98–111)
Creatinine, Ser: 1.21 mg/dL — ABNORMAL HIGH (ref 0.44–1.00)
GFR, Estimated: 45 mL/min — ABNORMAL LOW (ref >60)
Glucose, Bld: 147 mg/dL — ABNORMAL HIGH (ref 70–99)
Potassium: 3.8 mmol/L (ref 3.5–5.1)
Sodium: 141 mmol/L (ref 135–145)

## 2021-07-20 LAB — HEMOGLOBIN A1C
Hgb A1c MFr Bld: 6.4 % — ABNORMAL HIGH (ref 4.8–5.6)
Mean Plasma Glucose: 136.98 mg/dL

## 2021-07-20 LAB — CBG MONITORING, ED
Glucose-Capillary: 155 mg/dL — ABNORMAL HIGH (ref 70–99)
Glucose-Capillary: 121 mg/dL — ABNORMAL HIGH (ref 70–99)

## 2021-07-20 LAB — GLUCOSE, CAPILLARY
Glucose-Capillary: 105 mg/dL — ABNORMAL HIGH (ref 70–99)
Glucose-Capillary: 122 mg/dL — ABNORMAL HIGH (ref 70–99)

## 2021-07-20 MED ORDER — TRAMADOL HCL 50 MG PO TABS
50.0000 mg | ORAL_TABLET | Freq: Four times a day (QID) | ORAL | Status: DC | PRN
  Administered 2021-07-21 (×2): 50 mg via ORAL
  Filled 2021-07-20 (×2): qty 1

## 2021-07-20 MED ORDER — LIDOCAINE 5 % EX PTCH
1.0000 | MEDICATED_PATCH | CUTANEOUS | Status: DC
  Administered 2021-07-20 – 2021-07-21 (×2): 1 via TRANSDERMAL
  Filled 2021-07-20: qty 1

## 2021-07-20 MED ORDER — ACETAMINOPHEN 650 MG RE SUPP: 650.0000 mg | Freq: Three times a day (TID) | RECTAL | Status: DC

## 2021-07-20 MED ORDER — ACETAMINOPHEN 500 MG PO TABS
1000.0000 mg | ORAL_TABLET | Freq: Three times a day (TID) | ORAL | Status: DC
  Administered 2021-07-20 – 2021-07-21 (×5): 1000 mg via ORAL
  Filled 2021-07-20 (×6): qty 2

## 2021-07-20 MED ORDER — LACTATED RINGERS IV SOLN: INTRAVENOUS | Status: DC

## 2021-07-20 MED ORDER — OXYCODONE HCL 5 MG PO TABS
5.0000 mg | ORAL_TABLET | Freq: Four times a day (QID) | ORAL | Status: DC | PRN
  Administered 2021-07-21: 5 mg via ORAL
  Filled 2021-07-20 (×2): qty 1

## 2021-07-20 NOTE — Assessment & Plan Note (Addendum)
As evidenced by multiple compression fractures. Start on calcium vitamin D supplement. Primary care follow-up

## 2021-07-20 NOTE — ED Notes (Signed)
Pt repositioned to her side to assisted with eating, this RN assisted patient with breakfast tray set up. Denies any other needs at this time.

## 2021-07-20 NOTE — Progress Notes (Addendum)
Progress Note   Patient: Joyce Maynard ZOX:[id number] DOB: 04-21-1940 DOA: 07/19/2021     0 DOS: the patient was seen and examined on 07/20/2021   Brief hospital course: [name] is a 81 year old female with history of hypothyroid, anxiety, depression, non-insulin-dependent diabetes mellitus, B12 deficiency, hyperlipidemia, GERD, hypertension, who presented to the ED on 07/19/2021 for evaluation of back pain that had progressively worsened since patient had a fall at home about 2 weeks ago.  Pain had progressed to the point.  She was no longer able to ambulate.  Evaluation in the ED revealed multiple compression fractures of varying chronicity.  Subsequent MRI showed acute compression fractures at T12 and L3, remote fractures at T10, T11, L1, L2 and L4 with multilevel foraminal stenosis most severe at L3-4 and worse on the left.  Patient was admitted to the hospital for pain control and evaluation by neurosurgery.  Assessment and Plan: * Back pain Due to multiple compression fractures, acute at T12 and L3.  Presented with progressively worsening pain after fall 2 weeks ago. -- Neurosurgery consulted --TLSO brace when out of bed --Pain control: Scheduled Tylenol, lidocaine patch, as needed tramadol or oxycodone, IV Toradol preferred over morphine for breakthrough pain  --Narcan available if needed, was given around 9 PM on 7/15 due to reduced responsiveness, patient responded well --Bowel regimen --Limit sedating meds as much as possible   Elevated serum creatinine Creatinine on admission was 0.97, up 0.21 today.  Baseline appears around 1 so does not meet criteria for AKI at this point.  Suspect poor p.o. intake with reduced responsiveness from pain medications since admission. -- Gentle IV hydration -- Repeat BMP tomorrow  Osteoporosis As evidenced by multiple compression fractures. Start on calcium vitamin D supplement. Primary care follow-up  Vertebral compression fracture  (HCC) Management as outlined and back pain. Imaging on admission including MRI lumbar spine showed acute/subacute compression fractures at T12 and L3, remote compression fractures at levels of T10, T11, L1, L2, L4, multilevel foraminal stenosis most severe at the level of L3-4 and worse on the left.    History of CVA (cerebrovascular accident) Continue aspirin and statin  Weakness Leading to multiple falls at home.  Patient reports increased falls since her stroke resulting in additional compression fractures. -- PT OT evaluations -- Fall precautions  Anxiety Continued on home venlafaxine and citalopram Ativan as needed  CKD (chronic kidney disease), stage IIIa - Currently at baseline  Mild depression Resumed on home venlafaxine and citalopram  GERD (gastroesophageal reflux disease) Continue PPI  Hypothyroidism Continue levothyroxine  Hypercholesterolemia Continue statin  Type II diabetes mellitus with renal manifestations ([place]) Non-insulin-dependent.  Home regimen is metformin which is held. Sliding scale NovoLog Adjust insulin as needed for goal 140-180  Essential hypertension, benign Continue home metoprolol        Subjective: Patient seen in the ED holding for bed this morning.  Pain much better controlled today.  She reports recurrent falls since her stroke.  No other acute complaints at this time.  No acute events reported but I see patient was given narcan last evening after reduced responsiveness from pain medications, she responded well.  Discussed minimizing sedating strong pain meds as much as we can, balance pain control with sedating side effects. She expresses agreement and understanding.  Physical Exam: Vitals:   07/20/21 0730 07/20/21 0830 07/20/21 1000 07/20/21 1232  BP: 113/71 (!) 110/93 (!) 99/54 133/73  Pulse: 79 94 84 89  Resp: '14 20 18 '$ 14  Temp:   98.6 F (37 C) 97.7 F (36.5 C)  TempSrc:   Oral Oral  SpO2: 94% 99% 92% 99%  Weight:       Height:       General exam: awake, alert, no acute distress HEENT: atraumatic, clear conjunctiva, anicteric sclera, moist mucus membranes, hearing grossly normal  Respiratory system: CTAB, no wheezes, rales or rhonchi, normal respiratory effort. Cardiovascular system: normal S1/S2, RRR, no pedal edema.   Gastrointestinal system: soft, NT, ND, no HSM felt, +bowel sounds. Central nervous system: A&O x3. no gross focal neurologic deficits, normal speech Extremities: moves all, no edema, normal tone Skin: dry, intact, normal temperature Psychiatry: normal mood, congruent affect, judgement and insight appear normal   Data Reviewed:  Notable labs: Chloride 112, glucose 147, creatinine 1.21 up from 0.97, CBC normal other than macrocytosis 101.5  Family Communication: None at bedside during encounter, will attempt to call  Disposition: Status is: Observation The patient remains OBS appropriate and will d/c before 2 midnights. Disposition planning underway, awaiting PT OT recommendations and neurosurgery to see but do not anticipate procedures or surgical intervention   Planned Discharge Destination: Home with [facility] versus SNF    Time spent: 35 minutes  Author: Ezekiel Slocumb, DO 07/20/2021 2:51 PM  For on call review [email or web address].

## 2021-07-20 NOTE — Assessment & Plan Note (Signed)
Continue statin. 

## 2021-07-20 NOTE — Consult Note (Signed)
Referring Physician:  No referring provider defined for this encounter.  Primary Physician:  Joyce Pheasant, MD  Chief Complaint:  back pain  History of Present Illness: Joyce Maynard is a 81 y.o. female who presents with the chief complaint of back pain and some leg pain. 81 yo. With history of stroke ~ 2 years ago with residual right lower extremity weakness and pain who had a fall ~10 days ago at home.  Was doing well until about 3 days ago and started to have worsening pain.  Also some left leg pain.  No bowel or bladder issues   The symptoms are causing a significant impact on the patient's life.   Review of Systems:  A 10 point review of systems is negative, except for the pertinent positives and negatives detailed in the HPI.  Past Medical History: Past Medical History:  Diagnosis Date   Breast cancer ([place]) 2005   s/p chemotherapy and XRT   Depression    Diabetes ([place])    GERD (gastroesophageal reflux disease)    Hx of vertigo    Hypercholesterolemia    Hypertension    Hypothyroidism    [place] 1948   history   PONV (postoperative nausea and vomiting)     Past Surgical History: Past Surgical History:  Procedure Laterality Date   BREAST BIOPSY Left 2005   BREAST BIOPSY Left 10/25/2015   US guided biopsy   BREAST LUMPECTOMY Left 2005   EXCISION OF BREAST LESION Left 11/19/2015   Procedure: EXCISION OF BREAST LESION/ MASS;  Surgeon: Leonie Green, MD;  Location: [facility] ORS;  Service: General;  Laterality: Left;   EYE SURGERY     cataracts bilateral   TUBAL LIGATION      Problem List: Patient Active Problem List   Diagnosis Date Noted   Vertebral compression fracture (Joyce Maynard) 07/20/2021   Osteoporosis 07/20/2021   Elevated serum creatinine 07/20/2021   Back pain 07/19/2021   Right groin pain 12/14/2019   History of medication noncompliance 11/25/2019   History of CVA (cerebrovascular accident) 10/18/2019   Stroke ([place]) 10/17/2019   Anxiety 10/17/2019    Nausea vomiting and diarrhea 10/17/2019   Hypokalemia 10/17/2019   Weakness    CKD (chronic kidney disease), stage IIIa 12/12/2018   Left breast lump 09/10/2015   Health care maintenance 10/05/2014   Mild depression 04/16/2014   Other malaise and fatigue 09/20/2013   Leg pain 01/18/2013   Essential hypertension, benign 04/18/2012   Type II diabetes mellitus with renal manifestations (Joyce Maynard) 04/18/2012   Hypercholesterolemia 04/18/2012   History of breast cancer 04/18/2012   Hypothyroidism 04/18/2012   GERD (gastroesophageal reflux disease) 04/18/2012    Allergies: Allergies as of 07/19/2021   (No Known Allergies)    Medications: '@ENCMED'$ @  Social History: Social History   Tobacco Use   Smoking status: Never   Smokeless tobacco: Never  Substance Use Topics   Alcohol use: No    Alcohol/week: 0.0 standard drinks of alcohol   Drug use: No    Family Medical History: Family History  Problem Relation Age of Onset   Cancer Mother        breast   Breast cancer Mother 67   Stroke Father    Breast cancer Maternal Aunt     Physical Examination: '@VITALWITHPAIN'$ @  General: Patient is well developed, well nourished, calm, collected, and in no apparent distress.  Psychiatric: Patient is non-anxious.  Head:  Pupils equal, round, and reactive to light.  ENT:  Oral  mucosa appears well hydrated.  Neck:   Supple.  Full range of motion.  Respiratory: Patient is breathing without any difficulty.  Extremities: No edema.  Vascular: Palpable pulses.  Skin:   On exposed skin, there are no abnormal skin lesions.  NEUROLOGICAL:  General: In no acute distress.   Awake, alert, oriented to person, place, and time.  Pupils equal round and reactive to light.  Facial tone is symmetric.  Tongue protrusion is midline.  There is no pronator drift.    Strength: Side Biceps Triceps Deltoid Interossei Grip Wrist Ext. Wrist Flex.  R '5 5 5 5 5 5 5  '$ L '5 5 5 5 5 5 5   '$ Side Iliopsoas  Quads Hamstring PF DF EHL  R '5 5 5 5 5 5  '$ L '5 5 5 5 5 5   '$ Reflexes are 2+ and symmetric at the biceps, triceps, brachioradialis, patella and achilles.   Bilateral upper and lower extremity sensation is intact to light touch and pin prick.  Clonus is not present.  Toes are down-going.  Gait is not tested.  Hoffman's is absent.  Imaging: CT:  IMPRESSION: 1. Multiple age indeterminate superior endplate compression fractures at T11, T12, L2, L3 and L4. MRI could be obtained to further assess the acuity of these findings. 2. Moderate superior endplate compression fracture at L1 is chronic and not appreciably changed from 2017. 3. Lumbar spondylosis as above. 4. Aortic Atherosclerosis (ICD10-I70.0).   MRI: . Acute/subacute fractures at T12 and L3 as described. 2. Compression fractures at T10, T11, L1, L2, and L4 are remote. 3. Mild left foraminal narrowing at L1-2. 4. Mild foraminal narrowing bilaterally at L2-3. 5. Moderate to severe left and moderate right foraminal stenosis at L3-4. 6. Moderate foraminal stenosis bilaterally at L4-5 is worse on the right. 7. Moderate foraminal narrowing bilaterally at L5-S1 is worse on the left.    I have personally reviewed the images and agree with the above interpretation.  Assessment and Plan: Joyce Maynard is a pleasant 81 y.o. female with worsening back pain after fall and several compression fractures.  I have discussed the condition with the patient, including showing the radiographs and discussing treatment options in layman's terms.  The patient may benefit from conservative management.  Thus, I have recommended the following:   1.  No acute surgical interventions warranted.   2.  Recommend pain control. 3.  Recommend TLSO brace and upright films.   Thank you for involving me in the care of this patient. I will keep you apprised of the patient's progress.   This note was partially dictated using voice recognition software, so please  excuse any errors that were not corrected.   Redge Gainer MD, PhD

## 2021-07-20 NOTE — Evaluation (Signed)
Occupational Therapy Evaluation Patient Details Name: Joyce Maynard MRN: [id number] DOB: 1940/05/01 Today's Date: 07/20/2021   History of Present Illness Pt is a 81 year old female with PMH including HTN, HLD, DM, and depression and anxiety.  She presented to ED on 7/15 with back pain after fall 2 weeks ago.  MRI reveals new fractures at T12 and L3, as well as chronic fractures.  Neurosurgery recommending conservative management with use of TLSO brace.   Clinical Impression   Joyce Maynard presents to OT with pain and limited strength/balance that impacts her ability to safely and independently complete functional tasks.  Prior to admission, patient lived with her husband and was able to complete most ADLs independently using RW.  She and her husband (who has dementia) receive daily assistance from their 4 children for meals, transportation, cleaning, and medications.  Currently, patient requires significantly increased assistance for ADLs due to her back pain from recent fall.  She is able to complete ADLs at bedlevel with setup-min A, but was unable to demonstrate further mobility in today's session.  Pt requires min assist for rolling and bed mobility, but was unable to fully come to seated position due to her pain.  OT provided encouragement, interventions for managing anxiety, and education re: importance of OOB mobility to support strength and independence.  OT also provided education re: donning/doffing TLSO brace to patient's daughter, as well as spinal precautions to maximize safety/comfort.  Joyce Maynard will continue to benefit from skilled OT services in acute setting to support pain management, functional strengthening, safety and independence in ADLs.  Recommend STR at this time to further support these goals.      Recommendations for follow up therapy are one component of a multi-disciplinary discharge planning process, led by the attending physician.  Recommendations may be updated based on patient  status, additional functional criteria and insurance authorization.   Follow Up Recommendations  Skilled nursing-short term rehab (<3 hours/day)    Assistance Recommended at Discharge Frequent or constant Supervision/Assistance  Patient can return home with the following A lot of help with walking and/or transfers;A lot of help with bathing/dressing/bathroom;Assistance with cooking/housework;Direct supervision/assist for medications management;Direct supervision/assist for financial management;Assist for transportation;Help with stairs or ramp for entrance    Functional Status Assessment  Patient has had a recent decline in their functional status and demonstrates the ability to make significant improvements in function in a reasonable and predictable amount of time.  Equipment Recommendations  BSC/3in1    Recommendations for Other Services       Precautions / Restrictions Precautions Precautions: Fall Required Braces or Orthoses: Spinal Brace Spinal Brace: Thoracolumbosacral orthotic Restrictions Weight Bearing Restrictions: No Other Position/Activity Restrictions: spinal precautions      Mobility Bed Mobility Overal bed mobility: Needs Assistance Bed Mobility: Rolling, Sidelying to Sit, Sit to Supine Rolling: Min assist Sidelying to sit: Min assist   Sit to supine: Min assist   General bed mobility comments: HOB elevated, unable to fully achieve sitting due to pt's pain Patient Response: Anxious, Cooperative  Transfers Overall transfer level: Needs assistance                 General transfer comment: unable to assess      Balance Overall balance assessment: Needs assistance  ADL either performed or assessed with clinical judgement   ADL Overall ADL's : Needs assistance/impaired                                       General ADL Comments: Requires grossly setup assist for ADLs at  bedlevel, unable to assess further due to pain     Vision Patient Visual Report: No change from baseline       Perception     Praxis      Pertinent Vitals/Pain Pain Assessment Pain Assessment: 0-10 Pain Score: 6  Pain Location: denied pain at rest, rates pain as 6 with movement but unwilling to continue with mobility Pain Descriptors / Indicators: Guarding, Crying, Moaning Pain Intervention(s): Limited activity within patient's tolerance, Monitored during session, RN gave pain meds during session, Relaxation, Utilized relaxation techniques     Hand Dominance Right   Extremity/Trunk Assessment Upper Extremity Assessment Upper Extremity Assessment: Overall WFL for tasks assessed   Lower Extremity Assessment Lower Extremity Assessment: Defer to PT evaluation       Communication Communication Communication: No difficulties   Cognition Arousal/Alertness: Awake/alert Behavior During Therapy: WFL for tasks assessed/performed Overall Cognitive Status: Within Functional Limits for tasks assessed                                 General Comments: alert and oriented x4, limited by anxiety     General Comments       Exercises Other Exercises Other Exercises: provided education re: OT role and plan of care, fall and safety precautions, donning/doffing brace, spinal precautions, bed mobility, discharge recommendations   Shoulder Instructions      Home Living Family/patient expects to be discharged to:: Private residence Living Arrangements: Spouse/significant other (pt's husband has dementia) Available Help at Discharge: Family;Available 24 hours/day (pt's 4 kids provide daily care, report they are willing to provide support as needed) Type of Home: House Home Access: Stairs to enter [employer] of Steps: single threshold step Entrance Stairs-Rails: None Home Layout: One level     Bathroom Shower/Tub: Teacher, early years/pre:  Standard     Home Equipment: Rollator (4 wheels);Rolling Walker (2 wheels);Grab bars - tub/shower;Grab bars - toilet;Shower seat          Prior Functioning/Environment Prior Level of Function : Needs assist       Physical Assist : ADLs (physical)   ADLs (physical): IADLs;Bathing Mobility Comments: mod I with RW ADLs Comments: receives intermittent assist for bathing, primarily takes sponge baths.  Pt's children provide support for IADLs including cooking, cleaning, meds, driving        OT Problem List: Decreased strength;Decreased range of motion;Decreased activity tolerance;Impaired balance (sitting and/or standing);Decreased knowledge of use of DME or AE;Decreased knowledge of precautions;Pain      OT Treatment/Interventions: Self-care/ADL training;Therapeutic exercise;Neuromuscular education;Energy conservation;DME and/or AE instruction;Therapeutic activities;Patient/family education;Balance training    OT Goals(Current goals can be found in the care plan section) Acute Rehab OT Goals Patient Stated Goal: to return home, manage pain OT Goal Formulation: With patient/family Time For Goal Achievement: 08/03/21 Potential to Achieve Goals: Fair  OT Frequency: Min 2X/week    Co-evaluation              AM-PAC OT "6 Clicks" Daily Activity     Outcome Measure Help from another person eating  meals?: A Little Help from another person taking care of personal grooming?: A Little Help from another person toileting, which includes using toliet, bedpan, or urinal?: A Lot Help from another person bathing (including washing, rinsing, drying)?: A Lot Help from another person to put on and taking off regular upper body clothing?: A Lot Help from another person to put on and taking off regular lower body clothing?: A Lot 6 Click Score: 14   End of Session Nurse Communication: Patient requests pain meds  Activity Tolerance: Patient limited by pain Patient left: in bed;with call  bell/phone within reach;with nursing/sitter in room;with family/visitor present  OT Visit Diagnosis: Muscle weakness (generalized) (M62.81);Pain;Repeated falls (R29.6) Pain - part of body:  (back)                Time: 9407-6808 OT Time Calculation (min): 59 min Charges:  OT General Charges $OT Visit: 1 Visit OT Evaluation $OT Eval Moderate Complexity: 1 Mod OT Treatments $Self Care/Home Management : 8-22 mins $Therapeutic Activity: 8-22 mins  Dartanyon Frankowski, OTR/L 07/20/21, 1:32 PM

## 2021-07-20 NOTE — Assessment & Plan Note (Signed)
Management as outlined and back pain. Imaging on admission including MRI lumbar spine showed acute/subacute compression fractures at T12 and L3, remote compression fractures at levels of T10, T11, L1, L2, L4, multilevel foraminal stenosis most severe at the level of L3-4 and worse on the left.

## 2021-07-20 NOTE — Assessment & Plan Note (Addendum)
Creatinine on admission was 0.97, baseline appears around 1.  Cr trended up slightly to >0.3 rise from baseline.  Treated with IV fluids.   AKI likely due to soft BP from pain meds, briefly hypotensive.  -- Encourage PO hydration -- Stop fluids -- Repeat BMP in 1 week

## 2021-07-20 NOTE — ED Notes (Signed)
Pt provided a [place] sandwich at this time

## 2021-07-20 NOTE — Progress Notes (Signed)
Physical Therapy Evaluation Patient Details Name: Joyce Maynard MRN: [id number] DOB: [birth year] Today's Date: 07/20/2021  History of Present Illness  Pt is a 81 year old female with PMH including HTN, HLD, DM, and depression and anxiety.  She presented to ED on 7/15 with back pain after fall 2 weeks ago.  MRI reveals new fractures at T12 and L3, as well as chronic fractures.  Neurosurgery recommending conservative management with use of TLSO brace.   Clinical Impression  Pt is a pleasant 81 year old female who was admitted for acute T12 and L3 compression fractures. Pt educated on back precautions prior to mobility. Pt reported 0/10 pain in back while at rest. Pt performs rolling on flat bed with CGA and verbal instruction for proper technique. Initially pt utilized bed rail, but after instruction on technique pt able to roll x2 trials without use of bed rail. Pt performed sidelying to sitting EOB with CGA and verbal instruction on technique and sit to sidelying with mod A for bilat LE mgmt to maintain precautions. TLSO brace donned in sitting with pts daughter able to teach proper donning technique. Pt performed initial sit-to-stand with Min A, followed by another attempt with CGA and RW. Pt ambulated 80 feet with RW and CGA with a couple of standing rest breaks due to L LE pain. Pt then stood to void utilizing purewick and brief. Pt max A to change brief in standing while pt completed static standing utilizing RW without LOB. Pt demonstrates deficits with strength, balance, endurance, and pain. Would benefit from skilled PT to address above deficits and promote optimal return to PLOF. Recommend HH PT due to pt current mobility status and family support at home. This entire session was guided, instructed, and directly supervised by Greggory Stallion, PT, DPT, GCS.     Recommendations for follow up therapy are one component of a multi-disciplinary discharge planning process, led by the attending physician.   Recommendations may be updated based on patient status, additional functional criteria and insurance authorization.  Follow Up Recommendations Home health PT      Assistance Recommended at Discharge Intermittent Supervision/Assistance  Patient can return home with the following  A little help with walking and/or transfers;A little help with bathing/dressing/bathroom;Assistance with cooking/housework;Assist for transportation;Help with stairs or ramp for entrance    Equipment Recommendations None recommended by PT  Recommendations for Other Services       Functional Status Assessment Patient has had a recent decline in their functional status and demonstrates the ability to make significant improvements in function in a reasonable and predictable amount of time.     Precautions / Restrictions Precautions Precautions: Fall Required Braces or Orthoses: Spinal Brace Spinal Brace: Thoracolumbosacral orthotic Restrictions Weight Bearing Restrictions: No Other Position/Activity Restrictions: spinal precautions      Mobility  Bed Mobility Overal bed mobility: Needs Assistance Bed Mobility: Rolling, Sidelying to Sit, Sit to Sidelying Rolling: Min guard Sidelying to sit: Min guard     Sit to sidelying: Mod assist General bed mobility comments: CGA with verbal cueing for sequencing and technique to roll and sit EOB. Required mod A for bilat LE mgmt from sit to sidelying.    Transfers Overall transfer level: Needs assistance Equipment used: Rolling walker (2 wheels) Transfers: Sit to/from Stand Sit to Stand: Min assist, Min guard           General transfer comment: pt performed sit to stand initially with Min A, 2nd attempt pt was CGA  Ambulation/Gait Ambulation/Gait assistance: Min guard Gait Distance (Feet): 80 Feet Assistive device: Rolling walker (2 wheels) Gait Pattern/deviations: Step-through pattern, Antalgic Gait velocity: decreased     General Gait Details:  step-through, continuous gait pattern. slightly antalgic due to L LE pain. pt demonstrated safe use of RW. occasional standing rest break due to pain.  Stairs            Wheelchair Mobility    Modified Rankin (Stroke Patients Only)       Balance Overall balance assessment: Needs assistance Sitting-balance support: Feet supported, No upper extremity supported Sitting balance-Leahy Scale: Good Sitting balance - Comments: steady sitting EOB   Standing balance support: Reliant on assistive device for balance, During functional activity, Bilateral upper extremity supported Standing balance-Leahy Scale: Fair Standing balance comment: safe use of RW during ambulation. No LOB                             Pertinent Vitals/Pain Pain Assessment Pain Assessment: 0-10 Pain Score: 0-No pain (at start of session while lying flat in bed) Pain Location: back; L LE Pain Descriptors / Indicators: Discomfort, Aching, Grimacing Pain Intervention(s): Limited activity within patient's tolerance, Monitored during session, Premedicated before session, Repositioned    Home Living Family/patient expects to be discharged to:: Private residence Living Arrangements: Spouse/significant other (pt's husband has dementia) Available Help at Discharge: Family;Available 24 hours/day (pt's 4 kids provide daily care, report they are willing to provide support as needed) Type of Home: House Home Access: Stairs to enter Entrance Stairs-Rails: None Entrance Stairs-Number of Steps: single threshold step   Home Layout: One level Home Equipment: Rollator (4 wheels);Rolling Walker (2 wheels);Grab bars - tub/shower;Grab bars - toilet;Shower seat      Prior Function Prior Level of Function : Needs assist       Physical Assist : ADLs (physical)   ADLs (physical): IADLs;Bathing Mobility Comments: mod I with RW ADLs Comments: receives intermittent assist for bathing, primarily takes sponge baths.   Pt's children provide support for IADLs including cooking, cleaning, meds, driving     Hand Dominance   Dominant Hand: Right    Extremity/Trunk Assessment   Upper Extremity Assessment Upper Extremity Assessment: Overall WFL for tasks assessed    Lower Extremity Assessment Lower Extremity Assessment: Overall WFL for tasks assessed    Cervical / Trunk Assessment Cervical / Trunk Assessment: Normal  Communication   Communication: No difficulties  Cognition Arousal/Alertness: Awake/alert Behavior During Therapy: WFL for tasks assessed/performed Overall Cognitive Status: Within Functional Limits for tasks assessed                                 General Comments: A&Ox4        General Comments      Exercises Other Exercises Other Exercises: provided pt education on precautions and donning/doffing TLSO brace Other Exercises: pt voided in standing utilizing purewick. pt then required max A in standing to change brief while pt static standing with RW   Assessment/Plan    PT Assessment Patient needs continued PT services  PT Problem List Decreased strength;Decreased range of motion;Decreased balance;Decreased activity tolerance;Decreased mobility;Decreased knowledge of use of DME;Decreased knowledge of precautions;Pain       PT Treatment Interventions DME instruction;Stair training;Gait training;Functional mobility training;Therapeutic activities;Therapeutic exercise;Balance training;Patient/family education    PT Goals (Current goals can be found in the Care Plan section)  Acute Rehab  PT Goals Patient Stated Goal: to go home PT Goal Formulation: With patient Time For Goal Achievement: 08/03/21 Potential to Achieve Goals: Good    Frequency 7X/week     Co-evaluation               AM-PAC PT "6 Clicks" Mobility  Outcome Measure Help needed turning from your back to your side while in a flat bed without using bedrails?: A Little Help needed moving  from lying on your back to sitting on the side of a flat bed without using bedrails?: A Little Help needed moving to and from a bed to a chair (including a wheelchair)?: A Little Help needed standing up from a chair using your arms (e.g., wheelchair or bedside chair)?: A Little Help needed to walk in hospital room?: A Little Help needed climbing 3-5 steps with a railing? : A Little 6 Click Score: 18    End of Session Equipment Utilized During Treatment: Gait belt Activity Tolerance: Patient tolerated treatment well Patient left: in bed;with call bell/phone within reach;with bed alarm set;with family/visitor present Nurse Communication: Mobility status;Other (comment) (request for [facility]) PT Visit Diagnosis: Unsteadiness on feet (R26.81);Muscle weakness (generalized) (M62.81);Pain Pain - Right/Left: Left Pain - part of body: Leg    Time: 1610-9604 PT Time Calculation (min) (ACUTE ONLY): 42 min   Charges:             Rella Larve, SPT  Daemien Fronczak 07/20/2021, 3:39 PM

## 2021-07-21 DIAGNOSIS — M545 Low back pain, unspecified: Secondary | ICD-10-CM | POA: Diagnosis not present

## 2021-07-21 DIAGNOSIS — M8000XD Age-related osteoporosis with current pathological fracture, unspecified site, subsequent encounter for fracture with routine healing: Secondary | ICD-10-CM | POA: Diagnosis not present

## 2021-07-21 LAB — GLUCOSE, CAPILLARY
Glucose-Capillary: 164 mg/dL — ABNORMAL HIGH (ref 70–99)
Glucose-Capillary: 105 mg/dL — ABNORMAL HIGH (ref 70–99)

## 2021-07-21 LAB — BASIC METABOLIC PANEL
Anion gap: 6 (ref 5–15)
BUN: 30 mg/dL — ABNORMAL HIGH (ref 8–23)
CO2: 24 mmol/L (ref 22–32)
Calcium: 9.1 mg/dL (ref 8.9–10.3)
Chloride: 109 mmol/L (ref 98–111)
Creatinine, Ser: 1.36 mg/dL — ABNORMAL HIGH (ref 0.44–1.00)
GFR, Estimated: 39 mL/min — ABNORMAL LOW (ref >60)
Glucose, Bld: 141 mg/dL — ABNORMAL HIGH (ref 70–99)
Potassium: 4.5 mmol/L (ref 3.5–5.1)
Sodium: 139 mmol/L (ref 135–145)

## 2021-07-21 MED ORDER — LIDOCAINE 4 % EX CREA: TOPICAL_CREAM | Freq: Three times a day (TID) | CUTANEOUS | Status: DC | PRN

## 2021-07-21 MED ORDER — DOCUSATE SODIUM 100 MG PO CAPS: 100.0000 mg | ORAL_CAPSULE | Freq: Two times a day (BID) | ORAL | 0 refills | Status: AC

## 2021-07-21 MED ORDER — TRAMADOL HCL 50 MG PO TABS: 50.0000 mg | ORAL_TABLET | Freq: Four times a day (QID) | ORAL | 0 refills | Status: DC | PRN

## 2021-07-21 MED ORDER — ACETAMINOPHEN 500 MG PO TABS: 1000.0000 mg | ORAL_TABLET | Freq: Three times a day (TID) | ORAL | 0 refills | Status: DC

## 2021-07-21 MED ORDER — LIDOCAINE 4 % EX CREA: TOPICAL_CREAM | Freq: Three times a day (TID) | CUTANEOUS | 0 refills | Status: DC | PRN

## 2021-07-21 NOTE — Plan of Care (Signed)

## 2021-07-21 NOTE — Discharge Summary (Signed)
Physician Discharge Summary   Patient: [facility] MRN: [id number] DOB: [birth year]  Admit date:     07/19/2021  Discharge date: 07/21/2021  Discharge Physician: Ezekiel Slocumb   PCP: Einar Pheasant, MD   Recommendations at discharge:    Follow up with Primary Care in 1 week Repeat BMP, CBC, Mg in 1 week Follow up on pain control with multiple compression fractures Follow up with home health for PT and OT for rehab  Discharge Diagnoses: Principal Problem:   Back pain Active Problems:   Essential hypertension, benign   Type II diabetes mellitus with renal manifestations (HCC)   Hypercholesterolemia   Hypothyroidism   GERD (gastroesophageal reflux disease)   Mild depression   CKD (chronic kidney disease), stage IIIa   Anxiety   Weakness   History of CVA (cerebrovascular accident)   Vertebral compression fracture (HCC)   Osteoporosis   AKI (acute kidney injury) [facility])   [facility]: Ms. [name] is a 81 year old female with history of hypothyroid, anxiety, depression, non-insulin-dependent diabetes mellitus, B12 deficiency, hyperlipidemia, GERD, hypertension, who presented to the ED on 07/19/2021 for evaluation of back pain that had progressively worsened since patient had a fall at home about 2 weeks ago.  Pain had progressed to the point.  She was no longer able to ambulate.  Evaluation in the ED revealed multiple compression fractures of varying chronicity.  Subsequent MRI showed acute compression fractures at T12 and L3, remote fractures at T10, T11, L1, L2 and L4 with multilevel foraminal stenosis most severe at L3-4 and worse on the left.  Patient was admitted to the [facility] for pain control and evaluation by neurosurgery.   7/17: pt declines SNF, requests [facility] for rehab. Pain controlled on oral medications.  Pt is medically stable for discharge.  Discussed with patient and daughter.  Daughter was reluctant for d/c home due to lack of 24/7 caregiver support, but did  not appeal the discharge.     Assessment and Plan: * Back pain Due to multiple compression fractures, acute at T12 and L3.  Presented with progressively worsening pain after fall 2 weeks ago. -- Neurosurgery consulted --TLSO brace when out of bed --Pain control: Scheduled Tylenol, lidocaine patch, as needed tramadol or oxycodone, IV Toradol preferred over morphine for breakthrough pain  --Narcan available if needed, was given around 9 PM on 7/15 due to reduced responsiveness, patient responded well --Bowel regimen --Limit sedating meds as much as possible   AKI (acute kidney injury) ([place]) Creatinine on admission was 0.97, baseline appears around 1.  Cr trended up slightly to >0.3 rise from baseline.  Treated with IV fluids.   AKI likely due to soft BP from pain meds, briefly hypotensive.  -- Encourage PO hydration -- Stop fluids -- Repeat BMP in 1 week  Osteoporosis As evidenced by multiple compression fractures. Start on calcium vitamin D supplement. Primary care follow-up  Vertebral compression fracture (HCC) Management as outlined and back pain. Imaging on admission including MRI lumbar spine showed acute/subacute compression fractures at T12 and L3, remote compression fractures at levels of T10, T11, L1, L2, L4, multilevel foraminal stenosis most severe at the level of L3-4 and worse on the left.    History of CVA (cerebrovascular accident) Continue aspirin and statin  Weakness Leading to multiple falls at home.  Patient reports increased falls since her stroke resulting in additional compression fractures. -- PT OT evaluations -- Fall precautions  Anxiety Continued on home venlafaxine and citalopram Ativan as needed  CKD (chronic kidney disease), stage IIIa - Currently at baseline  Mild depression Resumed on home venlafaxine and citalopram  GERD (gastroesophageal reflux disease) Continue PPI  Hypothyroidism Continue  levothyroxine  Hypercholesterolemia Continue statin  Type II diabetes mellitus with renal manifestations ([place]) Non-insulin-dependent.  Home regimen is metformin which is held. Sliding scale NovoLog Adjust insulin as needed for goal 140-180  Essential hypertension, benign Continue home metoprolol         Consultants: Neurosurgery Procedures performed: None  Disposition: Home health Diet recommendation:  Discharge Diet Orders (From admission, onward)     Start     Ordered   07/21/21 0000  Diet - low sodium heart healthy        07/21/21 1244           Cardiac diet DISCHARGE MEDICATION: Allergies as of 07/21/2021   No Known Allergies      Medication List     TAKE these medications    acetaminophen 500 MG tablet Commonly known as: TYLENOL Take 2 tablets (1,000 mg total) by mouth every 8 (eight) hours. What changed:  when to take this reasons to take this   aspirin EC 81 MG tablet Take 81 mg by mouth every evening.   citalopram 10 MG tablet Commonly known as: CELEXA Take 1 tablet by mouth once daily What changed: when to take this   docusate sodium 100 MG capsule Commonly known as: COLACE Take 1 capsule (100 mg total) by mouth 2 (two) times daily.   FreeStyle Libre 3 Sensor Misc Place 1 sensor on the skin every 14 days. Use to check glucose continuously   lidocaine 4 % cream Commonly known as: LMX Apply topically 3 (three) times daily as needed (back pain).   metFORMIN 500 MG 24 hr tablet Commonly known as: GLUCOPHAGE-XR Take 500 mg by mouth daily with supper.   metoprolol succinate 25 MG 24 hr tablet Commonly known as: TOPROL-XL Take 1 tablet by mouth once daily What changed: when to take this   pantoprazole 40 MG tablet Commonly known as: PROTONIX TAKE 1 TABLET BY MOUTH TWICE DAILY WITH A MEAL What changed: when to take this   traMADol 50 MG tablet Commonly known as: ULTRAM Take 1 tablet (50 mg total) by mouth every 6 (six) hours as  needed for moderate pain or severe pain (despite tylenol).        Discharge Exam: Filed Weights   07/19/21 1213  Weight: 76.7 kg   General exam: awake, alert, no acute distress HEENT: atraumatic, clear conjunctiva, anicteric sclera, moist mucus membranes, hearing grossly normal  Respiratory system: CTAB, no wheezes, rales or rhonchi, normal respiratory effort. Cardiovascular system: normal S1/S2, RRR, no JVD, murmurs, rubs, gallops,  no pedal edema.   Gastrointestinal system: soft, NT, ND, no HSM felt, +bowel sounds. Central nervous system: A&O x4. no gross focal neurologic deficits, normal speech Extremities: moves all, no edema, normal tone Skin: dry, intact, normal temperature, normal color, No rashes, lesions or ulcers Psychiatry: normal mood, congruent affect, judgement and insight appear normal   Condition at discharge: stable  The results of significant diagnostics from this hospitalization (including imaging, microbiology, ancillary and laboratory) are listed below for reference.   Imaging Studies: MR LUMBAR SPINE WO CONTRAST  Result Date: 07/19/2021 CLINICAL DATA:  Compression fractures. EXAM: MRI LUMBAR SPINE WITHOUT CONTRAST TECHNIQUE: Multiplanar, multisequence MR imaging of the lumbar spine was performed. No intravenous contrast was administered. COMPARISON:  MRI of the lumbar scratched at CT lumbar spine 07/19/2021  FINDINGS: Segmentation: 5 non rib-bearing lumbar type vertebral bodies are present. The lowest fully formed vertebral body is L5. Alignment: Slight anterolisthesis is present at T12-L1. No other significant listhesis is present. Lumbar lordosis is preserved. Vertebrae: Compression fractures at T10, T11, L1, L2 and L4 are remote. Diffuse edema is present at T12 and L3 compatible with acute fractures. Minimal loss of height is present at L3. Over 40% loss of height is present at T12. Mild edema and enhancement along the inferior aspect of L5 is reactive to disc  disease. Conus medullaris and cauda equina: Conus extends to the L1 level. Conus and cauda equina appear normal. Paraspinal and other soft tissues: Limited imaging the abdomen is unremarkable. There is no significant adenopathy. No solid organ lesions are present. Moderate paraspinous muscle atrophy noted. Disc levels: T11-12: Mild disc bulging and facet hypertrophy is present without significant stenosis. T12-L1: Uncovering of broad-based disc bulge is present. Mild facet hypertrophy is present. No significant stenosis is present. L1-2: A leftward disc protrusion is present. Mild left foraminal narrowing is present. L2-3: A leftward disc protrusion is present. Mild facet hypertrophy noted. Mild foraminal narrowing is present bilaterally. Central canal is patent. L3-4: Broad-based disc protrusion is present. Moderate facet hypertrophy is worse on the left. Central canal is patent. Moderate to severe left and moderate right foraminal stenosis is present. L4-5: A broad-based disc protrusion is present. Mild facet hypertrophy noted. Central canal is patent. Moderate foraminal stenosis is worse on the right. L5-S1: Advanced facet hypertrophy and spurring is worse on the left. A broad-based disc protrusion is present. Moderate foraminal narrowing is worse on the left. IMPRESSION: 1. Acute/subacute fractures at T12 and L3 as described. 2. Compression fractures at T10, T11, L1, L2, and L4 are remote. 3. Mild left foraminal narrowing at L1-2. 4. Mild foraminal narrowing bilaterally at L2-3. 5. Moderate to severe left and moderate right foraminal stenosis at L3-4. 6. Moderate foraminal stenosis bilaterally at L4-5 is worse on the right. 7. Moderate foraminal narrowing bilaterally at L5-S1 is worse on the left. Electronically Signed   By: San Morelle M.D.   On: 07/19/2021 19:38   CT Lumbar Spine Wo Contrast  Result Date: 07/19/2021 CLINICAL DATA:  Back pain.  Recent fall EXAM: CT LUMBAR SPINE WITHOUT CONTRAST  TECHNIQUE: Multidetector CT imaging of the lumbar spine was performed without intravenous contrast administration. Multiplanar CT image reconstructions were also generated. RADIATION DOSE REDUCTION: This exam was performed according to the departmental dose-optimization program which includes automated exposure control, adjustment of the mA and/or kV according to patient size and/or use of iterative reconstruction technique. COMPARISON:  CT 05/31/2015 FINDINGS: Segmentation: 5 lumbar type vertebrae. Alignment: Grade 1 anterolisthesis of L4 on L5 and L5 on S1 mediated by degenerative facet arthropathy. No traumatic listhesis. Vertebrae: Multiple age indeterminate superior endplate compression fractures at T11, T12, and L2. There are mild superior endplate compression deformities of L3 and L4, also age indeterminate. Moderate superior endplate compression fracture at L1 is chronic and not appreciably changed from 2017 where there is minimal bony retropulsion at the superior endplate. Bones appear demineralized. Multiple scattered Schmorl's nodes. Paraspinal and other soft tissues: Aortoiliac atherosclerosis. No acute findings. Disc levels: Mild multilevel intervertebral disc height loss is most pronounced at the L2-3 level. Moderate-advanced lower lumbar facet arthropathy, left worse than right. No evidence of high-grade canal stenosis by CT. Multiple levels of bilateral foraminal narrowing, which appears most severe at the L3-4 and L5-S1 levels bilaterally, left worse than right. IMPRESSION:  1. Multiple age indeterminate superior endplate compression fractures at T11, T12, L2, L3 and L4. MRI could be obtained to further assess the acuity of these findings. 2. Moderate superior endplate compression fracture at L1 is chronic and not appreciably changed from 2017. 3. Lumbar spondylosis as above. 4. Aortic Atherosclerosis (ICD10-I70.0). Electronically Signed   By: Davina Poke D.O.   On: 07/19/2021 15:32   CT  PELVIS WO CONTRAST  Result Date: 07/19/2021 CLINICAL DATA:  Back pain following a recent fall. EXAM: CT PELVIS WITHOUT CONTRAST TECHNIQUE: Multidetector CT imaging of the pelvis was performed following the standard protocol without intravenous contrast. RADIATION DOSE REDUCTION: This exam was performed according to the departmental dose-optimization program which includes automated exposure control, adjustment of the mA and/or kV according to patient size and/or use of iterative reconstruction technique. COMPARISON:  Abdomen and pelvis CT dated 05/31/2015. FINDINGS: Urinary Tract:  Unremarkable urinary bladder and distal ureters. Bowel: Normal appearing appendix and included loops of small bowel and colon. Vascular/Lymphatic: Atheromatous arterial calcifications without aneurysm. No enlarged lymph nodes. Reproductive:  No mass or other significant abnormality Other:  Small umbilical hernia containing fat. Musculoskeletal: Marked right hip degenerative changes. Lower lumbar spine degenerative changes. Additional lumbar spine findings will be described separately in the lumbar spine CT report. No pelvic fracture or dislocation seen. IMPRESSION: 1. No acute pelvic abnormality. 2. Marked right hip degenerative changes. Electronically Signed   By: Claudie Revering M.D.   On: 07/19/2021 15:29    Microbiology: Results for orders placed or performed during the [facility] encounter of 10/17/19  Respiratory Panel by RT PCR (Flu A&B, Covid) - Nasopharyngeal Swab     Status: None   Collection Time: 10/17/19  1:59 PM   Specimen: Nasopharyngeal Swab  Result Value Ref Range Status   SARS Coronavirus 2 by RT PCR NEGATIVE NEGATIVE Final    Comment: (NOTE) SARS-CoV-2 target nucleic acids are NOT DETECTED.  The SARS-CoV-2 RNA is generally detectable in upper respiratoy specimens during the acute phase of infection. The lowest concentration of SARS-CoV-2 viral copies this assay can detect is 131 copies/mL. A negative result  does not preclude SARS-Cov-2 infection and should not be used as the sole basis for treatment or other patient management decisions. A negative result may occur with  improper specimen collection/handling, submission of specimen other than nasopharyngeal swab, presence of viral mutation(s) within the areas targeted by this assay, and inadequate number of viral copies (<131 copies/mL). A negative result must be combined with clinical observations, patient history, and epidemiological information. The expected result is Negative.  Fact Sheet for Patients:  PinkCheek.be  Fact Sheet for Healthcare Providers:  GravelBags.it  This test is no t yet approved or cleared by the [place] FDA and  has been authorized for detection and/or diagnosis of SARS-CoV-2 by FDA under an Emergency Use Authorization (EUA). This EUA will remain  in effect (meaning this test can be used) for the duration of the COVID-19 declaration under Section 564(b)(1) of the Act, 21 U.S.C. section 360bbb-3(b)(1), unless the authorization is terminated or revoked sooner.     Influenza A by PCR NEGATIVE NEGATIVE Final   Influenza B by PCR NEGATIVE NEGATIVE Final    Comment: (NOTE) The Xpert Xpress SARS-CoV-2/FLU/RSV assay is intended as an aid in  the diagnosis of influenza from Nasopharyngeal swab specimens and  should not be used as a sole basis for treatment. Nasal washings and  aspirates are unacceptable for Xpert Xpress SARS-CoV-2/FLU/RSV  testing.  Fact Sheet  for Patients: PinkCheek.be  Fact Sheet for Healthcare Providers: GravelBags.it  This test is not yet approved or cleared by the [place] FDA and  has been authorized for detection and/or diagnosis of SARS-CoV-2 by  FDA under an Emergency Use Authorization (EUA). This EUA will remain  in effect (meaning this test can be used) for  the duration of the  Covid-19 declaration under Section 564(b)(1) of the Act, 21  U.S.C. section 360bbb-3(b)(1), unless the authorization is  terminated or revoked. Performed at [facility], [address] [place], [place] [address]   Respiratory Panel by RT PCR (Flu A&B, Covid) - Nasopharyngeal Swab     Status: None   Collection Time: 10/23/19 12:39 PM   Specimen: Nasopharyngeal Swab  Result Value Ref Range Status   SARS Coronavirus 2 by RT PCR NEGATIVE NEGATIVE Final    Comment: (NOTE) SARS-CoV-2 target nucleic acids are NOT DETECTED.  The SARS-CoV-2 RNA is generally detectable in upper respiratoy specimens during the acute phase of infection. The lowest concentration of SARS-CoV-2 viral copies this assay can detect is 131 copies/mL. A negative result does not preclude SARS-Cov-2 infection and should not be used as the sole basis for treatment or other patient management decisions. A negative result may occur with  improper specimen collection/handling, submission of specimen other than nasopharyngeal swab, presence of viral mutation(s) within the areas targeted by this assay, and inadequate number of viral copies (<131 copies/mL). A negative result must be combined with clinical observations, patient history, and epidemiological information. The expected result is Negative.  Fact Sheet for Patients:  PinkCheek.be  Fact Sheet for Healthcare Providers:  GravelBags.it  This test is no t yet approved or cleared by the [place] FDA and  has been authorized for detection and/or diagnosis of SARS-CoV-2 by FDA under an Emergency Use Authorization (EUA). This EUA will remain  in effect (meaning this test can be used) for the duration of the COVID-19 declaration under Section 564(b)(1) of the Act, 21 U.S.C. section 360bbb-3(b)(1), unless the authorization is terminated or revoked sooner.     Influenza A by  PCR NEGATIVE NEGATIVE Final   Influenza B by PCR NEGATIVE NEGATIVE Final    Comment: (NOTE) The Xpert Xpress SARS-CoV-2/FLU/RSV assay is intended as an aid in  the diagnosis of influenza from Nasopharyngeal swab specimens and  should not be used as a sole basis for treatment. Nasal washings and  aspirates are unacceptable for Xpert Xpress SARS-CoV-2/FLU/RSV  testing.  Fact Sheet for Patients: PinkCheek.be  Fact Sheet for Healthcare Providers: GravelBags.it  This test is not yet approved or cleared by the [place] FDA and  has been authorized for detection and/or diagnosis of SARS-CoV-2 by  FDA under an Emergency Use Authorization (EUA). This EUA will remain  in effect (meaning this test can be used) for the duration of the  Covid-19 declaration under Section 564(b)(1) of the Act, 21  U.S.C. section 360bbb-3(b)(1), unless the authorization is  terminated or revoked. Performed at [facility], [address] [place],  [address]     Labs: CBC: No results for input(s): "WBC", "NEUTROABS", "HGB", "HCT", "MCV", "PLT" in the last 168 hours.  Basic Metabolic Panel: No results for input(s): "NA", "K", "CL", "CO2", "GLUCOSE", "BUN", "CREATININE", "CALCIUM", "MG", "PHOS" in the last 168 hours.  Liver Function Tests: No results for input(s): "AST", "ALT", "ALKPHOS", "BILITOT", "PROT", "ALBUMIN" in the last 168 hours.  CBG: No results for input(s): "GLUCAP" in the last 168 hours.  Discharge time spent: greater than 30 minutes.  Signed: Ezekiel Slocumb, DO [facility] 07/29/2021

## 2021-07-21 NOTE — Progress Notes (Signed)
Physical Therapy Treatment Patient Details Name: Joyce Maynard MRN: [id number] DOB: 1940/04/09 Today's Date: 07/21/2021   History of Present Illness Pt is a 81 year old female with PMH including HTN, HLD, DM, and depression and anxiety.  She presented to ED on 7/15 with back pain after fall 2 weeks ago.  MRI reveals new fractures at T12 and L3, as well as chronic fractures.  Neurosurgery recommending conservative management with use of TLSO brace.   PT Comments    Pt agreeable to PT this date. Pt requested pain meds due to consistent pain throughout session at 8/10 in back and L LE (nursing notified). Pt education provided on back precautions due to pt being unable to verbalize precaution at start of session. Pt was able to verbalize sequencing for bed mobility prior to mobilizing. Pt required supervision to sit EOB to maintain back precautions, and Mod A for donning TLSO in sitting with pt assisting as able. Pt min A to stand. Pt ambulated 200 feet with RW and CGA with occasional standing rest breaks due to L LE pain. Pt left in recliner with needs met and TLSO doffed (with nursing approval). Continue to recommend [facility] PT at discharge due to current mobility status and family support at home.    Recommendations for follow up therapy are one component of a multi-disciplinary discharge planning process, led by the attending physician.  Recommendations may be updated based on patient status, additional functional criteria and insurance authorization.  Follow Up Recommendations  Home health PT     Assistance Recommended at Discharge Intermittent Supervision/Assistance  Patient can return home with the following A little help with walking and/or transfers;A little help with bathing/dressing/bathroom;Assistance with cooking/housework;Assist for transportation;Help with stairs or ramp for entrance   Equipment Recommendations  None recommended by PT    Recommendations for Other Services        Precautions / Restrictions Precautions Precautions: Fall;Back Required Braces or Orthoses: Spinal Brace Spinal Brace: Thoracolumbosacral orthotic Restrictions Weight Bearing Restrictions: No Other Position/Activity Restrictions: spinal precautions     Mobility  Bed Mobility Overal bed mobility: Needs Assistance Bed Mobility: Rolling, Sidelying to Sit Rolling: Supervision Sidelying to sit: Supervision       General bed mobility comments: Pt completed bed mobility to sit EOB with Supervision for maintaining back precautions.    Transfers Overall transfer level: Needs assistance Equipment used: Rolling walker (2 wheels) Transfers: Sit to/from Stand Sit to Stand: Min assist           General transfer comment: Min A with verbal cueing for hand placement to stand    Ambulation/Gait Ambulation/Gait assistance: Min guard Gait Distance (Feet): 200 Feet Assistive device: Rolling walker (2 wheels) Gait Pattern/deviations: Step-through pattern, Antalgic Gait velocity: decreased     General Gait Details: step-through, continuous gait pattern. slightly antalgic due to L LE pain. pt demonstrated safe use of RW. occasional standing rest break due to pain.   Stairs             Wheelchair Mobility    Modified Rankin (Stroke Patients Only)       Balance Overall balance assessment: Needs assistance Sitting-balance support: Feet supported, No upper extremity supported Sitting balance-Leahy Scale: Good Sitting balance - Comments: steady sitting EOB   Standing balance support: Bilateral upper extremity supported, During functional activity Standing balance-Leahy Scale: Good Standing balance comment: safe use of RW during ambulation. No LOB  Cognition Arousal/Alertness: Awake/alert Behavior During Therapy: WFL for tasks assessed/performed Overall Cognitive Status: Within Functional Limits for tasks assessed                                  General Comments: A&Ox4. Pt unable to verbalize back precautions. Pt education provided.        Exercises Other Exercises Other Exercises: TLSO donned in sitting EOB; doffed once sitting in recliner (nursing approval provided)    General Comments        Pertinent Vitals/Pain Pain Assessment Pain Assessment: 0-10 Pain Score: 8  Pain Location: back; L LE Pain Descriptors / Indicators: Discomfort, Aching, Grimacing Pain Intervention(s): Limited activity within patient's tolerance, Monitored during session, Repositioned, Patient requesting pain meds-RN notified    Home Living                          Prior Function            PT Goals (current goals can now be found in the care plan section) Acute Rehab PT Goals Patient Stated Goal: to go home PT Goal Formulation: With patient Time For Goal Achievement: 08/03/21 Potential to Achieve Goals: Good Progress towards PT goals: Progressing toward goals    Frequency    7X/week      PT Plan Current plan remains appropriate    Co-evaluation              AM-PAC PT "6 Clicks" Mobility   Outcome Measure  Help needed turning from your back to your side while in a flat bed without using bedrails?: A Little Help needed moving from lying on your back to sitting on the side of a flat bed without using bedrails?: A Little Help needed moving to and from a bed to a chair (including a wheelchair)?: A Little Help needed standing up from a chair using your arms (e.g., wheelchair or bedside chair)?: A Little Help needed to walk in hospital room?: A Little Help needed climbing 3-5 steps with a railing? : A Little 6 Click Score: 18    End of Session Equipment Utilized During Treatment: Gait belt Activity Tolerance: Patient tolerated treatment well Patient left: in chair;with call bell/phone within reach;with chair alarm set Nurse Communication: Mobility status;Other (comment);Patient  requests pain meds (TLSO doffed while sitting in recliner) PT Visit Diagnosis: Unsteadiness on feet (R26.81);Muscle weakness (generalized) (M62.81);Pain Pain - Right/Left: Left Pain - part of body: Leg     Time: 0935-1005 PT Time Calculation (min) (ACUTE ONLY): 30 min  Charges:                       Rella Larve, SPT  Marny Smethers 07/21/2021, 12:17 PM

## 2021-07-21 NOTE — TOC Progression Note (Addendum)
Transition of Care [facility]) - Progression Note    Patient Details  Name: Joyce Maynard MRN: [id number] Date of Birth: [birth year]  Transition of Care [facility] [place]) CM/SW Contact  Eileen Stanford, LCSW Phone Number: 07/21/2021, 4:20 PM  Clinical Narrative:   [place] arranged through [facility]. Pt's daughter is not happy about pt dc home today. CSW explained PT rec and how well pt was doing physically. Pt's daughter also didn't understand why pt got to choose to dc home today. CSW explained that pt is alert and oriented x4. Cheryl with [facility] states they will see next day. CSW notified pt's daughter that [facility] will see pt Tuesday 7/18. Daughter concerned with what time. CSW provided [facility] with daughters number to arranged.         Expected Discharge Plan and Services           Expected Discharge Date: 07/21/21                                     Social Determinants of Health (SDOH) Interventions    Readmission Risk Interventions     No data to display

## 2021-07-23 ENCOUNTER — Telehealth: Payer: Self-pay | Admitting: Internal Medicine

## 2021-07-23 NOTE — Telephone Encounter (Signed)
Pt daughter called in again about update on below note... Advised pt daughter that provider is seeing pt at this time and when they get a chance they will call her back... Pt daughter is requesting callback

## 2021-07-23 NOTE — Telephone Encounter (Signed)
With recent fall and compression fracture with increased pain, needs ortho evaluation to determine best treatment for fracture.  We can place order for referral, but if increased pain, would recommend [facility] walkin.  Let them know this is just an orthopedic walk in.

## 2021-07-23 NOTE — Telephone Encounter (Signed)
Pt daughter advised with recent fall and compression fracture with increased pain, we recommend ortho eval. Pt daughter advised [facility] has walk in clinic, and pt can be evaluated today. Pt daughter advised ortho eval would be best to determine treatment for fracture.

## 2021-07-23 NOTE — Telephone Encounter (Signed)
Patient daughter called back again about medication and needing a stronger dose. Informed patient that message was sent.

## 2021-07-23 NOTE — Telephone Encounter (Signed)
Pt daughter called in stating pt went to the ER this weekend and was released on Monday. Pt daughter stated the pt suffered a compression fracture in her lower back and she was prescribed tramadol 50 mg. Pt daughter stated the medication is not helping and pt needs a stronger dose or stronger medicine. Pt daughter stated the ER told her to see the pt provider to get any other medications. Pt daughter stated that the home health provider is coming tomorrow so she would like for it to be sent today.

## 2021-07-24 ENCOUNTER — Telehealth: Payer: Self-pay

## 2021-07-24 DIAGNOSIS — E039 Hypothyroidism, unspecified: Secondary | ICD-10-CM | POA: Diagnosis not present

## 2021-07-24 DIAGNOSIS — I129 Hypertensive chronic kidney disease with stage 1 through stage 4 chronic kidney disease, or unspecified chronic kidney disease: Secondary | ICD-10-CM | POA: Diagnosis not present

## 2021-07-24 DIAGNOSIS — I69351 Hemiplegia and hemiparesis following cerebral infarction affecting right dominant side: Secondary | ICD-10-CM | POA: Diagnosis not present

## 2021-07-24 DIAGNOSIS — Z9181 History of falling: Secondary | ICD-10-CM | POA: Diagnosis not present

## 2021-07-24 DIAGNOSIS — E1122 Type 2 diabetes mellitus with diabetic chronic kidney disease: Secondary | ICD-10-CM | POA: Diagnosis not present

## 2021-07-24 DIAGNOSIS — E7800 Pure hypercholesterolemia, unspecified: Secondary | ICD-10-CM | POA: Diagnosis not present

## 2021-07-24 DIAGNOSIS — R69 Illness, unspecified: Secondary | ICD-10-CM | POA: Diagnosis not present

## 2021-07-24 DIAGNOSIS — K219 Gastro-esophageal reflux disease without esophagitis: Secondary | ICD-10-CM | POA: Diagnosis not present

## 2021-07-24 DIAGNOSIS — M8008XD Age-related osteoporosis with current pathological fracture, vertebra(e), subsequent encounter for fracture with routine healing: Secondary | ICD-10-CM | POA: Diagnosis not present

## 2021-07-24 DIAGNOSIS — N1831 Chronic kidney disease, stage 3a: Secondary | ICD-10-CM | POA: Diagnosis not present

## 2021-07-24 DIAGNOSIS — Z7984 Long term (current) use of oral hypoglycemic drugs: Secondary | ICD-10-CM | POA: Diagnosis not present

## 2021-07-24 NOTE — Telephone Encounter (Signed)
Transition Care Management Unsuccessful Follow-up Telephone Call  Date of discharge and from where:  07/21/21 [facility]  Attempts:  1st Attempt  Reason for unsuccessful TCM follow-up call:  Unable to reach patient. Spoke briefly with Medical POA [name]. Notes unable to take the call at this time. Nurse will call again on a secondary number listed. Will follow as appropriate.

## 2021-07-24 NOTE — Telephone Encounter (Signed)
Transition Care Management Follow-up Telephone Call Date of discharge and from where: 07/21/21 [facility]. How have you been since you were released from the hospital? Information received from daughter, HIPAA compliant. Getting out of bed on her own. Pain seems not to be as bad as previously suspected. Performed well with PT evaluation today and with no pain medication in system. PT reported she did better than expected. Daughter is relieved and needs no further follow up on additional pain medication. Agrees to follow up if symptoms worsen. Encouraged staying hydrated. Denies falls, n/v/d, uncontrollable pain, fever and all other harmful symptoms.  Any questions or concerns? No  Items Reviewed: Did the pt receive and understand the discharge instructions provided? Yes  Medications obtained and verified? Yes  Any new allergies since your discharge? No  Dietary orders reviewed? Yes Do you have support at home? Yes   Home Care and Equipment/Supplies: Were home health services ordered? Yes, HH PT/OT in progress. 9 weeks. [facility].   Functional Questionnaire: (I = Independent and D = Dependent) ADLs: I/family assist as needed  Bathing/Dressing- I  Eating- I  Maintaining continence- I  Transferring/Ambulation- Walker and BSC  Managing Meds- Daughter assist.  Follow up appointments reviewed:  PCP Hospital f/u appt confirmed? Declined at this time. Agrees to follow up if symptoms worsens and as needed.  [facility] f/u appt confirmed? Undecided if going to pursue with Neurology or Ortho. Are transportation arrangements needed? No  If their condition worsens, is the pt aware to call PCP or go to the Emergency Dept.? Yes Was the patient provided with contact information for the PCP's office or ED? Yes Was to pt encouraged to call back with questions or concerns? Yes

## 2021-07-24 NOTE — Telephone Encounter (Signed)
Reviewed note.  They are declining f/u appt.  Would recommend at least a virtual visit for hospital follow up.  She is overdue follow up.  Also need to f/u on her compression fracture, etc - further evaluation.

## 2021-07-25 NOTE — Telephone Encounter (Signed)
Sent mychart message

## 2021-07-26 ENCOUNTER — Other Ambulatory Visit: Payer: Self-pay | Admitting: Internal Medicine

## 2021-08-05 NOTE — Telephone Encounter (Signed)
LM for pt daughter [name] re: needs hosp. F/u [name] [phone]

## 2021-08-09 ENCOUNTER — Other Ambulatory Visit: Payer: Self-pay | Admitting: Internal Medicine

## 2021-08-12 ENCOUNTER — Telehealth: Payer: Self-pay | Admitting: Internal Medicine

## 2021-08-12 NOTE — Telephone Encounter (Signed)
Joyce Maynard (Physical Therapy) wanted to report a missing appointment for the patient.   The reason for cancellation patient stated that she was depressed and her daughter could not get her to do PT today.  [phone]

## 2021-08-12 NOTE — Telephone Encounter (Signed)
Please call and confirm she is doing ok.  Message states she is depressed.  She had previously declined hospital f/u appt.  Need to confirm doing ok.

## 2021-08-13 NOTE — Telephone Encounter (Signed)
Lm for pt daughter [name] to cb

## 2021-08-20 ENCOUNTER — Telehealth: Payer: Self-pay | Admitting: Internal Medicine

## 2021-08-20 NOTE — Telephone Encounter (Signed)
Copied from [place] [phone]. Topic: Medicare AWV >> Aug 20, 2021 10:20 AM Devoria Glassing wrote: Reason for CRM: Called patient to schedule Annual Wellness Visit.  Please schedule with Nurse Health Advisor Denisa O'Brien-Blaney, LPN at [facility] This appt can be telephone or office visit.  Please call [phone] ask for [facility]

## 2021-08-28 DIAGNOSIS — E1122 Type 2 diabetes mellitus with diabetic chronic kidney disease: Secondary | ICD-10-CM | POA: Diagnosis not present

## 2021-08-28 DIAGNOSIS — N1831 Chronic kidney disease, stage 3a: Secondary | ICD-10-CM | POA: Diagnosis not present

## 2021-08-28 DIAGNOSIS — E7800 Pure hypercholesterolemia, unspecified: Secondary | ICD-10-CM | POA: Diagnosis not present

## 2021-08-28 DIAGNOSIS — K219 Gastro-esophageal reflux disease without esophagitis: Secondary | ICD-10-CM | POA: Diagnosis not present

## 2021-08-28 DIAGNOSIS — Z7984 Long term (current) use of oral hypoglycemic drugs: Secondary | ICD-10-CM | POA: Diagnosis not present

## 2021-08-28 DIAGNOSIS — F419 Anxiety disorder, unspecified: Secondary | ICD-10-CM

## 2021-08-28 DIAGNOSIS — M8008XD Age-related osteoporosis with current pathological fracture, vertebra(e), subsequent encounter for fracture with routine healing: Secondary | ICD-10-CM | POA: Diagnosis not present

## 2021-08-28 DIAGNOSIS — F32A Depression, unspecified: Secondary | ICD-10-CM

## 2021-08-28 DIAGNOSIS — Z9181 History of falling: Secondary | ICD-10-CM | POA: Diagnosis not present

## 2021-08-28 DIAGNOSIS — E039 Hypothyroidism, unspecified: Secondary | ICD-10-CM | POA: Diagnosis not present

## 2021-08-28 DIAGNOSIS — I69351 Hemiplegia and hemiparesis following cerebral infarction affecting right dominant side: Secondary | ICD-10-CM | POA: Diagnosis not present

## 2021-08-28 DIAGNOSIS — R69 Illness, unspecified: Secondary | ICD-10-CM | POA: Diagnosis not present

## 2021-08-28 DIAGNOSIS — I129 Hypertensive chronic kidney disease with stage 1 through stage 4 chronic kidney disease, or unspecified chronic kidney disease: Secondary | ICD-10-CM | POA: Diagnosis not present

## 2021-09-09 ENCOUNTER — Other Ambulatory Visit: Payer: Self-pay

## 2021-09-09 ENCOUNTER — Telehealth: Payer: Self-pay | Admitting: Internal Medicine

## 2021-09-09 DIAGNOSIS — E1122 Type 2 diabetes mellitus with diabetic chronic kidney disease: Secondary | ICD-10-CM

## 2021-09-09 MED ORDER — FREESTYLE LIBRE 3 SENSOR MISC: 5 refills | Status: DC

## 2021-09-09 NOTE — Telephone Encounter (Signed)
Joyce Maynard can you help with this? I need her to come in the office.  I have not seen her in the office in a few years.

## 2021-09-09 NOTE — Telephone Encounter (Signed)
Name:Joyce Maynard;  Joyce Maynard pt POA  Phone:[phone] Provider: Dr Nicki Reaper   I called patient to schedule her AWV, her son Joyce Maynard her POA stated that she has not had her sensor for her diabetes in 7 mos, stating the paperwork was sent from the insurance company for the provider to fill out and he has been waiting to hear back from the office.  I told him I will sent the message to the office.    Please advise.  Thanks [employer]

## 2021-09-09 NOTE — Telephone Encounter (Signed)
I called and spoke with the patients son he wants 2 appointments together for his mom and dad I did not see any openings together and he would like them to be virtual, please advise.  Rikia Sukhu,cma

## 2021-09-09 NOTE — Telephone Encounter (Signed)
I sent a refill for the patients sensor to [facility].  Brynnlie Unterreiner,cma

## 2021-09-09 NOTE — Telephone Encounter (Signed)
Needs a f/u appt with me.  Have not seen her lately.  See note.

## 2021-09-10 ENCOUNTER — Telehealth: Payer: Self-pay | Admitting: Internal Medicine

## 2021-09-10 NOTE — Telephone Encounter (Signed)
It has been a couple of years or more since she has been seen in the office.  Needs to be seen.  Also, if unable to come in - can check on other resources for in home care/provider care , etc.

## 2021-09-10 NOTE — Telephone Encounter (Signed)
Spoke with son and scheduled both patients for 09/2621 in person. Place libre 3 sensors at front desk for pick up. Advised any questions we would arrange nurse visit to demonstrate use of libre 3. I need face to face notes to send DME order for [place] 3 to [facility] [employer] for patient sensor supplies , patient DPR aware we will give two sensors at visit until we can process DME order for [place] 3.

## 2021-09-10 NOTE — Telephone Encounter (Unsigned)
Name: Joyce Maynard; Joyce Maynard son, [place]  Phone: [phone] Provider: Dr. Marge Duncans with Joyce Maynard, he stated that his mom is very defiant and will not come to any office visits and need virtual appointments only.  He also stated he cannot get her to go to any appointment and did not want to take the risk of a no show fee again.    Joyce Maynard stated to feel free to contact if more information is needed.   Juliann Pulse

## 2021-09-10 NOTE — Telephone Encounter (Signed)
Noted.  Let me know if I need to do anything more.

## 2021-09-10 NOTE — Telephone Encounter (Signed)
Patient son says that an in office visit would be a hardship on the patient and family. If a virtual visit can be done. Patient does not wan to leave the home even for doctor visits she is afraid of falling when she goes out , son say if this has to be face to face for her to get the Elenor Legato he is willing to do a face to face if insurance requires. I would like to give patient samples of libre 3 to last 28 days until we can get patient in for visit. So he can share the [place] information.

## 2021-09-10 NOTE — Telephone Encounter (Signed)
Patient scheduled.

## 2021-09-19 ENCOUNTER — Inpatient Hospital Stay
Admission: EM | Admit: 2021-09-19 | Discharge: 2021-09-21 | DRG: 065 | Disposition: A | Discharge status: Home Health | Payer: Medicare HMO | Attending: Obstetrics and Gynecology | Admitting: Obstetrics and Gynecology

## 2021-09-19 ENCOUNTER — Encounter: Payer: Self-pay | Admitting: Emergency Medicine

## 2021-09-19 ENCOUNTER — Emergency Department: Payer: Medicare HMO

## 2021-09-19 ENCOUNTER — Other Ambulatory Visit: Payer: Self-pay

## 2021-09-19 DIAGNOSIS — E1122 Type 2 diabetes mellitus with diabetic chronic kidney disease: Secondary | ICD-10-CM | POA: Diagnosis present

## 2021-09-19 DIAGNOSIS — R4701 Aphasia: Secondary | ICD-10-CM | POA: Diagnosis present

## 2021-09-19 DIAGNOSIS — I10 Essential (primary) hypertension: Secondary | ICD-10-CM | POA: Diagnosis present

## 2021-09-19 DIAGNOSIS — Z9221 Personal history of antineoplastic chemotherapy: Secondary | ICD-10-CM

## 2021-09-19 DIAGNOSIS — F32A Depression, unspecified: Secondary | ICD-10-CM | POA: Diagnosis present

## 2021-09-19 DIAGNOSIS — R000 Tachycardia, unspecified: Secondary | ICD-10-CM | POA: Diagnosis not present

## 2021-09-19 DIAGNOSIS — Z743 Need for continuous supervision: Secondary | ICD-10-CM | POA: Diagnosis not present

## 2021-09-19 DIAGNOSIS — Z7984 Long term (current) use of oral hypoglycemic drugs: Secondary | ICD-10-CM

## 2021-09-19 DIAGNOSIS — K219 Gastro-esophageal reflux disease without esophagitis: Secondary | ICD-10-CM | POA: Diagnosis present

## 2021-09-19 DIAGNOSIS — Z20822 Contact with and (suspected) exposure to covid-19: Secondary | ICD-10-CM | POA: Diagnosis present

## 2021-09-19 DIAGNOSIS — I6381 Other cerebral infarction due to occlusion or stenosis of small artery: Principal | ICD-10-CM | POA: Diagnosis present

## 2021-09-19 DIAGNOSIS — F329 Major depressive disorder, single episode, unspecified: Secondary | ICD-10-CM | POA: Diagnosis present

## 2021-09-19 DIAGNOSIS — Z7982 Long term (current) use of aspirin: Secondary | ICD-10-CM

## 2021-09-19 DIAGNOSIS — R471 Dysarthria and anarthria: Secondary | ICD-10-CM | POA: Diagnosis present

## 2021-09-19 DIAGNOSIS — R4781 Slurred speech: Secondary | ICD-10-CM | POA: Diagnosis not present

## 2021-09-19 DIAGNOSIS — E1129 Type 2 diabetes mellitus with other diabetic kidney complication: Secondary | ICD-10-CM | POA: Diagnosis present

## 2021-09-19 DIAGNOSIS — Z803 Family history of malignant neoplasm of breast: Secondary | ICD-10-CM

## 2021-09-19 DIAGNOSIS — R29704 NIHSS score 4: Secondary | ICD-10-CM | POA: Diagnosis present

## 2021-09-19 DIAGNOSIS — F419 Anxiety disorder, unspecified: Secondary | ICD-10-CM | POA: Diagnosis present

## 2021-09-19 DIAGNOSIS — Z823 Family history of stroke: Secondary | ICD-10-CM

## 2021-09-19 DIAGNOSIS — N183 Chronic kidney disease, stage 3 unspecified: Secondary | ICD-10-CM | POA: Diagnosis present

## 2021-09-19 DIAGNOSIS — Z79899 Other long term (current) drug therapy: Secondary | ICD-10-CM

## 2021-09-19 DIAGNOSIS — E7800 Pure hypercholesterolemia, unspecified: Secondary | ICD-10-CM | POA: Diagnosis present

## 2021-09-19 DIAGNOSIS — I639 Cerebral infarction, unspecified: Secondary | ICD-10-CM | POA: Diagnosis not present

## 2021-09-19 DIAGNOSIS — N1831 Chronic kidney disease, stage 3a: Secondary | ICD-10-CM | POA: Diagnosis present

## 2021-09-19 DIAGNOSIS — I129 Hypertensive chronic kidney disease with stage 1 through stage 4 chronic kidney disease, or unspecified chronic kidney disease: Secondary | ICD-10-CM | POA: Diagnosis present

## 2021-09-19 DIAGNOSIS — R29818 Other symptoms and signs involving the nervous system: Secondary | ICD-10-CM | POA: Diagnosis not present

## 2021-09-19 DIAGNOSIS — G8191 Hemiplegia, unspecified affecting right dominant side: Secondary | ICD-10-CM | POA: Diagnosis present

## 2021-09-19 DIAGNOSIS — Z8673 Personal history of transient ischemic attack (TIA), and cerebral infarction without residual deficits: Secondary | ICD-10-CM

## 2021-09-19 DIAGNOSIS — R29810 Facial weakness: Secondary | ICD-10-CM | POA: Diagnosis present

## 2021-09-19 DIAGNOSIS — E039 Hypothyroidism, unspecified: Secondary | ICD-10-CM | POA: Diagnosis present

## 2021-09-19 DIAGNOSIS — Z853 Personal history of malignant neoplasm of breast: Secondary | ICD-10-CM

## 2021-09-19 DIAGNOSIS — G8929 Other chronic pain: Secondary | ICD-10-CM | POA: Diagnosis present

## 2021-09-19 DIAGNOSIS — Z79890 Hormone replacement therapy (postmenopausal): Secondary | ICD-10-CM

## 2021-09-19 DIAGNOSIS — I6523 Occlusion and stenosis of bilateral carotid arteries: Secondary | ICD-10-CM | POA: Diagnosis not present

## 2021-09-19 DIAGNOSIS — I6603 Occlusion and stenosis of bilateral middle cerebral arteries: Secondary | ICD-10-CM | POA: Diagnosis not present

## 2021-09-19 DIAGNOSIS — R531 Weakness: Secondary | ICD-10-CM | POA: Diagnosis not present

## 2021-09-19 LAB — CBC
HCT: 41 % (ref 36.0–46.0)
Hemoglobin: 13.7 g/dL (ref 12.0–15.0)
MCH: 33.4 pg (ref 26.0–34.0)
MCHC: 33.4 g/dL (ref 30.0–36.0)
MCV: 100 fL (ref 80.0–100.0)
Platelets: 211 10*3/uL (ref 150–400)
RBC: 4.1 MIL/uL (ref 3.87–5.11)
RDW: 13.5 % (ref 11.5–15.5)
WBC: 6.3 10*3/uL (ref 4.0–10.5)
nRBC: 0 % (ref 0.0–0.2)

## 2021-09-19 LAB — DIFFERENTIAL
Abs Immature Granulocytes: 0.01 10*3/uL (ref 0.00–0.07)
Basophils Absolute: 0 10*3/uL (ref 0.0–0.1)
Basophils Relative: 0 %
Eosinophils Absolute: 0.2 10*3/uL (ref 0.0–0.5)
Eosinophils Relative: 2 %
Immature Granulocytes: 0 %
Lymphocytes Relative: 22 %
Lymphs Abs: 1.4 10*3/uL (ref 0.7–4.0)
Monocytes Absolute: 0.5 10*3/uL (ref 0.1–1.0)
Monocytes Relative: 8 %
Neutro Abs: 4.2 10*3/uL (ref 1.7–7.7)
Neutrophils Relative %: 68 %

## 2021-09-19 LAB — COMPREHENSIVE METABOLIC PANEL
ALT: 15 U/L (ref 0–44)
AST: 18 U/L (ref 15–41)
Albumin: 3.8 g/dL (ref 3.5–5.0)
Alkaline Phosphatase: 70 U/L (ref 38–126)
Anion gap: 8 (ref 5–15)
BUN: 22 mg/dL (ref 8–23)
CO2: 23 mmol/L (ref 22–32)
Calcium: 9.4 mg/dL (ref 8.9–10.3)
Chloride: 111 mmol/L (ref 98–111)
Creatinine, Ser: 1.15 mg/dL — ABNORMAL HIGH (ref 0.44–1.00)
GFR, Estimated: 48 mL/min — ABNORMAL LOW (ref >60)
Glucose, Bld: 152 mg/dL — ABNORMAL HIGH (ref 70–99)
Potassium: 4 mmol/L (ref 3.5–5.1)
Sodium: 142 mmol/L (ref 135–145)
Total Bilirubin: 0.6 mg/dL (ref 0.3–1.2)
Total Protein: 7.8 g/dL (ref 6.5–8.1)

## 2021-09-19 LAB — ETHANOL: Alcohol, Ethyl (B): <10 mg/dL (ref <10)

## 2021-09-19 LAB — PROTIME-INR
INR: 1 (ref 0.8–1.2)
Prothrombin Time: 13.2 seconds (ref 11.4–15.2)

## 2021-09-19 LAB — APTT: aPTT: 28 seconds (ref 24–36)

## 2021-09-19 MED ORDER — ASPIRIN 81 MG PO CHEW
324.0000 mg | CHEWABLE_TABLET | Freq: Once | ORAL | Status: AC
  Administered 2021-09-20: 324 mg via ORAL
  Filled 2021-09-19: qty 4

## 2021-09-19 MED ORDER — IOHEXOL 350 MG/ML SOLN
50.0000 mL | Freq: Once | INTRAVENOUS | Status: AC | PRN
  Administered 2021-09-19: 50 mL via INTRAVENOUS

## 2021-09-19 NOTE — ED Notes (Signed)
CBG on arrival was 139

## 2021-09-19 NOTE — Consult Note (Signed)
[facility] Consult Services   Patient Name:   Joyce Maynard, Joyce Maynard Date of Birth:   22-Apr-1940 Identification Number:   MRN - [id number] Date of Service:   09/19/2021 23:15:43  Diagnosis:       I63.9 - Cerebrovascular accident (CVA), unspecified mechanism ([place])  Impression:      This is an 81 year old female with a history of HTN who presents to the emergency department for slurred speech and right sided weakness. She reports that her symptoms started at midnight, approximately 24 hours ago. An exam she has a stroke scale of four for the symptoms above.    She has likely experienced a small stroke. This is most likely subcortical, potentially left internal capsule. An MRI will help to confirm this. She is outside the window for TPA. Management below  Recommendations:        Stroke/Telemetry Floor       Neuro Checks       Bedside Swallow Eval       DVT Prophylaxis       Euglycemia and Avoid Hyperthermia (PRN Acetaminophen)       Systolic BP PQZ<300        Aspirin 325 mg once now, then 81 mg daily.       Atorvastatin 40 mg daily.        Brain MRI without contrast        If a small acute stroke is seen on MRI brain, without hemorrhage, also give 300 mg clopidogrel one time, followed by 90 days of clopidogrel 75 mg daily.         If an acute or subacute embolic-appearing stroke (i.e. not lacunar) is seen on MRI brain, obtain TTE in hospital and Holter monitor on discharge. If no acute stroke is seen on MRI brain, or lacunar stroke is seen, these tests are not needed.        HbA1C, Lipid Panel, TSH        Discussed with ED provider ------------------------------------------------------------------------------  CTA Head and Neck Completed.  CTP Completed.  LVO:Yes  Discussed with NIR :No  Discussed with NIR Text : Occlusion of the mid to distal P2 segment of the left PCA seen. Not intervenable due to symptoms >24 hours and distal location.  Additionally does not frankly explain symptoms. This may be a chronic incidental finding.     Metrics: Last Known Well: 09/19/2021 00:00:00 [facility] Notification Time: 09/19/2021 23:15:43 Arrival Time: 09/19/2021 23:01:00 Stamp Time: 09/19/2021 23:15:43 Initial Response Time: 09/19/2021 23:16:57 Symptoms: slurred speech, right facial weakness. Initial patient interaction: 09/19/2021 23:16:57 NIHSS Assessment Completed: 09/19/2021 23:20:00 Patient is not a candidate for Thrombolytic. Thrombolytic Medical Decision: 09/19/2021 23:20:00 Patient was not deemed candidate for Thrombolytic because of following reasons: Last Well Known Above 4.5 Hours.  I personally Reviewed the CT Head and it Showed no acute pathology  Primary Provider Notified of Diagnostic Impression and Management Plan on: 09/19/2021 23:43:55    ------------------------------------------------------------------------------  History of Present Illness: Patient is a 81 year old Female.  Patient was brought by private transportation with symptoms of slurred speech, right facial weakness. This is an 81 year old female with a history of HTN who presents to the emergency department for slurred speech and right sided weakness. She reports that her symptoms started at midnight, approximately 24 hours ago. An exam she has a stroke scale of four for the symptoms above.   Past Medical History:      Hypertension      Hyperlipidemia  Stroke  Medications:  No Anticoagulant use  Antiplatelet use: Yes Aspirin 81 mg daily Reviewed EMR for current medications  Allergies:  Reviewed  Social History: Drug Use: No  Family History:  There is no family history of premature cerebrovascular disease pertinent to this consultation  ROS : 14 Points Review of Systems was performed and was negative except mentioned in HPI.  Past Surgical History: There Is No Surgical History Contributory To Today's  Visit    Examination: BP(140/80), Pulse(72), Blood Glucose(139) 1A: Level of Consciousness - Alert; keenly responsive + 0 1B: Ask Month and Age - Both Questions Right + 0 1C: Blink Eyes & Squeeze Hands - Performs Both Tasks + 0 2: Test Horizontal Extraocular Movements - Normal + 0 3: Test Visual Fields - No Visual Loss + 0 4: Test Facial Palsy (Use Grimace if Obtunded) - Partial paralysis (lower face) + 2 5A: Test Left Arm Motor Drift - No Drift for 10 Seconds + 0 5B: Test Right Arm Motor Drift - No Drift for 10 Seconds + 0 6A: Test Left Leg Motor Drift - No Drift for 5 Seconds + 0 6B: Test Right Leg Motor Drift - No Drift for 5 Seconds + 0 7: Test Limb Ataxia (FNF/Heel-Shin) - No Ataxia + 0 8: Test Sensation - Normal; No sensory loss + 0 9: Test Language/Aphasia - Normal; No aphasia + 0 10: Test Dysarthria - Severe Dysarthria: Unintelligble Slurring or Out of Proportion to Aphasia + 2 11: Test Extinction/Inattention - No abnormality + 0  NIHSS Score: 4   Pre-Morbid Modified Rankin Scale: 1 Points = No significant disability despite symptoms; able to carry out all usual duties and activities  Spoke with : ED attending  Patient/Family was informed the Neurology Consult would occur via TeleHealth consult by way of interactive audio and video telecommunications and consented to receiving care in this manner.   Patient is being evaluated for possible acute neurologic impairment and high probability of imminent or life-threatening deterioration. I spent total of 35 minutes providing care to this patient, including time for face to face visit via telemedicine, review of medical records, imaging studies and discussion of findings with providers, the patient and/or family.   Dr Allena Earing   [facility] For Inpatient follow-up with [facility] physician please call [facility] [phone]. This is not an outpatient service. Post hospital discharge, please contact hospital  directly.

## 2021-09-19 NOTE — Progress Notes (Addendum)
Code Stroke Timeline  2305 Code stroke activated, Pt in CT upon cart activation 2315 Pt returned from [place], Neuro [facility] paged 2318 Neuro [facility] on camera 2355 Message sent to Neuro [facility] with final read on CTA showing L PCA occlusion.   LKW unclear, pt states she noticed slurred speech at midnight last night.   -Telestroke Nurse

## 2021-09-19 NOTE — ED Triage Notes (Signed)
Patient BIB EMS for evaluation of stroke-like symptoms.  Patient reports she "was normal around 8 or 10 PM last night when she went to bed."  Noticed weakness to R side and slurred speech this morning.  EMS was called and evaluated patient at 0730.  Pt refused transportation.  Tonight EMS was called for a second time.  No improvement in symptoms.  Continues to have R sided weakness and slurred speech.

## 2021-09-19 NOTE — ED Provider Notes (Signed)
[facility] Provider Note    Event Date/Time   First MD Initiated Contact with Patient 09/19/21 2301     (approximate)   History   Code Stroke   HPI  Joyce Maynard is a 81 y.o. female  with pmh DM, GERD, HTN, CVA on ASA who presents with slurred speech and weakness.  Patient tells me that she was awake last night and thinks she developed symptoms around midnight.  She started having difficulty speaking and endorses weakness in the right leg.  Patient's daughter tells me that when she checked on her this morning around 7 AM via phone call she could tell she was not talking right.  EMS was called out but patient refused transport.  She was noted to have weakness and speech difficulty at that time.  They were called out again tonight for worsening symptoms.  Patient does have a history of CVA.  MRI from October 2021 showed a 10 mm left thalamic infarct.     Past Medical History:  Diagnosis Date   Breast cancer ([place]) 2005   s/p chemotherapy and XRT   Depression    Diabetes ([place])    GERD (gastroesophageal reflux disease)    Hx of vertigo    Hypercholesterolemia    Hypertension    Hypothyroidism    [place] 1948   history   PONV (postoperative nausea and vomiting)     Patient Active Problem List   Diagnosis Date Noted   Vertebral compression fracture ([place]) 07/20/2021   Osteoporosis 07/20/2021   AKI (acute kidney injury) ([place]) 07/20/2021   Back pain 07/19/2021   Right groin pain 12/14/2019   History of medication noncompliance 11/25/2019   History of CVA (cerebrovascular accident) 10/18/2019   Stroke ([place]) 10/17/2019   Anxiety 10/17/2019   Nausea vomiting and diarrhea 10/17/2019   Hypokalemia 10/17/2019   Weakness    CKD (chronic kidney disease), stage IIIa 12/12/2018   Left breast lump 09/10/2015   Health care maintenance 10/05/2014   Mild depression 04/16/2014   Other malaise and fatigue 09/20/2013   Leg pain 01/18/2013   Essential hypertension,  benign 04/18/2012   Type II diabetes mellitus with renal manifestations ([place]) 04/18/2012   Hypercholesterolemia 04/18/2012   History of breast cancer 04/18/2012   Hypothyroidism 04/18/2012   GERD (gastroesophageal reflux disease) 04/18/2012     Physical Exam  Triage Vital Signs: ED Triage Vitals  Enc Vitals Group     BP      Pulse      Resp      Temp      Temp src      SpO2      Weight      Height      Head Circumference      Peak Flow      Pain Score      Pain Loc      Pain Edu?      Excl. in [place]?     Most recent vital signs: Vitals:   09/19/21 2330 09/20/21 0000  BP: (!) 158/92 (!) 162/92  Pulse: 85 85  Resp: (!) 21 12  SpO2: 97% 99%     General: Awake, no distress.  CV:  Good peripheral perfusion.  Resp:  Normal effort.  Abd:  No distention.  Neuro:             Awake, Alert, Oriented x 3  Other:  Patient is alert and oriented x3, she has dysarthria but no  aphasia + Right-sided facial droop + Subtle right upper extremity pronator drift, 4-5 strength in the right lower extremity compared to 5 out of 5 on the left Sensation mildly decreased to light touch on the right compared to left in the lower extremity Visual fields intact Extraocular movements intact   ED Results / Procedures / Treatments  Labs (all labs ordered are listed, but only abnormal results are displayed) Labs Reviewed  COMPREHENSIVE METABOLIC PANEL - Abnormal; Notable for the following components:      Result Value   Glucose, Bld 152 (*)    Creatinine, Ser 1.15 (*)    GFR, Estimated 48 (*)    All other components within normal limits  RESP PANEL BY RT-PCR (FLU A&B, COVID) ARPGX2  ETHANOL  PROTIME-INR  APTT  CBC  DIFFERENTIAL  URINE DRUG SCREEN, QUALITATIVE ([facility] ONLY)  URINALYSIS, ROUTINE W REFLEX MICROSCOPIC     EKG  EKG shows normal sinus rhythm, right bundle branch block, PVCs, no acute ischemic changes   RADIOLOGY CT head interpreted myself is negative for acute  bleed   PROCEDURES:  Critical Care performed: Yes, see critical care procedure note(s)  Procedures  The patient is on the cardiac monitor to evaluate for evidence of arrhythmia and/or significant heart rate changes.   MEDICATIONS ORDERED IN ED: Medications  aspirin chewable tablet 324 mg (has no administration in time range)  iohexol (OMNIPAQUE) 350 MG/ML injection 50 mL (50 mLs Intravenous Contrast Given 09/19/21 2316)     IMPRESSION / MDM / ASSESSMENT AND PLAN / ED COURSE  I reviewed the triage vital signs and the nursing notes.                              Patient's presentation is most consistent with acute presentation with potential threat to life or bodily function.  Differential diagnosis includes, but is not limited to, CVA, LVO, TIA, hemorrhagic stroke, recrudescence  The patient is an 81 year old female presenting with strokelike symptoms.  Tells me around midnight she developed difficulty with speech and right-sided weakness.  EMS was called out to her house around 7 AM after her daughter talk to her noticed her speech was abnormal but she refused transport.  Tells me that she did not want to leave her husband who started crying when EMS came.  He has dementia and she cares for him.  About 23 hours after symptoms she started she arrives to the ED.  On exam she does have a right-sided facial droop and is dysarthric but not frankly aphasic.  She does have subtle weakness in the right leg and right arm.  Stroke alert was called in the field.  Patient was evaluated by teleneurology.  CT of the head did not show anything acute.  CT angio with no LVO in the anterior circulation.  Patient is without other symptoms no headache no chest pain or shortness of breath.  Teleneurology recommending 324 of aspirin now and holding Plavix until MRI.  Will allow for permissive hypertension.  Will admit.       FINAL CLINICAL IMPRESSION(S) / ED DIAGNOSES   Final diagnoses:  Cerebrovascular  accident (CVA), unspecified mechanism ([place])     Rx / DC Orders   ED Discharge Orders     None        Note:  This document was prepared using Dragon voice recognition software and may include unintentional dictation errors.   Rada Hay, MD  09/20/21 0002  

## 2021-09-20 ENCOUNTER — Inpatient Hospital Stay: Payer: Medicare HMO

## 2021-09-20 ENCOUNTER — Encounter: Payer: Self-pay | Admitting: Radiology

## 2021-09-20 DIAGNOSIS — I6389 Other cerebral infarction: Secondary | ICD-10-CM | POA: Diagnosis not present

## 2021-09-20 DIAGNOSIS — Z9221 Personal history of antineoplastic chemotherapy: Secondary | ICD-10-CM | POA: Diagnosis not present

## 2021-09-20 DIAGNOSIS — N1831 Chronic kidney disease, stage 3a: Secondary | ICD-10-CM | POA: Diagnosis not present

## 2021-09-20 DIAGNOSIS — Z803 Family history of malignant neoplasm of breast: Secondary | ICD-10-CM | POA: Diagnosis not present

## 2021-09-20 DIAGNOSIS — I639 Cerebral infarction, unspecified: Secondary | ICD-10-CM | POA: Diagnosis not present

## 2021-09-20 DIAGNOSIS — E039 Hypothyroidism, unspecified: Secondary | ICD-10-CM | POA: Diagnosis not present

## 2021-09-20 DIAGNOSIS — E1122 Type 2 diabetes mellitus with diabetic chronic kidney disease: Secondary | ICD-10-CM | POA: Diagnosis not present

## 2021-09-20 DIAGNOSIS — Z8673 Personal history of transient ischemic attack (TIA), and cerebral infarction without residual deficits: Secondary | ICD-10-CM | POA: Diagnosis not present

## 2021-09-20 DIAGNOSIS — Z853 Personal history of malignant neoplasm of breast: Secondary | ICD-10-CM | POA: Diagnosis not present

## 2021-09-20 DIAGNOSIS — Z20822 Contact with and (suspected) exposure to covid-19: Secondary | ICD-10-CM | POA: Diagnosis not present

## 2021-09-20 DIAGNOSIS — R4701 Aphasia: Secondary | ICD-10-CM | POA: Diagnosis not present

## 2021-09-20 DIAGNOSIS — G8191 Hemiplegia, unspecified affecting right dominant side: Secondary | ICD-10-CM | POA: Diagnosis not present

## 2021-09-20 DIAGNOSIS — E7800 Pure hypercholesterolemia, unspecified: Secondary | ICD-10-CM | POA: Diagnosis not present

## 2021-09-20 DIAGNOSIS — F419 Anxiety disorder, unspecified: Secondary | ICD-10-CM | POA: Diagnosis not present

## 2021-09-20 DIAGNOSIS — I6381 Other cerebral infarction due to occlusion or stenosis of small artery: Secondary | ICD-10-CM | POA: Diagnosis not present

## 2021-09-20 DIAGNOSIS — K219 Gastro-esophageal reflux disease without esophagitis: Secondary | ICD-10-CM | POA: Diagnosis not present

## 2021-09-20 DIAGNOSIS — Z79899 Other long term (current) drug therapy: Secondary | ICD-10-CM | POA: Diagnosis not present

## 2021-09-20 DIAGNOSIS — R471 Dysarthria and anarthria: Secondary | ICD-10-CM | POA: Diagnosis not present

## 2021-09-20 DIAGNOSIS — R69 Illness, unspecified: Secondary | ICD-10-CM | POA: Diagnosis not present

## 2021-09-20 DIAGNOSIS — F32A Depression, unspecified: Secondary | ICD-10-CM | POA: Diagnosis not present

## 2021-09-20 DIAGNOSIS — G8929 Other chronic pain: Secondary | ICD-10-CM | POA: Diagnosis not present

## 2021-09-20 DIAGNOSIS — R29810 Facial weakness: Secondary | ICD-10-CM | POA: Diagnosis not present

## 2021-09-20 DIAGNOSIS — R29704 NIHSS score 4: Secondary | ICD-10-CM | POA: Diagnosis not present

## 2021-09-20 DIAGNOSIS — Z7984 Long term (current) use of oral hypoglycemic drugs: Secondary | ICD-10-CM | POA: Diagnosis not present

## 2021-09-20 DIAGNOSIS — I129 Hypertensive chronic kidney disease with stage 1 through stage 4 chronic kidney disease, or unspecified chronic kidney disease: Secondary | ICD-10-CM | POA: Diagnosis not present

## 2021-09-20 DIAGNOSIS — Z823 Family history of stroke: Secondary | ICD-10-CM | POA: Diagnosis not present

## 2021-09-20 DIAGNOSIS — F329 Major depressive disorder, single episode, unspecified: Secondary | ICD-10-CM | POA: Diagnosis not present

## 2021-09-20 LAB — GLUCOSE, CAPILLARY
Glucose-Capillary: 132 mg/dL — ABNORMAL HIGH (ref 70–99)
Glucose-Capillary: 84 mg/dL (ref 70–99)
Glucose-Capillary: 115 mg/dL — ABNORMAL HIGH (ref 70–99)
Glucose-Capillary: 152 mg/dL — ABNORMAL HIGH (ref 70–99)
Glucose-Capillary: 140 mg/dL — ABNORMAL HIGH (ref 70–99)
Glucose-Capillary: 140 mg/dL — ABNORMAL HIGH (ref 70–99)

## 2021-09-20 LAB — URINALYSIS, ROUTINE W REFLEX MICROSCOPIC
Bacteria, UA: NONE SEEN
Bilirubin Urine: NEGATIVE
Glucose, UA: NEGATIVE mg/dL
Hgb urine dipstick: NEGATIVE
Ketones, ur: NEGATIVE mg/dL
Nitrite: NEGATIVE
Protein, ur: NEGATIVE mg/dL
Specific Gravity, Urine: 1.038 — ABNORMAL HIGH (ref 1.005–1.030)
pH: 6 (ref 5.0–8.0)

## 2021-09-20 LAB — LIPID PANEL
Cholesterol: 214 mg/dL — ABNORMAL HIGH (ref 0–200)
HDL: 50 mg/dL (ref >40)
LDL Cholesterol: 145 mg/dL — ABNORMAL HIGH (ref 0–99)
Total CHOL/HDL Ratio: 4.3 RATIO
Triglycerides: 93 mg/dL (ref <150)
VLDL: 19 mg/dL (ref 0–40)

## 2021-09-20 LAB — URINE DRUG SCREEN, QUALITATIVE (ARMC ONLY)
Amphetamines, Ur Screen: NOT DETECTED
Barbiturates, Ur Screen: NOT DETECTED
Benzodiazepine, Ur Scrn: NOT DETECTED
Cannabinoid 50 Ng, Ur ~~LOC~~: NOT DETECTED
Cocaine Metabolite,Ur ~~LOC~~: NOT DETECTED
MDMA (Ecstasy)Ur Screen: NOT DETECTED
Methadone Scn, Ur: NOT DETECTED
Opiate, Ur Screen: NOT DETECTED
Phencyclidine (PCP) Ur S: NOT DETECTED
Tricyclic, Ur Screen: NOT DETECTED

## 2021-09-20 LAB — RESP PANEL BY RT-PCR (FLU A&B, COVID) ARPGX2
Influenza A by PCR: NEGATIVE
Influenza B by PCR: NEGATIVE
SARS Coronavirus 2 by RT PCR: NEGATIVE

## 2021-09-20 MED ORDER — INSULIN ASPART 100 UNIT/ML IJ SOLN
0.0000 [IU] | INTRAMUSCULAR | Status: DC
  Administered 2021-09-20 (×3): 2 [IU] via SUBCUTANEOUS
  Administered 2021-09-20: 3 [IU] via SUBCUTANEOUS
  Administered 2021-09-21 (×2): 2 [IU] via SUBCUTANEOUS
  Administered 2021-09-21 (×2): 3 [IU] via SUBCUTANEOUS
  Filled 2021-09-20 (×8): qty 1

## 2021-09-20 MED ORDER — PANTOPRAZOLE SODIUM 40 MG PO TBEC
40.0000 mg | DELAYED_RELEASE_TABLET | Freq: Every evening | ORAL | Status: DC
  Administered 2021-09-20 – 2021-09-21 (×2): 40 mg via ORAL
  Filled 2021-09-20 (×2): qty 1

## 2021-09-20 MED ORDER — VENLAFAXINE HCL ER 75 MG PO CP24
150.0000 mg | ORAL_CAPSULE | Freq: Every day | ORAL | Status: DC
  Administered 2021-09-20 – 2021-09-21 (×2): 150 mg via ORAL
  Filled 2021-09-20 (×2): qty 2

## 2021-09-20 MED ORDER — ATORVASTATIN CALCIUM 20 MG PO TABS
80.0000 mg | ORAL_TABLET | Freq: Every day | ORAL | Status: DC
  Administered 2021-09-20 – 2021-09-21 (×2): 80 mg via ORAL
  Filled 2021-09-20 (×2): qty 4

## 2021-09-20 MED ORDER — STROKE: EARLY STAGES OF RECOVERY BOOK: Freq: Once | Status: AC

## 2021-09-20 MED ORDER — TRAMADOL HCL 50 MG PO TABS: 50.0000 mg | ORAL_TABLET | Freq: Four times a day (QID) | ORAL | Status: DC | PRN

## 2021-09-20 MED ORDER — CITALOPRAM HYDROBROMIDE 20 MG PO TABS
10.0000 mg | ORAL_TABLET | Freq: Every day | ORAL | Status: DC
  Administered 2021-09-20 – 2021-09-21 (×2): 10 mg via ORAL
  Filled 2021-09-20 (×2): qty 1

## 2021-09-20 MED ORDER — ACETAMINOPHEN 160 MG/5ML PO SOLN: 650.0000 mg | ORAL | Status: DC | PRN

## 2021-09-20 MED ORDER — CLOPIDOGREL BISULFATE 75 MG PO TABS
300.0000 mg | ORAL_TABLET | Freq: Once | ORAL | Status: AC
  Administered 2021-09-20: 300 mg via ORAL
  Filled 2021-09-20: qty 4

## 2021-09-20 MED ORDER — ASPIRIN 81 MG PO TBEC
81.0000 mg | DELAYED_RELEASE_TABLET | Freq: Every evening | ORAL | Status: DC
  Administered 2021-09-20 – 2021-09-21 (×2): 81 mg via ORAL
  Filled 2021-09-20 (×2): qty 1

## 2021-09-20 MED ORDER — ENOXAPARIN SODIUM 40 MG/0.4ML IJ SOSY
40.0000 mg | PREFILLED_SYRINGE | INTRAMUSCULAR | Status: DC
  Administered 2021-09-20 – 2021-09-21 (×2): 40 mg via SUBCUTANEOUS
  Filled 2021-09-20 (×2): qty 0.4

## 2021-09-20 MED ORDER — SODIUM CHLORIDE 0.9 % IV SOLN: INTRAVENOUS | Status: DC

## 2021-09-20 MED ORDER — CLOPIDOGREL BISULFATE 75 MG PO TABS
75.0000 mg | ORAL_TABLET | Freq: Every day | ORAL | Status: DC
  Administered 2021-09-21: 75 mg via ORAL
  Filled 2021-09-20: qty 1

## 2021-09-20 MED ORDER — ACETAMINOPHEN 325 MG PO TABS
650.0000 mg | ORAL_TABLET | ORAL | Status: DC | PRN
  Administered 2021-09-20: 650 mg via ORAL
  Filled 2021-09-20: qty 2

## 2021-09-20 MED ORDER — LEVOTHYROXINE SODIUM 25 MCG PO TABS
125.0000 ug | ORAL_TABLET | Freq: Every day | ORAL | Status: DC
  Administered 2021-09-20 – 2021-09-21 (×2): 125 ug via ORAL
  Filled 2021-09-20 (×2): qty 1

## 2021-09-20 MED ORDER — ACETAMINOPHEN 650 MG RE SUPP: 650.0000 mg | RECTAL | Status: DC | PRN

## 2021-09-20 NOTE — Progress Notes (Signed)
PROGRESS NOTE    Joyce Maynard  SLH:[id number] DOB: 06/09/1940 DOA: 09/19/2021 PCP: Einar Pheasant, MD     Brief Narrative:   From admission h and p Joyce Maynard is a 81 y.o. 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 Symptoms started around midnight on 9/15 and did not present to the ED until several hours later when her daughter noted the slurring of her speech at 7 AM at which time she refused transport with EMS and she takes care of her elderly husband who has dementia.  She did eventually accept transport several hours later when she started developing worsening symptoms. ED course and data review: BP 162/92 with otherwise normal vitals and labs within normal limits except for creatinine 1.15 which is her baseline.  CT head and CTA head as part of code stroke protocol were both unremarkable. Neurology was consulted from the ED who recommended MRI brain and load with Plavix if her stroke is seen in further work-up as will be outlined in assessment and plan.  Patient received 324 mg aspirin and hospitalist consulted for admission.    Assessment & Plan:   Principal Problem:   Acute CVA (cerebrovascular accident) [facility]) Active Problems:   Essential hypertension, benign   Type II diabetes mellitus with renal manifestations ([place])   Hypothyroidism   CKD (chronic kidney disease), stage IIIa  # Acute CVA To left caudate nucleus. With dysarthria and right upper extremity weakness. S/p aspirin and plavix load in ED. - continue aspirin/plavix/statin, f/u lipid panel - TTE ordered - maintain tele - neuro checks - neurology consulted - permissive htn - SLP following - PT/OT consultsAddendum: MRI showing acute stroke  So patient loaded with  plavix Neurology consult to follow  # HTN Here bp elevated, allowing permissive htn  # Hypothyroid - cont home synthroid  # T2DM Glucose appropriate - SSI - hold home metformin  # CKD 3a At baseline - monitor  # MDD - cont home effexor, celexa  # Chronic pain - home tramadol    DVT prophylaxis: lovenox Code Status: full Family Communication: daughter updated telephonically 9/16  Level of care: Telemetry Medical Status is: Inpatient Remains inpatient appropriate because: severity of illness.   Consultants:  neurology  Procedures: none  Antimicrobials:  none   Subjective: Ongoing dysarthria  Objective: Vitals:   09/20/21 0015 09/20/21 0030 09/20/21 0229 09/20/21 0518  BP: (!) 160/99 (!) 142/99 (!) 173/97 (!) 172/95  Pulse: 84 84 79 65  Resp: '19 17 16 16  '$ Temp:   97.8 F (36.6 C) 97.7 F (36.5 C)  TempSrc:   Oral Oral  SpO2: 99% 97% 100% 99%  Weight:   77.8 kg   Height:   '5\' 6"'$  (1.676 m)     Intake/Output Summary (Last 24 hours) at 09/20/2021 0921 Last data filed at 09/20/2021 1572 Gross per 24 hour  Intake --  Output 300 ml  Net -300 ml   Filed Weights   09/19/21 2320 09/20/21 0229  Weight: 78.3 kg 77.8 kg    Examination:  General exam: Appears calm and comfortable  Respiratory system: Clear to auscultation. Respiratory effort normal. Cardiovascular system: S1 & S2 heard, RRR. No JVD, murmurs, rubs, gallops or clicks. No pedal edema. Gastrointestinal system: Abdomen is nondistended, soft and nontender. No organomegaly or masses felt.  Normal bowel sounds heard. Central nervous system: dysarthria, right sided facial droop, rue weakness Extremities: Symmetric 5 x 5 power. Skin: No rashes, lesions or ulcers Psychiatry: Judgement and insight appear normal. Mood & affect appropriate.     Data Reviewed: I have personally reviewed following labs and imaging studies  CBC: Recent Labs  Lab 09/19/21 2303  WBC 6.3  NEUTROABS 4.2  HGB 13.7   HCT 41.0  MCV 100.0  PLT 037   Basic Metabolic Panel: Recent Labs  Lab 09/19/21 2303  NA 142  K 4.0  CL 111  CO2 23  GLUCOSE 152*  BUN 22  CREATININE 1.15*  CALCIUM 9.4   GFR: Estimated Creatinine Clearance: 40.4 mL/min (A) (by C-G formula based on SCr of 1.15 mg/dL (H)). Liver Function Tests: Recent Labs  Lab 09/19/21 2303  AST 18  ALT 15  ALKPHOS 70  BILITOT 0.6  PROT 7.8  ALBUMIN 3.8   No results for input(s): "LIPASE", "AMYLASE" in the last 168 hours. No results for input(s): "AMMONIA" in the last 168 hours. Coagulation Profile: Recent Labs  Lab 09/19/21 2303  INR 1.0   Cardiac Enzymes: No results for input(s): "CKTOTAL", "CKMB", "CKMBINDEX", "TROPONINI" in the last 168 hours. BNP (last 3 results) No results for input(s): "PROBNP" in the last 8760 hours. HbA1C: No results for input(s): "HGBA1C" in the last 72 hours. CBG: Recent Labs  Lab 09/20/21 0248 09/20/21 0519  GLUCAP 132* 84   Lipid Profile: No results for input(s): "CHOL", "HDL", "LDLCALC", "TRIG", "CHOLHDL", "LDLDIRECT" in the last 72 hours. Thyroid Function Tests: No results for input(s): "TSH", "T4TOTAL", "FREET4", "T3FREE", "THYROIDAB" in the last 72 hours. Anemia Panel: No results for input(s): "VITAMINB12", "FOLATE", "FERRITIN", "TIBC", "IRON", "RETICCTPCT" in the last 72 hours. Urine analysis:    Component Value Date/Time   COLORURINE YELLOW (A) 09/20/2021 0200   APPEARANCEUR CLEAR (A) 09/20/2021 0200   LABSPEC 1.038 (H) 09/20/2021 0200   PHURINE 6.0 09/20/2021 0200   GLUCOSEU NEGATIVE 09/20/2021 0200   GLUCOSEU 100 (A) 09/23/2015 1258   HGBUR NEGATIVE 09/20/2021 0200   BILIRUBINUR NEGATIVE 09/20/2021 0200   KETONESUR NEGATIVE 09/20/2021 0200   PROTEINUR NEGATIVE 09/20/2021 0200   UROBILINOGEN 0.2 09/23/2015 1258   NITRITE NEGATIVE 09/20/2021 0200   LEUKOCYTESUR MODERATE (A) 09/20/2021 0200   Sepsis Labs: '@LABRCNTIP'$ (procalcitonin:4,lacticidven:4)  ) Recent Results (from  the past 240 hour(s))  Resp Panel by RT-PCR (Flu A&B, Covid) Anterior Nasal Swab     Status: None   Collection Time: 09/19/21 11:20 PM   Specimen: Anterior Nasal Swab  Result Value Ref Range Status   SARS Coronavirus 2 by RT PCR NEGATIVE NEGATIVE Final    Comment: (NOTE) SARS-CoV-2 target nucleic acids are NOT DETECTED.  The SARS-CoV-2 RNA is generally detectable in upper respiratory specimens during the acute phase of infection. The lowest concentration of SARS-CoV-2 viral copies this assay can detect is 138 copies/mL. A negative result does not preclude SARS-Cov-2 infection and should not be used as the sole basis for treatment or other patient management decisions. A negative result may occur with  improper specimen collection/handling, submission of specimen other than nasopharyngeal swab, presence of viral mutation(s) within the areas targeted by this assay, and inadequate number of viral copies(<138 copies/mL). A negative result must be combined with clinical observations, patient history, and epidemiological information. The expected result is Negative.  Fact Sheet for Patients:  EntrepreneurPulse.com.au  Fact Sheet for Healthcare Providers:  IncredibleEmployment.be  This test is no t yet approved or cleared  by the [place] and  has been authorized for detection and/or diagnosis of SARS-CoV-2 by FDA under an Emergency Use Authorization (EUA). This EUA will remain  in effect (meaning this test can be used) for the duration of the COVID-19 declaration under Section 564(b)(1) of the Act, 21 U.S.C.section 360bbb-3(b)(1), unless the authorization is terminated  or revoked sooner.       Influenza A by PCR NEGATIVE NEGATIVE Final   Influenza B by PCR NEGATIVE NEGATIVE Final    Comment: (NOTE) The Xpert Xpress SARS-CoV-2/FLU/RSV plus assay is intended as an aid in the diagnosis of influenza from Nasopharyngeal swab specimens  and should not be used as a sole basis for treatment. Nasal washings and aspirates are unacceptable for Xpert Xpress SARS-CoV-2/FLU/RSV testing.  Fact Sheet for Patients: EntrepreneurPulse.com.au  Fact Sheet for Healthcare Providers: IncredibleEmployment.be  This test is not yet approved or cleared by the [place] FDA and has been authorized for detection and/or diagnosis of SARS-CoV-2 by FDA under an Emergency Use Authorization (EUA). This EUA will remain in effect (meaning this test can be used) for the duration of the COVID-19 declaration under Section 564(b)(1) of the Act, 21 U.S.C. section 360bbb-3(b)(1), unless the authorization is terminated or revoked.  Performed at [facility], [address] [place], [place] [address]          Radiology Studies: MR BRAIN WO CONTRAST  Result Date: 09/20/2021 CLINICAL DATA:  Stroke-like symptoms EXAM: MRI HEAD WITHOUT CONTRAST TECHNIQUE: Multiplanar, multiecho pulse sequences of the brain and surrounding structures were obtained without intravenous contrast. COMPARISON:  10/17/2019 brain MRI Head CT 09/19/2021 FINDINGS: Brain: There is a small acute infarct of the left caudate nucleus. No acute or chronic hemorrhage. There is multifocal hyperintense T2-weighted signal within the white matter. Generalized volume loss. The midline structures are normal. Vascular: Major flow voids are preserved. Skull and upper cervical spine: Normal calvarium and skull base. Visualized upper cervical spine and soft tissues are normal. Sinuses/Orbits:No paranasal sinus fluid levels or advanced mucosal thickening. No mastoid or middle ear effusion. Normal orbits. IMPRESSION: 1. Small acute infarct of the left caudate nucleus. No hemorrhage or mass effect. 2. 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 Electronically Signed   By: Ulyses Jarred M.D.   On: 09/20/2021 01:58   CT ANGIO HEAD NECK W WO CM (CODE  STROKE)  Result Date: 09/19/2021 CLINICAL DATA:  Right Side weakness EXAM: CT ANGIOGRAPHY HEAD AND NECK TECHNIQUE: Multidetector CT imaging of the head and neck was performed using the standard protocol during bolus administration of intravenous contrast. Multiplanar CT image reconstructions and MIPs were obtained to evaluate the vascular anatomy. Carotid stenosis measurements (when applicable) are obtained utilizing NASCET criteria, using the distal internal carotid diameter as the denominator. RADIATION DOSE REDUCTION: This exam was performed according to the departmental dose-optimization program which includes automated exposure control, adjustment of the mA and/or kV according to patient size and/or use of iterative reconstruction technique. CONTRAST:  36m OMNIPAQUE IOHEXOL 350 MG/ML SOLN COMPARISON:  None Available. FINDINGS: CTA NECK FINDINGS SKELETON: There is no bony spinal canal stenosis. No lytic or blastic lesion. OTHER NECK: Normal pharynx, larynx and major salivary glands. No cervical lymphadenopathy. Unremarkable thyroid gland. UPPER CHEST: No pneumothorax or pleural effusion. No nodules or masses. AORTIC ARCH: There is no calcific atherosclerosis of the aortic arch. There is no aneurysm, dissection or hemodynamically significant stenosis of the visualized portion of the aorta. Conventional 3 vessel aortic branching pattern. The visualized proximal  subclavian arteries are widely patent. RIGHT CAROTID SYSTEM: Normal without aneurysm, dissection or stenosis. LEFT CAROTID SYSTEM: Normal without aneurysm, dissection or stenosis. VERTEBRAL ARTERIES: Left dominant configuration. Both origins are clearly patent. There is no dissection, occlusion or flow-limiting stenosis to the skull base (V1-V3 segments). CTA HEAD FINDINGS POSTERIOR CIRCULATION: --Vertebral arteries: Normal V4 segments. --Inferior cerebellar arteries: Normal. --Basilar artery: Normal. --Superior cerebellar arteries: Normal. --Posterior  cerebral arteries (PCA): There is severe stenosis of the right PCA P2 segment, but patency is maintained distally. There is occlusion of the mid to distal P2 segment of the left PCA. ANTERIOR CIRCULATION: --Intracranial internal carotid arteries: Normal. --Anterior cerebral arteries (ACA): Normal. Both A1 segments are present. Patent anterior communicating artery (a-comm). --Middle cerebral arteries (MCA): Normal. VENOUS SINUSES: As permitted by contrast timing, patent. ANATOMIC VARIANTS: None Review of the MIP images confirms the above findings. IMPRESSION: 1. Normal cervical and anterior intracranial circulation. 2. 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 3. 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 4. Aortic Atherosclerosis (ICD10-I70.0). Electronically Signed   By: Ulyses Jarred M.D.   On: 09/19/2021 23:45   CT HEAD CODE STROKE WO CONTRAST`  Result Date: 09/19/2021 CLINICAL DATA:  Code stroke.  Acute neurologic deficit EXAM: CT HEAD WITHOUT CONTRAST TECHNIQUE: Contiguous axial images were obtained from the base of the skull through the vertex without intravenous contrast. RADIATION DOSE REDUCTION: This exam was performed according to the departmental dose-optimization program which includes automated exposure control, adjustment of the mA and/or kV according to patient size and/or use of iterative reconstruction technique. COMPARISON:  None Available. FINDINGS: Brain: There is no mass, hemorrhage or extra-axial collection. There is generalized atrophy without lobar predilection. There is hypoattenuation of the periventricular white matter, most commonly indicating chronic ischemic microangiopathy. Vascular: No abnormal hyperdensity of the major intracranial arteries or dural venous sinuses. No intracranial atherosclerosis. Skull: The visualized skull base, calvarium and extracranial soft tissues are normal. Sinuses/Orbits: No fluid levels or advanced mucosal thickening of the visualized  paranasal sinuses. No mastoid or middle ear effusion. The orbits are normal. ASPECTS [facility] Stroke Program Early CT Score) - Ganglionic level infarction (caudate, lentiform nuclei, internal capsule, insula, M1-M3 cortex): 7 - Supraganglionic infarction (M4-M6 cortex): 3 Total score (0-10 with 10 being normal): 10 IMPRESSION: 1. No acute intracranial abnormality. 2. ASPECTS is 10. These results were called by telephone at the time of interpretation on 09/19/2021 at 11:16 pm to provider [facility] , who verbally acknowledged these results. Electronically Signed   By: Ulyses Jarred M.D.   On: 09/19/2021 23:16        Scheduled Meds:  [START ON 09/21/2021]  stroke: early stages of recovery book   Does not apply Once   aspirin EC  81 mg Oral QPM   citalopram  10 mg Oral Daily   [START ON 09/21/2021] clopidogrel  75 mg Oral Daily   enoxaparin (LOVENOX) injection  40 mg Subcutaneous Q24H   insulin aspart  0-15 Units Subcutaneous Q4H   levothyroxine  125 mcg Oral Q0600   pantoprazole  40 mg Oral QPM   Continuous Infusions:  sodium chloride 75 mL/hr at 09/20/21 0310     LOS: 0 days     Desma Maxim, MD [facility]   If 7PM-7AM, please contact night-coverage [email or web address] Password [facility] 09/20/2021, 9:21 AM

## 2021-09-20 NOTE — Consult Note (Signed)
Neurology Consultation Reason for Consult: Stroke Referring Physician: Lurena Joiner, Delane Ginger  CC: Stroke  History is obtained from: Patient  HPI: Joyce Maynard is a 81 y.o. female with a history of hypertension, hyperlipidemia who presented to the emergency department for slurred speech and right-sided weakness.  She first noticed symptoms around midnight of 9/15.  She presented to the emergency department the next day.  Her daughter initially called EMS around 7 AM, the patient initially refused transport.  Subsequently, she was finally convinced to come in to be checked out.  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 She has never used smokeless tobacco. She reports that she does not drink alcohol and does not use drugs.   Exam: Current vital signs: BP (!) 163/86 (BP Location: Left Arm)   Pulse 76   Temp 98.4 F (36.9 C)   Resp 16   Ht '5\' 6"'$  (1.676 m)   Wt 77.8 kg   SpO2 98%   BMI 27.68 kg/m  Vital signs in last 24 hours: Temp:  [97.7 F (36.5 C)-98.4 F (36.9 C)] 98.4 F (36.9 C) (09/16 0933) Pulse Rate:  [65-89] 76 (09/16 0933) Resp:  [12-21] 16 (09/16 0933) BP: (142-173)/(86-106) 163/86 (09/16 0933) SpO2:  [95 %-100 %] 98 % (09/16 0933) Weight:  [77.8 kg-78.3 kg] 77.8 kg (09/16 0229)   Physical Exam  Constitutional: Appears well-developed and well-nourished.   Neuro: Mental  Status: Patient is awake, alert, oriented to person, place, month, year, and situation. Patient is able to give a clear and coherent history. No signs of aphasia or neglect Cranial Nerves: II: Visual Fields are full. Pupils are equal, round, and reactive to light.   III,IV, VI: EOMI without ptosis or diploplia.  V: Facial sensation is symmetric to temperature VII: Facial movement with right-sided weakness VIII: hearing is intact to voice X: Uvula elevates symmetrically XI: Shoulder shrug is symmetric. XII: tongue is midline without atrophy or fasciculations.  Motor: Tone is normal. Bulk is normal. 5/5 strength was present on the left, 4/5 weakness of the right arm and leg Sensory: Sensation is symmetric to light touch and temperature in the arms and legs. Cerebellar: Significant difficulty with finger-nose-finger and heel-knee-shin on the right     I have reviewed labs in epic and the results pertinent to this consultation are: LDL 145  I have reviewed the images obtained: MRI brain-subcortical stroke on the left  Impression: 81 year old female with what is likely a small vessel stroke.  She has been on rosuvastatin the past, I am not sure why she is not taking it currently  Recommendations: 1) aspirin 81 mg and Plavix 75 mg daily for 3 weeks followed by Plavix monotherapy 2) Lipitor  80 mg nightly 3) echo is pending 4) can gradually begin to lower blood pressure, with ultimate goal being normotension over the next few weeks 5) DM control with goal A1c less than seven 6) I suspect she will need some time at rehab, but she is adamant that she is not going.  I suspect if she is discharged home instead of to a rehab care will need to be [place].  She is competent in my opinion, but not sure how well she will fly at home.  If PT and OT feel she can make it with home health, will defer to their assessment. 7) neurology will be available on an as-needed basis.  Roland Rack, MD [facility] [phone]  If 7pm- 7am, please page neurology on call as listed in [place].

## 2021-09-20 NOTE — TOC Initial Note (Addendum)
Transition of Care [facility]) - Initial/Assessment Note    Patient Details  Name: Joyce Maynard MRN: [id number] Date of Birth: September 25, 1940  Transition of Care [facility]) CM/SW Contact:    Harriet Masson, RN Phone Number:[phone] 09/20/2021, 3:14 PM  Clinical Narrative:                 RN TOC had a very long conversation with pt and daughter Joyce Maynard) concerning HHPT/OT/Speech vs SNF however pt reluctant to proceed with all recommendations suggested at this time and continues to decline SNF. TOC RN even had conversations on higher level of care for LTAC even APS if she felt the pt would be neglectful if family was not able to accomodates the pt's care. Daughter Joyce Maynard) will follow up with you tomorrow if she can convince the pt to accept [facility] services prior to discharge for order entry. Pt lives alone with her spouse and has some assistance from her children. No other issues mentioned at this time.  TOC will continue to follow up accordingly with any requested discharge needs.  1615 Addendum: Spoke with daughter Joyce Maynard who indicates pt accepted [facility] for PT/OT/Speech and repots pt already has a RW and BSC with rales for the shower. Attending aware to enter the appropriate orders for [facility] with plans for discharge on tomorrow. Services set up with [facility] who accepted services. No other request at this time.   Expected Discharge Plan: Home/Self Care Barriers to Discharge: Continued Medical Work up   Patient Goals and CMS Choice     Choice offered to / list presented to : Patient, Adult Children  Expected Discharge Plan and Services Expected Discharge Plan: Home/Self Care       Living arrangements for the past 2 months: Single Family Home                                      Prior Living Arrangements/Services Living arrangements for the past 2 months: Single Family Home Lives with:: Spouse Patient language and need for interpreter reviewed:: Yes Do you  feel safe going back to the place where you live?: Yes      Need for Family Participation in Patient Care: Yes (Comment) Care giver support system in place?: Yes (comment) (However limited)      Activities of Daily Living      Permission Sought/Granted                  Emotional Assessment Appearance:: Appears stated age Attitude/Demeanor/Rapport: Other (comment) (adamant with her declined for SNF) Affect (typically observed): Blunt Orientation: : Oriented to Situation, Oriented to  Time, Oriented to Place, Oriented to Self   Psych Involvement:  (neurology consult)  Admission diagnosis:  Acute CVA (cerebrovascular accident) [facility]) [I63.9] Cerebrovascular accident (CVA), unspecified mechanism ([place]) [I63.9] Patient Active Problem List   Diagnosis Date Noted   Acute CVA (cerebrovascular accident) ([place]) 09/20/2021   Vertebral compression fracture ([place]) 07/20/2021   Osteoporosis 07/20/2021   AKI (acute kidney injury) ([place]) 07/20/2021   Back pain 07/19/2021   Right groin pain 12/14/2019   History of medication noncompliance 11/25/2019   History of CVA (cerebrovascular accident) 10/18/2019   Stroke ([place]) 10/17/2019   Anxiety 10/17/2019   Nausea vomiting and diarrhea 10/17/2019   Hypokalemia 10/17/2019   Weakness    CKD (chronic kidney disease), stage IIIa 12/12/2018   Left breast lump 09/10/2015   Health care  maintenance 10/05/2014   Mild depression 04/16/2014   Other malaise and fatigue 09/20/2013   Leg pain 01/18/2013   Essential hypertension, benign 04/18/2012   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

## 2021-09-20 NOTE — Assessment & Plan Note (Signed)
Hold metoprolol for permissive hypertension

## 2021-09-20 NOTE — Assessment & Plan Note (Signed)
Continue levothyroxine 

## 2021-09-20 NOTE — Evaluation (Signed)
Physical Therapy Evaluation Patient Details Name: Joyce Maynard MRN: [id number] DOB: 06-05-1940 Today's Date: 09/20/2021  History of Present Illness  Joyce Maynard is a 81 y.o. 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 MRI brain showed small acute infarct of the left caudate nucleus.  Clinical Impression  Pt received in bed agreeable to participate in therapy evaluation. PT/OT performed Co-eval 2/2 to pt safety and mobility. Pt is A & O place and long term memory. Pt  lives with spouse who has alzheimer.  Today's PT assessment reveals pt is weak with R sided hemiparesis and decreased processing ability requiring mod to max assist to perform Bed mobility, Transfers and standing balance. Pt needs cues for sequencing to complete the task.  Pt became disoriented att he end of the activities which improved in 2 to 3 mins.  Pt is refusing SNF care due to bad experience during pandemic. PT communicated with Daughter who is unable to provide 24 hour care to pt at home due to new pt condition. Pt will benefit from SNF to improve functional  mobility and safety.      Recommendations for follow up therapy are one component of a multi-disciplinary discharge planning process, led by the attending physician.  Recommendations may be updated based on patient status, additional functional criteria and insurance authorization.  Follow Up Recommendations Skilled nursing-short term rehab (<3 hours/day)      Assistance Recommended at Discharge Frequent or constant Supervision/Assistance  Patient can return home with the following  A lot of help with bathing/dressing/bathroom;Assistance with cooking/housework;Assistance with feeding;Direct supervision/assist for medications  management;Direct supervision/assist for financial management;Help with stairs or ramp for entrance;Assist for transportation;Two people to help with walking and/or transfers    Equipment Recommendations Rolling walker (2 wheels)  Recommendations for Other Services       Functional Status Assessment Patient has had a recent decline in their functional status and demonstrates the ability to make significant improvements in function in a reasonable and predictable amount of time.     Precautions / Restrictions Precautions Precautions: Fall Restrictions Weight Bearing Restrictions: No      Mobility  Bed Mobility Overal bed mobility: Needs Assistance Bed Mobility: Rolling, Supine to Sit Rolling: Mod assist   Supine to sit: Max assist          Transfers Overall transfer level: Needs assistance Equipment used: Rolling walker (2 wheels) Transfers: Sit to/from Stand, Bed to chair/wheelchair/BSC Sit to Stand: Mod assist, +2 physical assistance   Step pivot transfers: Min assist, +2 physical assistance       General transfer comment: balance is impaired.    Ambulation/Gait: Unable                  Stairs: N/T            Wheelchair Mobility: N/T    Modified Rankin (Stroke Patients Only)       Balance Overall balance assessment: Needs assistance Sitting-balance support: Feet supported, Bilateral upper extremity supported Sitting balance-Leahy Scale: Fair   Postural control: Right lateral lean Standing balance support: Bilateral upper extremity supported Standing balance-Leahy Scale: Poor                               Pertinent Vitals/Pain Pain  Assessment Pain Assessment: No/denies pain    Home Living Family/patient expects to be discharged to:: Private residence Living Arrangements: Spouse/significant other Available Help at Discharge: Family; available intermittently and PRN Type of Home: House Home Access: Stairs to  enter Entrance Stairs-Rails: None Entrance Stairs-Number of Steps: single threshold step   Home Layout: One level Home Equipment: Rollator (4 wheels);Rolling Walker (2 wheels);Grab bars - tub/shower;Grab bars - toilet;Shower seat      Prior Function Prior Level of Function : Needs assist  Cognitive Assist : Mobility (cognitive) Mobility (Cognitive): Step by step cues   Physical Assist : Mobility (physical)     Mobility Comments: mod I with RW ADLs Comments: receives intermittent assist for bathing, primarily takes sponge baths.  Pt's children provide support for IADLs including cooking, cleaning, meds, and driving.     Hand Dominance   Dominant Hand: Right    Extremity/Trunk Assessment   Upper Extremity Assessment Upper Extremity Assessment: Generalized weakness;RUE deficits/detail (sensation intact, finger to nose grossly intact with slight undershooting of target) RUE Deficits / Details: R side weaker than L    Lower Extremity Assessment Lower Extremity Assessment: Generalized weakness RLE Deficits / Details: 3-/5 MMT RLE Sensation: WNL RLE Coordination: WNL       Communication   Communication: Other (comment) (dysarthria)  Cognition Arousal/Alertness: Awake/alert Behavior During Therapy: Anxious Overall Cognitive Status: Impaired/Different from baseline Area of Impairment: Memory, Safety/judgement, Problem solving, Orientation                 Orientation Level: Situation, Time   Memory: Decreased short-term memory   Safety/Judgement: Decreased awareness of safety, Decreased awareness of deficits   Problem Solving: Slow processing, Requires verbal cues, Difficulty sequencing, Decreased initiation General Comments: executive function is effected        General Comments      Exercises General Exercises - Lower Extremity Ankle Circles/Pumps: AROM, 10 reps, Seated Heel Slides: AROM, 10 reps, Seated   Assessment/Plan    PT Assessment Patient needs  continued PT services  PT Problem List Decreased strength;Decreased activity tolerance;Decreased balance;Decreased mobility;Decreased cognition;Decreased knowledge of use of DME;Decreased safety awareness;Decreased knowledge of precautions       PT Treatment Interventions Gait training;DME instruction;Functional mobility training;Therapeutic activities;Therapeutic exercise;Balance training;Neuromuscular re-education;Cognitive remediation;Patient/family education    PT Goals (Current goals can be found in the Care Plan section)  Acute Rehab PT Goals Patient Stated Goal: " I want to get better at my home. I don't want to go to SNF." PT Goal Formulation: With patient Time For Goal Achievement: 10/04/21 Potential to Achieve Goals: Fair    Frequency Min 2X/week     Co-evaluation PT/OT/SLP Co-Evaluation/Treatment: Yes Reason for Co-Treatment: Complexity of the patient's impairments (multi-system involvement);For patient/therapist safety;To address functional/ADL transfers PT goals addressed during session: Mobility/safety with mobility;Balance;Proper use of DME OT goals addressed during session: ADL's and self-care;Proper use of Adaptive equipment and DME       AM-PAC PT "6 Clicks" Mobility  Outcome Measure Help needed turning from your back to your side while in a flat bed without using bedrails?: A Little Help needed moving from lying on your back to sitting on the side of a flat bed without using bedrails?: A Lot Help needed moving to and from a bed to a chair (including a wheelchair)?: A Lot Help needed standing up from a chair using your arms (e.g., wheelchair or bedside chair)?: A Lot Help needed to walk in hospital room?: A Lot Help needed climbing 3-5 steps with a  railing? : Total 6 Click Score: 12    End of Session Equipment Utilized During Treatment: Gait belt Activity Tolerance: Patient tolerated treatment well;Treatment limited secondary to medical complications  (Comment) Patient left: in chair;with call bell/phone within reach;with chair alarm set Nurse Communication: Mobility status (COgnition) PT Visit Diagnosis: Unsteadiness on feet (R26.81);Other abnormalities of gait and mobility (R26.89);Muscle weakness (generalized) (M62.81);Difficulty in walking, not elsewhere classified (R26.2);Dizziness and giddiness (R42);Hemiplegia and hemiparesis Hemiplegia - Right/Left: Right Hemiplegia - dominant/non-dominant: Dominant 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

## 2021-09-20 NOTE — H&P (Signed)
History and Physical    Patient: Joyce Maynard NWG:[id number] DOB: Feb 17, 1940 DOA: 09/19/2021 DOS: the patient was seen and examined on 09/20/2021 PCP: Einar Pheasant, MD  Patient coming from: Home  Chief Complaint:  Chief Complaint  Patient presents with   Code Stroke    HPI: Joyce Maynard is a 81 y.o. 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  Symptoms started around midnight on 9/15 and did not present to the ED until several hours later when her daughter noted the slurring of her speech at 7 AM at which time she refused transport with EMS and she takes care of her elderly husband who has dementia.  She did eventually accept transport several hours later when she started developing worsening symptoms. ED course and data review: BP 162/92 with otherwise normal vitals and labs within normal limits except for creatinine 1.15 which is her baseline.  CT head and CTA head as part of code stroke protocol were both unremarkable. Neurology was consulted from the ED who recommended MRI brain and load with Plavix if her stroke is seen in further work-up as will be outlined in assessment and plan.  Patient received 324 mg aspirin and hospitalist consulted for admission.     Past Medical History:  Diagnosis Date   Breast cancer ([place]) 2005   s/p chemotherapy and XRT   Depression    Diabetes ([place])    GERD (gastroesophageal reflux disease)    Hx of vertigo    Hypercholesterolemia    Hypertension    Hypothyroidism    [place] 1948   history   PONV (postoperative nausea and vomiting)    Past Surgical History:  Procedure Laterality Date   BREAST BIOPSY Left 2005   BREAST BIOPSY Left 10/25/2015   US guided biopsy   BREAST LUMPECTOMY Left 2005    EXCISION OF BREAST LESION Left 11/19/2015   Procedure: EXCISION OF BREAST LESION/ MASS;  Surgeon: Leonie Green, MD;  Location: [facility] ORS;  Service: General;  Laterality: Left;   EYE SURGERY     cataracts bilateral   TUBAL LIGATION     Social History:  reports that she has never smoked. She has never used smokeless tobacco. She reports that she does not drink alcohol and does not use drugs.  No Known Allergies  Family History  Problem Relation Age of Onset   Cancer Mother        breast   Breast cancer Mother 87   Stroke Father    Breast cancer Maternal Aunt     Prior to Admission medications   Medication Sig Start Date End Date Taking? Authorizing Provider  acetaminophen (TYLENOL) 500 MG tablet Take 2 tablets (1,000 mg total) by mouth every 8 (eight) hours. 07/21/21   Ezekiel Slocumb, DO  aspirin EC 81 MG tablet Take 81 mg by mouth every evening.    [provider]  citalopram (CELEXA) 10 MG tablet Take 1 tablet by mouth once daily 08/09/21   Dutch Quint B, FNP  Continuous Blood Gluc Sensor (FREESTYLE LIBRE 3 SENSOR) MISC Place 1 sensor on the skin every 14 days. Use to check glucose continuously 09/09/21   Leone Haven, MD  docusate sodium (COLACE) 100 MG capsule Take 1 capsule (100 mg total) by mouth 2 (two) times daily. 07/21/21  Nicole Kindred A, DO  levothyroxine (SYNTHROID) 125 MCG tablet Take 1 tablet by mouth once daily 07/27/21   Dutch Quint B, FNP  lidocaine (LMX) 4 % cream Apply topically 3 (three) times daily as needed (back pain). 07/21/21   Ezekiel Slocumb, DO  metFORMIN (GLUCOPHAGE-XR) 500 MG 24 hr tablet Take 500 mg by mouth daily with supper.    [provider]  metoprolol succinate (TOPROL-XL) 25 MG 24 hr tablet Take 1 tablet by mouth once daily Patient taking differently: Take 25 mg by mouth every evening. 07/17/21   Einar Pheasant, MD  pantoprazole (PROTONIX) 40 MG tablet TAKE 1 TABLET BY MOUTH TWICE DAILY WITH A MEAL Patient taking  differently: Take 40 mg by mouth every evening. 07/17/21   Einar Pheasant, MD  traMADol (ULTRAM) 50 MG tablet Take 1 tablet (50 mg total) by mouth every 6 (six) hours as needed for moderate pain or severe pain (despite tylenol). 07/21/21   Ezekiel Slocumb, DO  venlafaxine XR (EFFEXOR-XR) 150 MG 24 hr capsule Take 1 capsule by mouth once daily Patient taking differently: 150 mg daily. 07/27/21   Kennyth Arnold, FNP    Physical Exam: Vitals:   09/19/21 2330 09/20/21 0000 09/20/21 0015 09/20/21 0030  BP: (!) 158/92 (!) 162/92 (!) 160/99 (!) 142/99  Pulse: 85 85 84 84  Resp: (!) '21 12 19 17  '$ SpO2: 97% 99% 99% 97%  Weight:      Height:       Physical Exam Vitals and nursing note reviewed.  Constitutional:      General: She is not in acute distress. HENT:     Head: Normocephalic and atraumatic.  Cardiovascular:     Rate and Rhythm: Normal rate and regular rhythm.     Heart sounds: Normal heart sounds.  Pulmonary:     Effort: Pulmonary effort is normal.     Breath sounds: Normal breath sounds.  Abdominal:     Palpations: Abdomen is soft.     Tenderness: There is no abdominal tenderness.  Neurological:     Mental Status: She is alert and oriented to person, place, and time.     Cranial Nerves: Dysarthria and facial asymmetry present.     Motor: Weakness present.     Labs on Admission: I have personally reviewed following labs and imaging studies  CBC: Recent Labs  Lab 09/19/21 2303  WBC 6.3  NEUTROABS 4.2  HGB 13.7  HCT 41.0  MCV 100.0  PLT 921   Basic Metabolic Panel: Recent Labs  Lab 09/19/21 2303  NA 142  K 4.0  CL 111  CO2 23  GLUCOSE 152*  BUN 22  CREATININE 1.15*  CALCIUM 9.4   GFR: Estimated Creatinine Clearance: 41.4 mL/min (A) (by C-G formula based on SCr of 1.15 mg/dL (H)). Liver Function Tests: Recent Labs  Lab 09/19/21 2303  AST 18  ALT 15  ALKPHOS 70  BILITOT 0.6  PROT 7.8  ALBUMIN 3.8   No results for input(s): "LIPASE", "AMYLASE" in  the last 168 hours. No results for input(s): "AMMONIA" in the last 168 hours. Coagulation Profile: Recent Labs  Lab 09/19/21 2303  INR 1.0   Cardiac Enzymes: No results for input(s): "CKTOTAL", "CKMB", "CKMBINDEX", "TROPONINI" in the last 168 hours. BNP (last 3 results) No results for input(s): "PROBNP" in the last 8760 hours. HbA1C: No results for input(s): "HGBA1C" in the last 72 hours. CBG: No results for input(s): "GLUCAP" in the last 168 hours. Lipid Profile: No  results for input(s): "CHOL", "HDL", "LDLCALC", "TRIG", "CHOLHDL", "LDLDIRECT" in the last 72 hours. Thyroid Function Tests: No results for input(s): "TSH", "T4TOTAL", "FREET4", "T3FREE", "THYROIDAB" in the last 72 hours. Anemia Panel: No results for input(s): "VITAMINB12", "FOLATE", "FERRITIN", "TIBC", "IRON", "RETICCTPCT" in the last 72 hours. Urine analysis:    Component Value Date/Time   COLORURINE YELLOW (A) 07/19/2021 0657   APPEARANCEUR HAZY (A) 07/19/2021 0657   LABSPEC 1.023 07/19/2021 0657   PHURINE 5.0 07/19/2021 0657   GLUCOSEU NEGATIVE 07/19/2021 0657   GLUCOSEU 100 (A) 09/23/2015 1258   HGBUR NEGATIVE 07/19/2021 0657   BILIRUBINUR NEGATIVE 07/19/2021 0657   KETONESUR NEGATIVE 07/19/2021 0657   PROTEINUR NEGATIVE 07/19/2021 0657   UROBILINOGEN 0.2 09/23/2015 1258   NITRITE NEGATIVE 07/19/2021 0657   LEUKOCYTESUR LARGE (A) 07/19/2021 0657    Radiological Exams on Admission: CT ANGIO HEAD NECK W WO CM (CODE STROKE)  Result Date: 09/19/2021 CLINICAL DATA:  Right Side weakness EXAM: CT ANGIOGRAPHY HEAD AND NECK TECHNIQUE: Multidetector CT imaging of the head and neck was performed using the standard protocol during bolus administration of intravenous contrast. Multiplanar CT image reconstructions and MIPs were obtained to evaluate the vascular anatomy. Carotid stenosis measurements (when applicable) are obtained utilizing NASCET criteria, using the distal internal carotid diameter as the denominator.  RADIATION DOSE REDUCTION: This exam was performed according to the departmental dose-optimization program which includes automated exposure control, adjustment of the mA and/or kV according to patient size and/or use of iterative reconstruction technique. CONTRAST:  65m OMNIPAQUE IOHEXOL 350 MG/ML SOLN COMPARISON:  None Available. FINDINGS: CTA NECK FINDINGS SKELETON: There is no bony spinal canal stenosis. No lytic or blastic lesion. OTHER NECK: Normal pharynx, larynx and major salivary glands. No cervical lymphadenopathy. Unremarkable thyroid gland. UPPER CHEST: No pneumothorax or pleural effusion. No nodules or masses. AORTIC ARCH: There is no calcific atherosclerosis of the aortic arch. There is no aneurysm, dissection or hemodynamically significant stenosis of the visualized portion of the aorta. Conventional 3 vessel aortic branching pattern. The visualized proximal subclavian arteries are widely patent. RIGHT CAROTID SYSTEM: Normal without aneurysm, dissection or stenosis. LEFT CAROTID SYSTEM: Normal without aneurysm, dissection or stenosis. VERTEBRAL ARTERIES: Left dominant configuration. Both origins are clearly patent. There is no dissection, occlusion or flow-limiting stenosis to the skull base (V1-V3 segments). CTA HEAD FINDINGS POSTERIOR CIRCULATION: --Vertebral arteries: Normal V4 segments. --Inferior cerebellar arteries: Normal. --Basilar artery: Normal. --Superior cerebellar arteries: Normal. --Posterior cerebral arteries (PCA): There is severe stenosis of the right PCA P2 segment, but patency is maintained distally. There is occlusion of the mid to distal P2 segment of the left PCA. ANTERIOR CIRCULATION: --Intracranial internal carotid arteries: Normal. --Anterior cerebral arteries (ACA): Normal. Both A1 segments are present. Patent anterior communicating artery (a-comm). --Middle cerebral arteries (MCA): Normal. VENOUS SINUSES: As permitted by contrast timing, patent. ANATOMIC VARIANTS: None  Review of the MIP images confirms the above findings. IMPRESSION: 1. Normal cervical and anterior intracranial circulation. 2. 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 3. 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 4. Aortic Atherosclerosis (ICD10-I70.0). Electronically Signed   By: KUlyses JarredM.D.   On: 09/19/2021 23:45   CT HEAD CODE STROKE WO CONTRAST`  Result Date: 09/19/2021 CLINICAL DATA:  Code stroke.  Acute neurologic deficit EXAM: CT HEAD WITHOUT CONTRAST TECHNIQUE: Contiguous axial images were obtained from the base of the skull through the vertex without intravenous contrast. RADIATION DOSE REDUCTION: This exam was performed according to the  departmental dose-optimization program which includes automated exposure control, adjustment of the mA and/or kV according to patient size and/or use of iterative reconstruction technique. COMPARISON:  None Available. FINDINGS: Brain: There is no mass, hemorrhage or extra-axial collection. There is generalized atrophy without lobar predilection. There is hypoattenuation of the periventricular white matter, most commonly indicating chronic ischemic microangiopathy. Vascular: No abnormal hyperdensity of the major intracranial arteries or dural venous sinuses. No intracranial atherosclerosis. Skull: The visualized skull base, calvarium and extracranial soft tissues are normal. Sinuses/Orbits: No fluid levels or advanced mucosal thickening of the visualized paranasal sinuses. No mastoid or middle ear effusion. The orbits are normal. ASPECTS [facility] Stroke Program Early CT Score) - Ganglionic level infarction (caudate, lentiform nuclei, internal capsule, insula, M1-M3 cortex): 7 - Supraganglionic infarction (M4-M6 cortex): 3 Total score (0-10 with 10 being normal): 10 IMPRESSION: 1. No acute intracranial abnormality. 2. ASPECTS is 10. These results were called by telephone at the time of interpretation on 09/19/2021 at 11:16 pm to provider [facility] , who verbally acknowledged these results. Electronically Signed   By: Ulyses Jarred M.D.   On: 09/19/2021 23:16     Data Reviewed: Relevant notes from primary care and specialist visits, past discharge summaries as available in EHR, including Care Everywhere. Prior diagnostic testing as pertinent to current admission diagnoses Updated medications and problem lists for reconciliation ED course, including vitals, labs, imaging, treatment and response to treatment Triage notes, nursing and pharmacy notes and ED provider's notes Notable results as noted in HPI   Assessment and Plan: * Acute CVA (cerebrovascular accident) ([place]) History of thalamic stroke 2021 on aspirin 81 mg Recommendations of teleneurology as follows:   Stroke/Telemetry Floor       Neuro Checks       Bedside Swallow Eval       DVT Prophylaxis       Euglycemia and Avoid Hyperthermia (PRN Acetaminophen)       Systolic BP OYD<741       Aspirin 325 mg once now, then 81 mg daily.       Atorvastatin 40 mg daily.       Brain MRI without contrast       If a small acute stroke is seen on MRI brain, without hemorrhage, also give 300 mg clopidogrel one time, followed by 90 days of clopidogrel 75 mg daily.         If an acute or subacute embolic-appearing stroke (i.e. not lacunar) is seen on MRI brain, obtain TTE in hospital and Holter monitor on discharge. If no acute stroke is seen on MRI brain, or lacunar stroke is seen, these tests are not needed.       HbA1C, Lipid Panel, TSH  Addendum: MRI showing acute stroke  So patient loaded with plavix Neurology consult to follow  CKD (chronic kidney disease), stage IIIa Renal function at baseline  Hypothyroidism Continue levothyroxine  Type II diabetes mellitus with renal manifestations (HCC) Sliding scale insulin coverage  Essential hypertension, benign Hold metoprolol for permissive hypertension        DVT prophylaxis: Lovenox  Consults: neurology  Advance  Care Planning:   Code Status: Prior   Family Communication: son at bedside  Disposition Plan: Back to previous home environment  Severity of Illness: The appropriate patient status for this patient is INPATIENT. Inpatient status is judged to be reasonable and necessary in order to provide the required intensity of service to ensure the patient's safety. The patient's presenting symptoms, physical exam  findings, and initial radiographic and laboratory data in the context of their chronic comorbidities is felt to place them at high risk for further clinical deterioration. Furthermore, it is not anticipated that the patient will be medically stable for discharge from the hospital within 2 midnights of admission.   * I certify that at the point of admission it is my clinical judgment that the patient will require inpatient hospital care spanning beyond 2 midnights from the point of admission due to high intensity of service, high risk for further deterioration and high frequency of surveillance required.*  Author: Athena Masse, MD 09/20/2021 12:48 AM  For on call review [email or web address].

## 2021-09-20 NOTE — ED Notes (Signed)
Patient returned from MRI.

## 2021-09-20 NOTE — ED Notes (Signed)
Patient taken to MRI at this time 

## 2021-09-20 NOTE — Assessment & Plan Note (Addendum)
History of thalamic stroke 2021 on aspirin 81 mg Recommendations of teleneurology as follows: Marland Kitchen  Stroke/Telemetry Floor     .  Neuro Checks     .  Bedside Swallow Eval     .  DVT Prophylaxis     .  Euglycemia and Avoid Hyperthermia (PRN Acetaminophen)     .  Systolic BP EXB<284     .  Aspirin 325 mg once now, then 81 mg daily.     .  Atorvastatin 40 mg daily.     .  Brain MRI without contrast     .  If a small acute stroke is seen on MRI brain, without hemorrhage, also give 300 mg clopidogrel one time, followed by 90 days of clopidogrel 75 mg daily.      .  If an acute or subacute embolic-appearing stroke (i.e. not lacunar) is seen on MRI brain, obtain TTE in hospital and Holter monitor on discharge. If no acute stroke is seen on MRI brain, or lacunar stroke is seen, these tests are not needed.     .  HbA1C, Lipid Panel, TSH  Addendum: MRI showing acute stroke Loaded with plavix '300mg'$  per above recommendations Neurology consult to follow

## 2021-09-20 NOTE — Assessment & Plan Note (Signed)
Renal function at baseline 

## 2021-09-20 NOTE — Evaluation (Addendum)
Occupational Therapy Evaluation Patient Details Name: Joyce Maynard MRN: [id number] DOB: 03-06-1940 Today's Date: 09/20/2021   History of Present Illness Joyce Maynard is a 81 y.o. 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 MRI brain showed small acute infarct of the left caudate nucleus.   Clinical Impression   Patient seen for OT evaluation. Patient presenting with R sided weakness, decreased cognition, decreased endurance, and impaired balance impacting safety and independence in ADLs. At baseline, pt is independent with most ADLs, receives assistance from children for IADLs, and is Mod I for functional mobility using a RW. Patient currently functioning at Max A for LB dressing, Min-Mod A +2 for functional transfers/mobility, and Max A +1 for bed mobility. Pt was oriented to self and place. She was able to follow all commands well, however, required step by step verbal/tactile cues for sequencing movements. Patient will benefit from acute OT to increase overall independence in the areas of ADLs and functional mobility in order to safely discharge to next venue of care. Upon hospital discharge, recommend STR to maximize pt safety and return to PLOF.         Recommendations for follow up therapy are one component of a multi-disciplinary discharge planning process, led by the attending physician.  Recommendations may be updated based on patient status, additional functional criteria and insurance authorization.   Follow Up Recommendations  Skilled nursing-short term rehab (<3 hours/day)    Assistance Recommended at Discharge Frequent or constant Supervision/Assistance  Patient can return home with the following A lot of help with walking and/or  transfers;A lot of help with bathing/dressing/bathroom;Assistance with cooking/housework;Direct supervision/assist for financial management;Assist for transportation;Help with stairs or ramp for entrance;Direct supervision/assist for medications management    Functional Status Assessment  Patient has had a recent decline in their functional status and demonstrates the ability to make significant improvements in function in a reasonable and predictable amount of time.  Equipment Recommendations  Other (comment) (defer to next venue of care)    Recommendations for Other Services       Precautions / Restrictions Precautions Precautions: Fall Restrictions Weight Bearing Restrictions: No      Mobility Bed Mobility Overal bed mobility: Needs Assistance Bed Mobility: Supine to Sit     Supine to sit: Max assist (+1)          Transfers Overall transfer level: Needs assistance   Transfers: Bed to chair/wheelchair/BSC, Sit to/from Stand Sit to Stand: Mod assist, +2 physical assistance     Step pivot transfers: Min assist, +2 physical assistance            Balance Overall balance assessment: Needs assistance Sitting-balance support: Feet supported, Bilateral upper extremity supported Sitting balance-Leahy Scale: Fair     Standing balance support: Bilateral upper extremity supported, Reliant on assistive device for balance Standing balance-Leahy Scale: Poor          General comments: Pt reporting dizziness after t/f to recliner, BP in sitting: 156/75 (MAP 99), SpO2 99% on RA. No headache reported, dizziness improved with time.                   ADL either performed or assessed with clinical judgement   ADL Overall ADL's : Needs assistance/impaired  Lower Body Dressing: Maximal assistance;Bed level Lower Body Dressing Details (indicate cue type and reason): Max A to don socks Toilet Transfer: +2 for physical assistance;Rolling  walker (2 wheels);Cueing for safety;Cueing for sequencing;Moderate assistance Toilet Transfer Details (indicate cue type and reason): simulated with stand pivot t/f from EOB > recliner Toileting- Clothing Manipulation and Hygiene: Maximal assistance;Sit to/from stand       Functional mobility during ADLs: +2 for physical assistance;Rolling walker (2 wheels);Cueing for safety;Cueing for sequencing;Minimal assistance (for 2-3 lateral steps toward recliner)       Vision Baseline Vision/History: 1 Wears glasses Patient Visual Report: No change from baseline Vision Assessment?: Yes Eye Alignment: Within Functional Limits Ocular Range of Motion: Within Functional Limits Alignment/Gaze Preference: Within Defined Limits Tracking/Visual Pursuits: Able to track stimulus in all quads without difficulty Visual Fields: No apparent deficits     Perception Perception Perception: Within Functional Limits   Praxis Praxis Praxis: Impaired Praxis Impairment Details: Motor planning    Pertinent Vitals/Pain Pain Assessment Pain Assessment: No/denies pain     Hand Dominance Right   Extremity/Trunk Assessment Upper Extremity Assessment Upper Extremity Assessment: Generalized weakness;RUE deficits/detail (sensation intact, finger to nose grossly intact with slight undershooting of target) RUE Deficits / Details: R side weaker than L   Lower Extremity Assessment Lower Extremity Assessment: Generalized weakness RLE Deficits / Details: 3-/5 MMT RLE Sensation: WNL RLE Coordination: WNL       Communication Communication Communication: Other (comment) (dysarthria)   Cognition Arousal/Alertness: Awake/alert Behavior During Therapy: Anxious Overall Cognitive Status: Impaired/Different from baseline Area of Impairment: Orientation, Attention, Memory, Following commands, Safety/judgement, Awareness, Problem solving                 Orientation Level: Disoriented to, Time,  Situation Current Attention Level: Focused Memory: Decreased recall of precautions, Decreased short-term memory Following Commands: Follows one step commands consistently Safety/Judgement: Decreased awareness of safety, Decreased awareness of deficits Awareness: Intellectual Problem Solving: Slow processing, Difficulty sequencing, Requires verbal cues, Requires tactile cues, Decreased initiation General Comments: Oriented to self, place ("hospital"); disoriented to time - trying to look at board in room. STM deficits noted. Able to follow all commands well. Pt reported not wanting to go to STR because she was there in 2020 and got "depressed". Slow processing time, step by step verbal/tactile cues for sequencing movements.     General Comments       Exercises Other Exercises Other Exercises: OT provided education re: role of OT, OT POC, post acute recs, sitting up for all meals, EOB/OOB mobility with assistance, home/fall safety.     Shoulder Instructions      Home Living Family/patient expects to be discharged to:: Private residence Living Arrangements: Spouse/significant other Available Help at Discharge: Family;Available PRN/intermittently Type of Home: House Home Access: Stairs to enter [employer] of Steps: single threshold step Entrance Stairs-Rails: None Home Layout: One level     Bathroom Shower/Tub: Teacher, early years/pre: Standard     Home Equipment: Rollator (4 wheels);Rolling Walker (2 wheels);Grab bars - tub/shower;Grab bars - toilet;Shower seat      Lives With: Spouse (has Alzheimer's)    Prior Functioning/Environment Prior Level of Function : Needs assist  Cognitive Assist : Mobility (cognitive) Mobility (Cognitive): Step by step cues   Physical Assist : Mobility (physical)     Mobility Comments: mod I with RW ADLs Comments: receives intermittent assist for bathing, primarily takes sponge baths.  Pt's children provide support for  IADLs including cooking, cleaning, meds,  and driving.        OT Problem List: Decreased strength;Decreased knowledge of use of DME or AE;Decreased activity tolerance;Decreased cognition;Decreased safety awareness;Impaired balance (sitting and/or standing);Decreased knowledge of precautions      OT Treatment/Interventions: Self-care/ADL training;Visual/perceptual remediation/compensation;Therapeutic exercise;Patient/family education;Balance training;Energy conservation;Therapeutic activities;Cognitive remediation/compensation;DME and/or AE instruction    OT Goals(Current goals can be found in the care plan section) Acute Rehab OT Goals Patient Stated Goal: to go home OT Goal Formulation: With patient Time For Goal Achievement: 10/04/21 Potential to Achieve Goals: Fair ADL Goals Pt Will Perform Grooming: standing;with modified independence Pt Will Perform Upper Body Dressing: sitting;with modified independence Pt Will Perform Lower Body Dressing: with modified independence;sitting/lateral leans;sit to/from stand;with adaptive equipment Pt Will Transfer to Toilet: with modified independence;stand pivot transfer;bedside commode Pt Will Perform Toileting - Clothing Manipulation and hygiene: with modified independence;sit to/from stand;with adaptive equipment  OT Frequency: Min 2X/week    Co-evaluation PT/OT/SLP Co-Evaluation/Treatment: Yes Reason for Co-Treatment: Complexity of the patient's impairments (multi-system involvement);For patient/therapist safety;To address functional/ADL transfers PT goals addressed during session: Mobility/safety with mobility;Balance;Proper use of DME OT goals addressed during session: ADL's and self-care;Proper use of Adaptive equipment and DME      AM-PAC OT "6 Clicks" Daily Activity     Outcome Measure Help from another person eating meals?: A Little Help from another person taking care of personal grooming?: A Little Help from another person toileting,  which includes using toliet, bedpan, or urinal?: A Lot Help from another person bathing (including washing, rinsing, drying)?: A Lot Help from another person to put on and taking off regular upper body clothing?: A Little Help from another person to put on and taking off regular lower body clothing?: A Lot 6 Click Score: 15   End of Session Equipment Utilized During Treatment: Gait belt;Rolling walker (2 wheels) Nurse Communication: Mobility status  Activity Tolerance: Patient limited by fatigue Patient left: in chair;with call bell/phone within reach;with chair alarm set  OT Visit Diagnosis: Other abnormalities of gait and mobility (R26.89);Muscle weakness (generalized) (M62.81);Cognitive communication deficit (R41.841);Feeding difficulties (R63.3) Symptoms and signs involving cognitive functions:  (small acute infarct of the left caudate nucleus)                Time: 1610-9604 OT Time Calculation (min): 27 min Charges:  OT General Charges $OT Visit: 1 Visit OT Evaluation $OT Eval Moderate Complexity: 1 Mod  [facility] MS, OTR/L ascom [phone]  09/20/21, 2:33 PM

## 2021-09-20 NOTE — Evaluation (Signed)
Speech Language Pathology Evaluation Patient Details Name: Joyce Maynard MRN: [id number] DOB: 1940/03/15 Today's Date: 09/20/2021 Time: 0815-0900 SLP Time Calculation (min) (ACUTE ONLY): 45 min  Problem List:  Patient Active Problem List   Diagnosis Date Noted   Acute CVA (cerebrovascular accident) ([place]) 09/20/2021   Vertebral compression fracture ([place]) 07/20/2021   Osteoporosis 07/20/2021   AKI (acute kidney injury) ([place]) 07/20/2021   Back pain 07/19/2021   Right groin pain 12/14/2019   History of medication noncompliance 11/25/2019   History of CVA (cerebrovascular accident) 10/18/2019   Stroke ([place]) 10/17/2019   Anxiety 10/17/2019   Nausea vomiting and diarrhea 10/17/2019   Hypokalemia 10/17/2019   Weakness    CKD (chronic kidney disease), stage IIIa 12/12/2018   Left breast lump 09/10/2015   Health care maintenance 10/05/2014   Mild depression 04/16/2014   Other malaise and fatigue 09/20/2013   Leg pain 01/18/2013   Essential hypertension, benign 04/18/2012   Type II diabetes mellitus with renal manifestations ([place]) 04/18/2012   Hypercholesterolemia 04/18/2012   History of breast cancer 04/18/2012   Hypothyroidism 04/18/2012   GERD (gastroesophageal reflux disease) 04/18/2012   Past Medical History:  Past Medical History:  Diagnosis Date   Breast cancer ([place]) 2005   s/p chemotherapy and XRT   Depression    Diabetes ([place])    GERD (gastroesophageal reflux disease)    Hx of vertigo    Hypercholesterolemia    Hypertension    Hypothyroidism    [place] 1948   history   PONV (postoperative nausea and vomiting)    Past Surgical History:  Past Surgical History:  Procedure Laterality Date   BREAST BIOPSY Left 2005   BREAST BIOPSY Left 10/25/2015   US guided biopsy   BREAST LUMPECTOMY Left 2005   EXCISION OF BREAST LESION Left 11/19/2015   Procedure: EXCISION OF BREAST LESION/ MASS;  Surgeon: Leonie Green, MD;  Location: [facility] ORS;  Service: General;   Laterality: Left;   EYE SURGERY     cataracts bilateral   TUBAL LIGATION     HPI:  Pt is a 81 y.o. 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  Symptoms started around midnight on 9/15 and did not present to the ED until several hours later when her daughter noted the slurring of her speech at 7 AM at which time she refused transport with EMS and she takes care of her elderly husband who has dementia.  She did eventually accept transport several hours later when she started developing worsening symptoms.  She has not followed up w/ her PCP in recent years per chart notes.  MRI: Small acute infarct of the left caudate nucleus. No hemorrhage or  mass effect.  2. 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   Assessment / Plan / Recommendation Clinical Impression   Pt seen this morning for assessment of speech-language abilities. Pt is awake, verbal. Communicated and engaged well w/ this Clinician even w/ some humor. Dysarthria+. Pt reports her speech "is still slurred".   She appears to present w/ Dysarthria impacting her verbal communication at the word-conversation levels. When pt utilized strategies of slowing rate of speech, emphasizing speech sounds - over-articulation, and supporting words w/ full breath support (getting Louder), pt's Dysarthria lessened and articulation of speech improved. Noted pt tended to speak as  breath support waned.  Education and practice of articulation strategies was completed w/ pt -- focused on over-articulation, enunciation of speech sounds, w/ Big, Loud speech; slowing rate of speech. Identified need to replenish breath support every 3-4 words to complete the sentence/task w/ good articulation and clarity.  Intelligibility greatly improved w/ her effort and use of strategies.  OM exam revealed Right lingual and labial oral motor weakness during tasks of lingual protrusion(deviation) and lateral reach. Mildly reduced lingual protrusion strength. Decreased Right labial tone and ROM noted. Suspect CNs 7 and 12 involvement. Noted improved oral motor strength/ROM w/ increased effort.   No apparent expressive or receptive aphasia noted, no overt cognitive deficits noted. A/O x4 and followed all instructions appropriately. Good conversation engagement. Stated she just wants to go home soon. ST services will f/u while admitted for ongoing instruction and education.  Education, practice, and Handouts provided in room. Recommend pt f/u w/ Outpatient skilled ST servcies to continue working on the Dysarthria through exercises and strategies to improve verbal communication in ADLs. NSG/MD updated. Pt agreed.    SLP Assessment  SLP Recommendation/Assessment: Patient needs continued Speech [place] Pathology Services (and at D/C) SLP Visit Diagnosis: Dysarthria and anarthria (R47.1)    Recommendations for follow up therapy are one component of a multi-disciplinary discharge planning process, led by the attending physician.  Recommendations may be updated based on patient status, additional functional criteria and insurance authorization.    Follow Up Recommendations   (TBD)    Assistance Recommended at Discharge  Set up Supervision/Assistance (w/ OM exs)  Functional Status Assessment Patient has had a recent decline in their functional status and demonstrates the ability to make significant improvements in function in a reasonable and predictable amount of time.  Frequency and Duration min 2x/week  1 week      SLP Evaluation Cognition  Overall Cognitive Status: Within Functional Limits for tasks assessed Arousal/Alertness: Awake/alert Orientation Level: Oriented X4 Year: 2023 Month: September Day of  Week: Correct Attention: Focused;Sustained Focused Attention: Appears intact Sustained Attention: Appears intact Memory: Appears intact Awareness: Appears intact Problem Solving: Appears intact Executive Function: Reasoning;Decision Making Reasoning: Appears intact Decision Making: Appears intact Behaviors:  (none) Safety/Judgment: Appears intact       Comprehension  Auditory Comprehension Overall Auditory Comprehension: Appears within functional limits for tasks assessed Yes/No Questions: Within Functional Limits Commands: Within Functional Limits Conversation: Complex Interfering Components:  (none) Visual Recognition/Discrimination Discrimination: Not tested Reading Comprehension Reading Status: Within funtional limits    Expression Expression Primary Mode of Expression: Verbal Verbal Expression Overall Verbal Expression: Appears within functional limits for tasks assessed Initiation: No impairment Automatic Speech:  (WFL) Level of Generative/Spontaneous Verbalization: Conversation Repetition: No impairment Naming: No impairment Pragmatics: No impairment Interfering Components:  (none) Non-Verbal Means of Communication: Not applicable Other Verbal Expression Comments: affected by Dysarthria Written Expression Dominant Hand: Right Written Expression: Not tested   Oral / Motor  Oral Motor/Sensory Function Overall Oral Motor/Sensory Function: Moderate impairment Facial ROM: Reduced right;Suspected CN VII (facial) dysfunction Facial Symmetry: Abnormal symmetry right;Suspected CN VII (facial) dysfunction Facial Strength: Reduced right;Suspected CN VII (facial) dysfunction Lingual ROM: Reduced right;Suspected CN XII (hypoglossal) dysfunction Lingual Symmetry: Abnormal symmetry right Lingual Strength: Reduced;Suspected CN XII (hypoglossal) dysfunction Velum: Within Functional Limits Mandible: Within Functional Limits Motor Speech Overall Motor Speech:  Impaired Respiration: Impaired (for breath support of speech) Level of Impairment: Sentence Phonation: Normal (min gravely) Resonance: Within functional limits Articulation: Impaired Level of Impairment: Word Intelligibility: Intelligibility reduced Word: 50-74%  accurate Phrase: 50-74% accurate Sentence: 50-74% accurate Conversation: 50-74% accurate Motor Speech Errors: Not applicable Interfering Components: Inadequate dentition (missing few but most anterior present) Effective Techniques: Slow rate;Increased vocal intensity;Over-articulate;Pause              Orinda Kenner, [place], CCC-SLP Speech Language Pathologist Rehab Services; [place] [phone] (ascom) Yissel Habermehl 09/20/2021, 12:59 PM

## 2021-09-20 NOTE — Evaluation (Signed)
Clinical/Bedside Swallow Evaluation Patient Details  Name: Joyce Maynard MRN: [id number] Date of Birth: May 21, 1940  Today's Date: 09/20/2021 Time: SLP Start Time (ACUTE ONLY): 0900 SLP Stop Time (ACUTE ONLY): 0945 SLP Time Calculation (min) (ACUTE ONLY): 45 min  Past Medical History:  Past Medical History:  Diagnosis Date   Breast cancer ([place]) 2005   s/p chemotherapy and XRT   Depression    Diabetes ([place])    GERD (gastroesophageal reflux disease)    Hx of vertigo    Hypercholesterolemia    Hypertension    Hypothyroidism    [place] 1948   history   PONV (postoperative nausea and vomiting)    Past Surgical History:  Past Surgical History:  Procedure Laterality Date   BREAST BIOPSY Left 2005   BREAST BIOPSY Left 10/25/2015   US guided biopsy   BREAST LUMPECTOMY Left 2005   EXCISION OF BREAST LESION Left 11/19/2015   Procedure: EXCISION OF BREAST LESION/ MASS;  Surgeon: Leonie Green, MD;  Location: [facility] ORS;  Service: General;  Laterality: Left;   EYE SURGERY     cataracts bilateral   TUBAL LIGATION     HPI:  Pt is a 81 y.o. 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  Symptoms started around midnight on 9/15 and did not present to the ED until several hours later when her daughter noted the slurring of her speech at 7 AM at which time she refused transport with EMS and she takes care of her elderly husband who has dementia.  She did eventually accept transport several hours later when she started developing worsening symptoms.  She has not followed up w/ her PCP in recent years per chart notes.  MRI: Small acute infarct of the left caudate nucleus. No hemorrhage or  mass effect.  2. 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     Assessment / Plan / Recommendation  Clinical Impression   Pt appears to present w/ oropharyngeal phase dysphagia w/ oropharyngeal phase deficits and suspected Neuromuscular impact; new CVA. She is at increased risk for aspiration. Pt consumed trials of single ice chips, Nectar consistency liquids via Cup/Straw, purees, and chopped solids(moistened) w/ No overt s/s of aspiration; no cough and no decline in respiratory presentation noted during/post trials. Immediate, overt clinical s/s of aspiration noted w/ trials of thin liquids(coughing). Suspect potential delayed pharyngeal swallowin initiation(d/t motor deficits, sensation). Oral phase was c/b min slower bolus management, mastication, and oral clearing of increased textured boluses given; slight hesitation in A-P transfer w/ Nectar liquids x2 -- again, suspect motor planning deficits and lingual weakness. She required increased Time for mastication and A-P transfer w/ increased textured, solid foods -- unsure if related to overall weakness, and decreased attention/awareness. Time b/t trials recommended to fully clear solid foods.  OM exam revealed R sided lingual/labial weakness; R lingual deviation w/ slower movements in connection. Cough strong. Dysarthria+.  Recommend dysphagia level 3 diet(chopped meats, moistened foods) w/ Nectar consistency liquids; aspiration precautions; Pills Whole in puree or w/ Nectar liquids for safety; Tray setup at meals d/t RUE weakness. Encouraged pt to check for R oral clearing post intake. NSG/MD updated.  ST services will f/u w/ trials to upgrade diet; MBSS TBD.  SLP Visit Diagnosis: Dysphagia, oropharyngeal phase (R13.12)    Aspiration Risk  Moderate  aspiration risk;Risk for inadequate nutrition/hydration    Diet Recommendation   dysphagia level 3 diet(chopped meats, moistened foods) w/ Nectar consistency liquids; aspiration precautions; tray setup at meals d/t RUE weakness. Encouraged pt to check for R oral  clearing post intake w/ lingual sweeping.  Medication Administration: Whole meds with puree (or w/ Nectar)    Other  Recommendations Recommended Consults:  (Dietician f/u) Oral Care Recommendations: Oral care BID;Oral care before and after PO;Staff/trained caregiver to provide oral care (setup) Other Recommendations: Order thickener from pharmacy;Prohibited food (jello, ice cream, thin soups);Remove water pitcher;Have oral suction available    Recommendations for follow up therapy are one component of a multi-disciplinary discharge planning process, led by the attending physician.  Recommendations may be updated based on patient status, additional functional criteria and insurance authorization.  Follow up Recommendations Skilled nursing-short term rehab (<3 hours/day) (TBD)      Assistance Recommended at Discharge Intermittent Supervision/Assistance (setup)  Functional Status Assessment Patient has had a recent decline in their functional status and demonstrates the ability to make significant improvements in function in a reasonable and predictable amount of time.  Frequency and Duration min 2x/week  2 weeks       Prognosis Prognosis for Safe Diet Advancement: Good Barriers to Reach Goals: Time post onset;Severity of deficits;Motivation      Swallow Study   General Date of Onset: 09/19/21 HPI: Pt is a 81 y.o. 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  Symptoms started around midnight on 9/15 and did not present to the ED until several hours later when her daughter noted the slurring of her speech at 7 AM at which time she refused transport with EMS and she takes care of her elderly husband who has dementia.  She did eventually accept  transport several hours later when she started developing worsening symptoms.  She has not followed up w/ her PCP in recent years per chart notes.  MRI: Small acute infarct of the left caudate nucleus. No hemorrhage or  mass effect.  2. 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 Type of Study: Bedside Swallow Evaluation Previous Swallow Assessment: none Diet Prior to this Study: Regular;Thin liquids (NPO) Temperature Spikes Noted: No (wbc 6.3) Respiratory Status: Room air History of Recent Intubation: No Behavior/Cognition: Alert;Pleasant mood;Cooperative Oral Cavity Assessment: Dry (min) Oral Care Completed by SLP: Yes Oral Cavity - Dentition: Missing dentition (many upper molars) Vision: Functional for self-feeding Self-Feeding Abilities: Able to feed self;Needs assist;Needs set up (RUE weakness) Patient Positioning: Upright in bed (needed some support d/t RUE weakness) Baseline Vocal Quality: Normal Volitional Cough: Strong Volitional Swallow: Able to elicit    Oral/Motor/Sensory Function Overall Oral Motor/Sensory Function: Moderate impairment Facial ROM: Reduced right;Suspected CN VII (facial) dysfunction Facial Symmetry: Abnormal symmetry right;Suspected CN VII (facial) dysfunction Facial Strength: Reduced right;Suspected CN VII (facial) dysfunction Lingual ROM: Reduced right;Suspected CN XII (hypoglossal) dysfunction (deviation) Lingual Symmetry: Abnormal symmetry right (deviation) Lingual Strength: Reduced;Suspected CN XII (hypoglossal) dysfunction (protrusion) Velum: Within Functional Limits Mandible: Within Functional Limits   Ice Chips Ice chips: Within functional limits (grossly for oral management) Presentation: Spoon (fed; 3 trials)   Thin Liquid Thin Liquid: Impaired Presentation: Cup;Self Fed (3-4 trials/swallows) Oral Phase Impairments:  (adequate) Pharyngeal  Phase Impairments: Cough - Immediate (x2/4)    Nectar Thick Nectar Thick Liquid:  Within functional  limits (grossly - mild hesitation of A-P transfer noted x2) Presentation: Cup;Self Fed;Straw (4 ozs)   Honey Thick Honey Thick Liquid: Not tested   Puree Puree: Within functional limits Presentation: Self Fed;Spoon (~2-3 ozs)   Solid     Solid: Impaired Presentation: Self Fed (4 trials) Oral Phase Impairments: Impaired mastication;Reduced lingual movement/coordination Oral Phase Functional Implications: Prolonged oral transit;Impaired mastication (missing dentition) Pharyngeal Phase Impairments:  (none) Other Comments: lingual sweeping and f/u swallow        Orinda Kenner, MS, CCC-SLP Speech Language Pathologist Rehab Services; [place] [phone] (ascom) Aseret Hoffman 09/20/2021,9:54 AM

## 2021-09-20 NOTE — Assessment & Plan Note (Signed)
Sliding scale insulin coverage 

## 2021-09-21 ENCOUNTER — Inpatient Hospital Stay (HOSPITAL_COMMUNITY)
Admit: 2021-09-21 | Discharge: 2021-09-21 | Disposition: A | Discharge status: Home / Self Care | Payer: Medicare HMO | Attending: Obstetrics and Gynecology | Admitting: Obstetrics and Gynecology

## 2021-09-21 DIAGNOSIS — I6389 Other cerebral infarction: Secondary | ICD-10-CM

## 2021-09-21 DIAGNOSIS — R279 Unspecified lack of coordination: Secondary | ICD-10-CM | POA: Diagnosis not present

## 2021-09-21 DIAGNOSIS — M4850XA Collapsed vertebra, not elsewhere classified, site unspecified, initial encounter for fracture: Secondary | ICD-10-CM | POA: Diagnosis not present

## 2021-09-21 DIAGNOSIS — I639 Cerebral infarction, unspecified: Secondary | ICD-10-CM | POA: Diagnosis not present

## 2021-09-21 DIAGNOSIS — Z743 Need for continuous supervision: Secondary | ICD-10-CM | POA: Diagnosis not present

## 2021-09-21 LAB — GLUCOSE, CAPILLARY
Glucose-Capillary: 116 mg/dL — ABNORMAL HIGH (ref 70–99)
Glucose-Capillary: 142 mg/dL — ABNORMAL HIGH (ref 70–99)
Glucose-Capillary: 142 mg/dL — ABNORMAL HIGH (ref 70–99)
Glucose-Capillary: 173 mg/dL — ABNORMAL HIGH (ref 70–99)
Glucose-Capillary: 193 mg/dL — ABNORMAL HIGH (ref 70–99)

## 2021-09-21 LAB — ECHOCARDIOGRAM COMPLETE
Area-P 1/2: 2.32 cm2
Height: 66 in
S' Lateral: 2.6 cm
Weight: 2744.29 oz

## 2021-09-21 MED ORDER — ATORVASTATIN CALCIUM 80 MG PO TABS: 80.0000 mg | ORAL_TABLET | Freq: Every day | ORAL | 1 refills | Status: DC

## 2021-09-21 MED ORDER — ASPIRIN EC 81 MG PO TBEC: 81.0000 mg | DELAYED_RELEASE_TABLET | Freq: Every evening | ORAL | 0 refills | Status: AC

## 2021-09-21 MED ORDER — CLOPIDOGREL BISULFATE 75 MG PO TABS: 75.0000 mg | ORAL_TABLET | Freq: Every day | ORAL | 1 refills | Status: DC

## 2021-09-21 NOTE — Discharge Summary (Signed)
SHAYNNA HUSBY Maynard:[id number] DOB: 1940/06/27 DOA: 09/19/2021  PCP: Einar Pheasant, MD  Admit date: 09/19/2021 Discharge date: 09/21/2021  Time spent: 45 minutes  Recommendations for Outpatient Follow-up:  Pcp f/u Neurology f/u (referred)     Discharge Diagnoses:  Principal Problem:   Acute CVA (cerebrovascular accident) [facility]) Active Problems:   Essential hypertension, benign   Type II diabetes mellitus with renal manifestations ([place])   Hypothyroidism   CKD (chronic kidney disease), stage IIIa   Discharge Condition: stable  Diet recommendation: heart healthy  Filed Weights   09/19/21 2320 09/20/21 0229  Weight: 78.3 kg 77.8 kg    History of present illness:  From admission h and p Joyce Maynard is a 81 y.o. 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 Symptoms started around midnight on 9/15 and did not present to the ED until several hours later when her daughter noted the slurring of her speech at 7 AM at which time she refused transport with EMS and she takes care of her elderly husband who has dementia.  She did eventually accept transport several hours later when she started developing worsening symptoms. ED course and data review: BP 162/92 with otherwise normal vitals and labs within normal limits except for creatinine 1.15 which is her baseline.  CT head and CTA head as part of code stroke protocol were both unremarkable. Neurology was consulted from the ED who recommended MRI brain and load with Plavix if her stroke is seen in further work-up as will be outlined in assessment and plan.  Patient received 324 mg aspirin and hospitalist consulted for admission.   Hospital Course:   # Acute CVA To left caudate nucleus. With  dysarthria and right upper extremity weakness. S/p aspirin and plavix load in ED. TTE unremarkable. No events on tele - continue aspirin/plavix/statin, dapt for 3 wks followed by plavix monotherapy - PT/OT advise SNF but patient declines. Home health ordered   # HTN Here bp elevated, allowing permissive htn   # Hypothyroid - cont home synthroid   # T2DM Glucose appropriate - SSI - hold home metformin   # CKD 3a At baseline - monitor   # MDD - cont home effexor, celexa   # Chronic pain - home tramadol   Procedures: none   Consultations: neurology  Discharge Exam: Vitals:   09/21/21 0855 09/21/21 1221  BP: (!) 167/86 (!) 156/85  Pulse: 65 90  Resp: 18 18  Temp: 98 F (36.7 C) 98.3 F (36.8 C)  SpO2: 100% 99%    General: NAD Cardiovascular: RRR Respiratory: CTAB Neuro: right facial droop, rue weakness  Discharge Instructions   Discharge Instructions     Ambulatory referral to Neurology   Complete by: As directed    Diet - low sodium heart healthy   Complete by: As directed    Increase activity slowly   Complete by: As directed       Allergies as of 09/21/2021   No Known Allergies      Medication List     TAKE these medications    acetaminophen 500 MG tablet Commonly known as: TYLENOL Take 2 tablets (1,000 mg total) by mouth every 8 (eight) hours.   aspirin EC 81 MG tablet Take 1 tablet (81 mg total) by mouth every evening for 21 days.  atorvastatin 80 MG tablet Commonly known as: LIPITOR Take 1 tablet (80 mg total) by mouth daily. Start taking on: September 22, 2021   citalopram 10 MG tablet Commonly known as: CELEXA Take 1 tablet by mouth once daily   clopidogrel 75 MG tablet Commonly known as: PLAVIX Take 1 tablet (75 mg total) by mouth daily. Start taking on: September 22, 2021   docusate sodium 100 MG capsule Commonly known as: COLACE Take 1 capsule (100 mg total) by mouth 2 (two) times daily.   FreeStyle Libre 3 Sensor  Misc Place 1 sensor on the skin every 14 days. Use to check glucose continuously   levothyroxine 125 MCG tablet Commonly known as: SYNTHROID Take 1 tablet by mouth once daily   lidocaine 4 % cream Commonly known as: LMX Apply topically 3 (three) times daily as needed (back pain).   metFORMIN 500 MG 24 hr tablet Commonly known as: GLUCOPHAGE-XR Take 500 mg by mouth daily with supper.   metoprolol succinate 25 MG 24 hr tablet Commonly known as: TOPROL-XL Take 1 tablet by mouth once daily What changed: when to take this   pantoprazole 40 MG tablet Commonly known as: PROTONIX TAKE 1 TABLET BY MOUTH TWICE DAILY WITH A MEAL What changed: when to take this   traMADol 50 MG tablet Commonly known as: ULTRAM Take 1 tablet (50 mg total) by mouth every 6 (six) hours as needed for moderate pain or severe pain (despite tylenol).   venlafaxine XR 150 MG 24 hr capsule Commonly known as: EFFEXOR-XR Take 1 capsule by mouth once daily What changed: how to take this               Durable Medical Equipment  (From admission, onward)           Start     Ordered   09/21/21 1214  For home use only DME lightweight manual wheelchair with seat cushion  Once       Comments: Patient suffers from stroke which impairs their ability to perform daily activities like bathing, dressing, feeding, grooming, and toileting in the home.  A walker will not resolve  issue with performing activities of daily living. A wheelchair will allow patient to safely perform daily activities. Patient is not able to propel themselves in the home using a standard weight wheelchair due to arm weakness and endurance. Patient can self propel in the lightweight wheelchair. Length of need 12 months . Accessories: elevating leg rests (ELRs), wheel locks, extensions and anti-tippers.   09/21/21 1213           No Known Allergies    The results of significant diagnostics from this hospitalization (including imaging,  microbiology, ancillary and laboratory) are listed below for reference.    Significant Diagnostic Studies: ECHOCARDIOGRAM COMPLETE  Result Date: 09/21/2021    ECHOCARDIOGRAM REPORT   Patient Name:   ILIANNA Maynard Date of Exam: 09/21/2021 Medical Rec #:  [id number]    Height:       66.0 in Accession #:    [id number]   Weight:       171.5 lb Date of Birth:  [birth year]    BSA:          1.873 m Patient Age:    89 years     BP:           171/97 mmHg Patient Gender: F            HR:  76 bpm. Exam Location:  Inpatient Procedure: 2D Echo, Cardiac Doppler and Color Doppler Indications:     Stroke I63.9  History:         Patient has prior history of Echocardiogram examinations, most                  recent 10/19/2019. Risk Factors:Hypertension, Diabetes and                  Dyslipidemia. Past history of breast cancer.  Sonographer:     Darlina Sicilian RDCS Referring Phys:  Athena Masse Diagnosing Phys: Fransico Him MD IMPRESSIONS  1. Left ventricular ejection fraction, by estimation, is 60 to 65%. The left ventricle has normal function. The left ventricle has no regional wall motion abnormalities. There is mild left ventricular hypertrophy of the basal-septal segment. Left ventricular diastolic parameters are consistent with Grade I diastolic dysfunction (impaired relaxation). Elevated left ventricular end-diastolic pressure.  2. Right ventricular systolic function is normal. The right ventricular size is normal. Tricuspid regurgitation signal is inadequate for assessing PA pressure.  3. The mitral valve is normal in structure. Trivial mitral valve regurgitation. No evidence of mitral stenosis.  4. The aortic valve is tricuspid. Aortic valve regurgitation is trivial. Aortic valve sclerosis/calcification is present, without any evidence of aortic stenosis.  5. The inferior vena cava is normal in size with greater than 50% respiratory variability, suggesting right atrial pressure of 3 mmHg. FINDINGS  Left Ventricle:  Left ventricular ejection fraction, by estimation, is 60 to 65%. The left ventricle has normal function. The left ventricle has no regional wall motion abnormalities. The left ventricular internal cavity size was normal in size. There is  mild left ventricular hypertrophy of the basal-septal segment. Left ventricular diastolic parameters are consistent with Grade I diastolic dysfunction (impaired relaxation). Elevated left ventricular end-diastolic pressure. Right Ventricle: The right ventricular size is normal. No increase in right ventricular wall thickness. Right ventricular systolic function is normal. Tricuspid regurgitation signal is inadequate for assessing PA pressure. Left Atrium: Left atrial size was normal in size. Right Atrium: Right atrial size was normal in size. Pericardium: There is no evidence of pericardial effusion. Mitral Valve: The mitral valve is normal in structure. Mild mitral annular calcification. Trivial mitral valve regurgitation. No evidence of mitral valve stenosis. Tricuspid Valve: The tricuspid valve is normal in structure. Tricuspid valve regurgitation is trivial. No evidence of tricuspid stenosis. Aortic Valve: The aortic valve is tricuspid. Aortic valve regurgitation is trivial. Aortic valve sclerosis/calcification is present, without any evidence of aortic stenosis. Pulmonic Valve: The pulmonic valve was normal in structure. Pulmonic valve regurgitation is trivial. No evidence of pulmonic stenosis. Aorta: The aortic root is normal in size and structure. Venous: The inferior vena cava is normal in size with greater than 50% respiratory variability, suggesting right atrial pressure of 3 mmHg. IAS/Shunts: No atrial level shunt detected by color flow Doppler.  LEFT VENTRICLE PLAX 2D LVIDd:         3.60 cm   Diastology LVIDs:         2.60 cm   LV e' medial:    3.81 cm/s LV PW:         1.00 cm   LV E/e' medial:  15.8 LV IVS:        1.20 cm   LV e' lateral:   5.06 cm/s LVOT diam:      2.10 cm   LV E/e' lateral: 11.9 LV SV:  54 LV SV Index:   29 LVOT Area:     3.46 cm  RIGHT VENTRICLE RV S prime:     7.49 cm/s TAPSE (M-mode): 1.5 cm LEFT ATRIUM             Index        RIGHT ATRIUM          Index LA diam:        3.20 cm 1.71 cm/m   RA Area:     7.17 cm LA Vol (A2C):   25.9 ml 13.83 ml/m  RA Volume:   10.10 ml 5.39 ml/m LA Vol (A4C):   32.0 ml 17.08 ml/m LA Biplane Vol: 30.2 ml 16.12 ml/m  AORTIC VALVE LVOT Vmax:   72.70 cm/s LVOT Vmean:  51.000 cm/s LVOT VTI:    0.157 m  AORTA Ao Root diam: 3.30 cm Ao Asc diam:  3.20 cm MITRAL VALVE MV Area (PHT): 2.32 cm    SHUNTS MV Decel Time: 327 msec    Systemic VTI:  0.16 m MV E velocity: 60.30 cm/s  Systemic Diam: 2.10 cm MV A velocity: 92.80 cm/s MV E/A ratio:  0.65 Fransico Him MD Electronically signed by Fransico Him MD Signature Date/Time: 09/21/2021/1:34:54 PM    Final    MR BRAIN WO CONTRAST  Result Date: 09/20/2021 CLINICAL DATA:  Stroke-like symptoms EXAM: MRI HEAD WITHOUT CONTRAST TECHNIQUE: Multiplanar, multiecho pulse sequences of the brain and surrounding structures were obtained without intravenous contrast. COMPARISON:  10/17/2019 brain MRI Head CT 09/19/2021 FINDINGS: Brain: There is a small acute infarct of the left caudate nucleus. No acute or chronic hemorrhage. There is multifocal hyperintense T2-weighted signal within the white matter. Generalized volume loss. The midline structures are normal. Vascular: Major flow voids are preserved. Skull and upper cervical spine: Normal calvarium and skull base. Visualized upper cervical spine and soft tissues are normal. Sinuses/Orbits:No paranasal sinus fluid levels or advanced mucosal thickening. No mastoid or middle ear effusion. Normal orbits. IMPRESSION: 1. Small acute infarct of the left caudate nucleus. No hemorrhage or mass effect. 2. 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 Electronically Signed   By: Ulyses Jarred M.D.   On: 09/20/2021 01:58   CT ANGIO HEAD  NECK W WO CM (CODE STROKE)  Result Date: 09/19/2021 CLINICAL DATA:  Right Side weakness EXAM: CT ANGIOGRAPHY HEAD AND NECK TECHNIQUE: Multidetector CT imaging of the head and neck was performed using the standard protocol during bolus administration of intravenous contrast. Multiplanar CT image reconstructions and MIPs were obtained to evaluate the vascular anatomy. Carotid stenosis measurements (when applicable) are obtained utilizing NASCET criteria, using the distal internal carotid diameter as the denominator. RADIATION DOSE REDUCTION: This exam was performed according to the departmental dose-optimization program which includes automated exposure control, adjustment of the mA and/or kV according to patient size and/or use of iterative reconstruction technique. CONTRAST:  64m OMNIPAQUE IOHEXOL 350 MG/ML SOLN COMPARISON:  None Available. FINDINGS: CTA NECK FINDINGS SKELETON: There is no bony spinal canal stenosis. No lytic or blastic lesion. OTHER NECK: Normal pharynx, larynx and major salivary glands. No cervical lymphadenopathy. Unremarkable thyroid gland. UPPER CHEST: No pneumothorax or pleural effusion. No nodules or masses. AORTIC ARCH: There is no calcific atherosclerosis of the aortic arch. There is no aneurysm, dissection or hemodynamically significant stenosis of the visualized portion of the aorta. Conventional 3 vessel aortic branching pattern. The visualized proximal subclavian arteries are widely patent. RIGHT CAROTID SYSTEM: Normal without aneurysm, dissection or stenosis. LEFT CAROTID SYSTEM:  Normal without aneurysm, dissection or stenosis. VERTEBRAL ARTERIES: Left dominant configuration. Both origins are clearly patent. There is no dissection, occlusion or flow-limiting stenosis to the skull base (V1-V3 segments). CTA HEAD FINDINGS POSTERIOR CIRCULATION: --Vertebral arteries: Normal V4 segments. --Inferior cerebellar arteries: Normal. --Basilar artery: Normal. --Superior cerebellar arteries:  Normal. --Posterior cerebral arteries (PCA): There is severe stenosis of the right PCA P2 segment, but patency is maintained distally. There is occlusion of the mid to distal P2 segment of the left PCA. ANTERIOR CIRCULATION: --Intracranial internal carotid arteries: Normal. --Anterior cerebral arteries (ACA): Normal. Both A1 segments are present. Patent anterior communicating artery (a-comm). --Middle cerebral arteries (MCA): Normal. VENOUS SINUSES: As permitted by contrast timing, patent. ANATOMIC VARIANTS: None Review of the MIP images confirms the above findings. IMPRESSION: 1. Normal cervical and anterior intracranial circulation. 2. 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 3. 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 4. Aortic Atherosclerosis (ICD10-I70.0). Electronically Signed   By: Ulyses Jarred M.D.   On: 09/19/2021 23:45   CT HEAD CODE STROKE WO CONTRAST`  Result Date: 09/19/2021 CLINICAL DATA:  Code stroke.  Acute neurologic deficit EXAM: CT HEAD WITHOUT CONTRAST TECHNIQUE: Contiguous axial images were obtained from the base of the skull through the vertex without intravenous contrast. RADIATION DOSE REDUCTION: This exam was performed according to the departmental dose-optimization program which includes automated exposure control, adjustment of the mA and/or kV according to patient size and/or use of iterative reconstruction technique. COMPARISON:  None Available. FINDINGS: Brain: There is no mass, hemorrhage or extra-axial collection. There is generalized atrophy without lobar predilection. There is hypoattenuation of the periventricular white matter, most commonly indicating chronic ischemic microangiopathy. Vascular: No abnormal hyperdensity of the major intracranial arteries or dural venous sinuses. No intracranial atherosclerosis. Skull: The visualized skull base, calvarium and extracranial soft tissues are normal. Sinuses/Orbits: No fluid levels or advanced mucosal  thickening of the visualized paranasal sinuses. No mastoid or middle ear effusion. The orbits are normal. ASPECTS [facility] Stroke Program Early CT Score) - Ganglionic level infarction (caudate, lentiform nuclei, internal capsule, insula, M1-M3 cortex): 7 - Supraganglionic infarction (M4-M6 cortex): 3 Total score (0-10 with 10 being normal): 10 IMPRESSION: 1. No acute intracranial abnormality. 2. ASPECTS is 10. These results were called by telephone at the time of interpretation on 09/19/2021 at 11:16 pm to provider [facility] , who verbally acknowledged these results. Electronically Signed   By: Ulyses Jarred M.D.   On: 09/19/2021 23:16    Microbiology: Recent Results (from the past 240 hour(s))  Resp Panel by RT-PCR (Flu A&B, Covid) Anterior Nasal Swab     Status: None   Collection Time: 09/19/21 11:20 PM   Specimen: Anterior Nasal Swab  Result Value Ref Range Status   SARS Coronavirus 2 by RT PCR NEGATIVE NEGATIVE Final    Comment: (NOTE) SARS-CoV-2 target nucleic acids are NOT DETECTED.  The SARS-CoV-2 RNA is generally detectable in upper respiratory specimens during the acute phase of infection. The lowest concentration of SARS-CoV-2 viral copies this assay can detect is 138 copies/mL. A negative result does not preclude SARS-Cov-2 infection and should not be used as the sole basis for treatment or other patient management decisions. A negative result may occur with  improper specimen collection/handling, submission of specimen other than nasopharyngeal swab, presence of viral mutation(s) within the areas targeted by this assay, and inadequate number of viral copies(<138 copies/mL). A negative result must be combined with clinical observations, patient history,  and epidemiological information. The expected result is Negative.  Fact Sheet for Patients:  EntrepreneurPulse.com.au  Fact Sheet for Healthcare Providers:  IncredibleEmployment.be  This  test is no t yet approved or cleared by the [place] FDA and  has been authorized for detection and/or diagnosis of SARS-CoV-2 by FDA under an Emergency Use Authorization (EUA). This EUA will remain  in effect (meaning this test can be used) for the duration of the COVID-19 declaration under Section 564(b)(1) of the Act, 21 U.S.C.section 360bbb-3(b)(1), unless the authorization is terminated  or revoked sooner.       Influenza A by PCR NEGATIVE NEGATIVE Final   Influenza B by PCR NEGATIVE NEGATIVE Final    Comment: (NOTE) The Xpert Xpress SARS-CoV-2/FLU/RSV plus assay is intended as an aid in the diagnosis of influenza from Nasopharyngeal swab specimens and should not be used as a sole basis for treatment. Nasal washings and aspirates are unacceptable for Xpert Xpress SARS-CoV-2/FLU/RSV testing.  Fact Sheet for Patients: EntrepreneurPulse.com.au  Fact Sheet for Healthcare Providers: IncredibleEmployment.be  This test is not yet approved or cleared by the [place] FDA and has been authorized for detection and/or diagnosis of SARS-CoV-2 by FDA under an Emergency Use Authorization (EUA). This EUA will remain in effect (meaning this test can be used) for the duration of the COVID-19 declaration under Section 564(b)(1) of the Act, 21 U.S.C. section 360bbb-3(b)(1), unless the authorization is terminated or revoked.  Performed at [facility], [address] [place], [place] [address]      Labs: Basic Metabolic Panel: Recent Labs  Lab 09/19/21 2303  NA 142  K 4.0  CL 111  CO2 23  GLUCOSE 152*  BUN 22  CREATININE 1.15*  CALCIUM 9.4   Liver Function Tests: Recent Labs  Lab 09/19/21 2303  AST 18  ALT 15  ALKPHOS 70  BILITOT 0.6  PROT 7.8  ALBUMIN 3.8   No results for input(s): "LIPASE", "AMYLASE" in the last 168 hours. No results for input(s): "AMMONIA" in the last 168 hours. CBC: Recent Labs  Lab  09/19/21 2303  WBC 6.3  NEUTROABS 4.2  HGB 13.7  HCT 41.0  MCV 100.0  PLT 211   Cardiac Enzymes: No results for input(s): "CKTOTAL", "CKMB", "CKMBINDEX", "TROPONINI" in the last 168 hours. BNP: BNP (last 3 results) No results for input(s): "BNP" in the last 8760 hours.  ProBNP (last 3 results) No results for input(s): "PROBNP" in the last 8760 hours.  CBG: Recent Labs  Lab 09/20/21 2035 09/21/21 0030 09/21/21 0457 09/21/21 0853 09/21/21 1220  GLUCAP 140* 173* 142* 116* 193*       Signed:  Desma Maxim MD.  [facility] 09/21/2021, 4:30 PM

## 2021-09-21 NOTE — Progress Notes (Cosign Needed)
    Durable Medical Equipment  (From admission, onward)           Start     Ordered   09/21/21 1126  For home use only DME lightweight manual wheelchair with seat cushion  Once       Comments: Patient suffers from stroke which impairs their ability to perform daily activities like bathing, dressing, feeding, grooming, and toileting in the home.  A walker will not resolve  issue with performing activities of daily living. A wheelchair will allow patient to safely perform daily activities. Patient is not able to propel themselves in the home using a standard weight wheelchair due to arm weakness and endurance. Patient can self propel in the lightweight wheelchair. Length of need 6 months . Accessories: elevating leg rests (ELRs), wheel locks, extensions and anti-tippers.   09/21/21 1125

## 2021-09-21 NOTE — TOC Transition Note (Addendum)
Transition of Care [facility]) - CM/SW Discharge Note   Patient Details  Name: Joyce Maynard MRN: [id number] Date of Birth: Jun 02, 1940  [facility]) CM/SW Contact:  Harriet Masson, RN Phone [phone] 09/21/2021, 11:06 AM   Clinical Narrative:    Requested to call the pt's daughter [name] concerning possible information on sitter services. Spoke with the daughter as requested once again on level of care and sitter services. Requested daughter to seeking such services independently and inquire on the cost for that particular agency. Daughter indicated she has research and aware of the cost and required hours in order to obtain services. Daughter is aware due to pt's household income this is covered by out of pocket expense and not the insurance. No other request at this time as name of [place] agency provided to the daughter if she needed to reach the agency on the initial home visit.   Addendum: Request for wheelchair. Spoke with daughter [name] receptive to [employer].   TOC remains available if additional needs are presented.   Final next level of care: Home/Self Care Barriers to Discharge: Continued Medical Work up   Patient Goals and CMS Choice     Choice offered to / list presented to : Patient, Adult Children  Discharge Placement                       Discharge Plan and Services                                     Social Determinants of Health (SDOH) Interventions     Readmission Risk Interventions     No data to display

## 2021-09-21 NOTE — Progress Notes (Signed)
  Echocardiogram 2D Echocardiogram has been performed.  Joyce Maynard M 09/21/2021, 8:29 AM

## 2021-09-21 NOTE — Progress Notes (Signed)
Patient being discharged home with daughter. IV removed. Discharge instructions and medications were went over with daughter. Patient was took out by w/c and the vehicle was a large and tall SUV and patient was very weak and was unable to lift her leg up to get up on the step to get in the vehicle. We tried several times with no results. Patient was tired and SOB. Patient was placed back in the w/c and brought back to room. EMS had to be called to transport patient home.

## 2021-09-21 NOTE — Progress Notes (Signed)
Physical Therapy Treatment Patient Details Name: Joyce Maynard MRN: [id number] DOB: 12-Jun-1940 Today's Date: 09/21/2021   History of Present Illness Joyce Maynard is a 81 y.o. 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 MRI brain showed small acute infarct of the left caudate nucleus.    PT Comments    Pt is making gradual progress towards goals with ability to tolerate 2 standing attempts using RW, however fatigues quickly and demonstrates R hemiparesis. Cues for managing transfers at home. Recommend WC for home use to promote indep as she will have limited support at home. Continue to recommend SNF if patient agrees. Will continue to progress.   Recommendations for follow up therapy are one component of a multi-disciplinary discharge planning process, led by the attending physician.  Recommendations may be updated based on patient status, additional functional criteria and insurance authorization.  Follow Up Recommendations  Skilled nursing-short term rehab (<3 hours/day) Can patient physically be transported by private vehicle: No   Assistance Recommended at Discharge Frequent or constant Supervision/Assistance  Patient can return home with the following Two people to help with walking and/or transfers;A lot of help with bathing/dressing/bathroom;Help with stairs or ramp for entrance   Equipment Recommendations  Rolling walker (2 wheels);Wheelchair (measurements PT)    Recommendations for Other Services       Precautions / Restrictions Precautions Precautions: Fall Restrictions Weight Bearing Restrictions: No     Mobility  Bed Mobility Overal bed mobility: Needs Assistance Bed Mobility: Supine to Sit     Supine to sit: Mod assist      General bed mobility comments: step by step cues for sequencing. Assist for trunkal elevation. Once seated, tend to demo post LOB with several corrections by therapist    Transfers Overall transfer level: Needs assistance Equipment used: Rolling walker (2 wheels) Transfers: Sit to/from Stand Sit to Stand: Mod assist           General transfer comment: needs step by step cues for ant translation over BOS. once standing, R slight lateral lean with min assist and cues for upright posture. Fatigues quickly and only able to maintain for 1 min. 2 standing attempts performed with cues for posture    Ambulation/Gait               General Gait Details: not safe due to quick fatigue   Stairs             Wheelchair Mobility    Modified Rankin (Stroke Patients Only)       Balance Overall balance assessment: Needs assistance Sitting-balance support: Feet supported, Bilateral upper extremity supported Sitting balance-Leahy Scale: Fair Sitting balance - Comments: several LOB in post direction   Standing balance support: Bilateral upper extremity supported, Reliant on assistive device for balance Standing balance-Leahy Scale: Poor Standing balance comment: poor standing tolerance                            Cognition Arousal/Alertness: Awake/alert Behavior During Therapy: WFL for tasks assessed/performed Overall Cognitive Status: Within Functional Limits for tasks assessed                                 General Comments: alert and  oriented. Pleasant and agreeable to session. Still demonstrates difficulty with speech        Exercises Other Exercises Other Exercises: supine ther-ex performed on R LE including SLR, heel slides, and shoulder flexion. 5 reps with supervision with quick fatigue    General Comments        Pertinent Vitals/Pain Pain Assessment Pain Assessment: Faces Faces Pain Scale: Hurts little more Pain Location: low  back Pain Descriptors / Indicators: Aching Pain Intervention(s): Limited activity within patient's tolerance, Repositioned    Home Living                          Prior Function            PT Goals (current goals can now be found in the care plan section) Acute Rehab PT Goals Patient Stated Goal: " I want to get better at my home. I don't want to go to SNF." PT Goal Formulation: With patient Time For Goal Achievement: 10/04/21 Potential to Achieve Goals: Fair Progress towards PT goals: Progressing toward goals    Frequency    7X/week      PT Plan Frequency needs to be updated    Co-evaluation              AM-PAC PT "6 Clicks" Mobility   Outcome Measure  Help needed turning from your back to your side while in a flat bed without using bedrails?: A Little Help needed moving from lying on your back to sitting on the side of a flat bed without using bedrails?: A Lot Help needed moving to and from a bed to a chair (including a wheelchair)?: A Lot Help needed standing up from a chair using your arms (e.g., wheelchair or bedside chair)?: A Lot Help needed to walk in hospital room?: Total Help needed climbing 3-5 steps with a railing? : Total 6 Click Score: 11    End of Session Equipment Utilized During Treatment: Gait belt Activity Tolerance: Patient tolerated treatment well Patient left: in bed;with bed alarm set Nurse Communication: Mobility status PT Visit Diagnosis: Unsteadiness on feet (R26.81);Other abnormalities of gait and mobility (R26.89);Muscle weakness (generalized) (M62.81);Difficulty in walking, not elsewhere classified (R26.2);Dizziness and giddiness (R42);Hemiplegia and hemiparesis Hemiplegia - Right/Left: Right Hemiplegia - dominant/non-dominant: Dominant Hemiplegia - caused by: Cerebral infarction     Time: 2111-5520 PT Time Calculation (min) (ACUTE ONLY): 23 min  Charges:  $Therapeutic Exercise: 8-22 mins $Therapeutic Activity:  8-22 mins                     Greggory Stallion, PT, DPT, GCS [phone]    Joyce Maynard 09/21/2021, 11:05 AM

## 2021-09-22 LAB — GLUCOSE, CAPILLARY: Glucose-Capillary: 139 mg/dL — ABNORMAL HIGH (ref 70–99)

## 2021-09-23 DIAGNOSIS — Z7982 Long term (current) use of aspirin: Secondary | ICD-10-CM | POA: Diagnosis not present

## 2021-09-23 DIAGNOSIS — Z993 Dependence on wheelchair: Secondary | ICD-10-CM | POA: Diagnosis not present

## 2021-09-23 DIAGNOSIS — I69322 Dysarthria following cerebral infarction: Secondary | ICD-10-CM | POA: Diagnosis not present

## 2021-09-23 DIAGNOSIS — Z7984 Long term (current) use of oral hypoglycemic drugs: Secondary | ICD-10-CM | POA: Diagnosis not present

## 2021-09-23 DIAGNOSIS — N1831 Chronic kidney disease, stage 3a: Secondary | ICD-10-CM | POA: Diagnosis not present

## 2021-09-23 DIAGNOSIS — E1122 Type 2 diabetes mellitus with diabetic chronic kidney disease: Secondary | ICD-10-CM | POA: Diagnosis not present

## 2021-09-23 DIAGNOSIS — E7800 Pure hypercholesterolemia, unspecified: Secondary | ICD-10-CM | POA: Diagnosis not present

## 2021-09-23 DIAGNOSIS — K219 Gastro-esophageal reflux disease without esophagitis: Secondary | ICD-10-CM | POA: Diagnosis not present

## 2021-09-23 DIAGNOSIS — M4850XD Collapsed vertebra, not elsewhere classified, site unspecified, subsequent encounter for fracture with routine healing: Secondary | ICD-10-CM | POA: Diagnosis not present

## 2021-09-23 DIAGNOSIS — Z7902 Long term (current) use of antithrombotics/antiplatelets: Secondary | ICD-10-CM | POA: Diagnosis not present

## 2021-09-23 DIAGNOSIS — R69 Illness, unspecified: Secondary | ICD-10-CM | POA: Diagnosis not present

## 2021-09-23 DIAGNOSIS — I639 Cerebral infarction, unspecified: Secondary | ICD-10-CM | POA: Diagnosis not present

## 2021-09-23 DIAGNOSIS — I129 Hypertensive chronic kidney disease with stage 1 through stage 4 chronic kidney disease, or unspecified chronic kidney disease: Secondary | ICD-10-CM | POA: Diagnosis not present

## 2021-09-23 DIAGNOSIS — Z8612 Personal history of poliomyelitis: Secondary | ICD-10-CM | POA: Diagnosis not present

## 2021-09-23 DIAGNOSIS — I69351 Hemiplegia and hemiparesis following cerebral infarction affecting right dominant side: Secondary | ICD-10-CM | POA: Diagnosis not present

## 2021-09-23 DIAGNOSIS — E039 Hypothyroidism, unspecified: Secondary | ICD-10-CM | POA: Diagnosis not present

## 2021-09-23 DIAGNOSIS — G8929 Other chronic pain: Secondary | ICD-10-CM | POA: Diagnosis not present

## 2021-09-23 DIAGNOSIS — I69391 Dysphagia following cerebral infarction: Secondary | ICD-10-CM | POA: Diagnosis not present

## 2021-09-23 DIAGNOSIS — Z853 Personal history of malignant neoplasm of breast: Secondary | ICD-10-CM | POA: Diagnosis not present

## 2021-09-25 ENCOUNTER — Telehealth: Payer: Self-pay | Admitting: Internal Medicine

## 2021-09-25 DIAGNOSIS — E039 Hypothyroidism, unspecified: Secondary | ICD-10-CM | POA: Diagnosis not present

## 2021-09-25 DIAGNOSIS — N1831 Chronic kidney disease, stage 3a: Secondary | ICD-10-CM | POA: Diagnosis not present

## 2021-09-25 DIAGNOSIS — G8929 Other chronic pain: Secondary | ICD-10-CM | POA: Diagnosis not present

## 2021-09-25 DIAGNOSIS — Z853 Personal history of malignant neoplasm of breast: Secondary | ICD-10-CM | POA: Diagnosis not present

## 2021-09-25 DIAGNOSIS — E1122 Type 2 diabetes mellitus with diabetic chronic kidney disease: Secondary | ICD-10-CM | POA: Diagnosis not present

## 2021-09-25 DIAGNOSIS — Z7902 Long term (current) use of antithrombotics/antiplatelets: Secondary | ICD-10-CM | POA: Diagnosis not present

## 2021-09-25 DIAGNOSIS — R69 Illness, unspecified: Secondary | ICD-10-CM | POA: Diagnosis not present

## 2021-09-25 DIAGNOSIS — I69351 Hemiplegia and hemiparesis following cerebral infarction affecting right dominant side: Secondary | ICD-10-CM | POA: Diagnosis not present

## 2021-09-25 DIAGNOSIS — Z8612 Personal history of poliomyelitis: Secondary | ICD-10-CM | POA: Diagnosis not present

## 2021-09-25 DIAGNOSIS — I69391 Dysphagia following cerebral infarction: Secondary | ICD-10-CM | POA: Diagnosis not present

## 2021-09-25 DIAGNOSIS — Z993 Dependence on wheelchair: Secondary | ICD-10-CM | POA: Diagnosis not present

## 2021-09-25 DIAGNOSIS — K219 Gastro-esophageal reflux disease without esophagitis: Secondary | ICD-10-CM | POA: Diagnosis not present

## 2021-09-25 DIAGNOSIS — E7800 Pure hypercholesterolemia, unspecified: Secondary | ICD-10-CM | POA: Diagnosis not present

## 2021-09-25 DIAGNOSIS — Z7984 Long term (current) use of oral hypoglycemic drugs: Secondary | ICD-10-CM | POA: Diagnosis not present

## 2021-09-25 DIAGNOSIS — I129 Hypertensive chronic kidney disease with stage 1 through stage 4 chronic kidney disease, or unspecified chronic kidney disease: Secondary | ICD-10-CM | POA: Diagnosis not present

## 2021-09-25 DIAGNOSIS — M4850XD Collapsed vertebra, not elsewhere classified, site unspecified, subsequent encounter for fracture with routine healing: Secondary | ICD-10-CM | POA: Diagnosis not present

## 2021-09-25 DIAGNOSIS — I69322 Dysarthria following cerebral infarction: Secondary | ICD-10-CM | POA: Diagnosis not present

## 2021-09-25 DIAGNOSIS — Z7982 Long term (current) use of aspirin: Secondary | ICD-10-CM | POA: Diagnosis not present

## 2021-09-25 NOTE — Telephone Encounter (Signed)
I can do a virtual visit for hospital follow up with her.  His father does need to keep his appt.

## 2021-09-25 NOTE — Telephone Encounter (Signed)
Called and spoke to Joyce Maynard and scheduled VV for Tues @  11am for Hosp/f/up. He stated that his Sister  would be the contact person for VV for Mom on [address] (Daughter)  [phone] [facility]

## 2021-09-25 NOTE — Telephone Encounter (Signed)
Spoke to patients son [name] and he stated that his Mom just had a stroke and is not able to walk to come into office for appt.He stated that he can still bring Dad in for appt though.He stated that if you need any type of info that he could provide it because mom speech is slurred. He stated that he can do a VV but that is the only solution right now

## 2021-09-25 NOTE — Telephone Encounter (Signed)
Pt son called stating pt had a stroke and can not come to her appointment because she can not walk. Pt son would like to be called

## 2021-09-26 ENCOUNTER — Telehealth: Payer: Self-pay

## 2021-09-26 NOTE — Telephone Encounter (Signed)
Data is been received. Joyce Maynard is aware it is working.

## 2021-09-26 NOTE — Telephone Encounter (Signed)
Patient's daughter, [name] (not on patient's DPR), is calling to request  office ID number to reset patient's libre.  I gave [name] the number, as it is not patient-specific.  [name] states she entered the number ([id number]) and it says on her end that it is connected, but she would like to verify that we are receiving the data on our end.  [name] states her brother, Nashika Coker, is patient's [place], and he will be coming in with their father next week for an office visit.  [name] states [name] will fill out a DPR at that time so that we will be able to share patient information with [name].

## 2021-09-30 ENCOUNTER — Telehealth (INDEPENDENT_AMBULATORY_CARE_PROVIDER_SITE_OTHER): Payer: Medicare HMO | Admitting: Internal Medicine

## 2021-09-30 ENCOUNTER — Ambulatory Visit: Payer: Medicare HMO | Admitting: Internal Medicine

## 2021-09-30 DIAGNOSIS — I69391 Dysphagia following cerebral infarction: Secondary | ICD-10-CM | POA: Diagnosis not present

## 2021-09-30 DIAGNOSIS — Z8673 Personal history of transient ischemic attack (TIA), and cerebral infarction without residual deficits: Secondary | ICD-10-CM

## 2021-09-30 DIAGNOSIS — K219 Gastro-esophageal reflux disease without esophagitis: Secondary | ICD-10-CM | POA: Diagnosis not present

## 2021-09-30 DIAGNOSIS — R69 Illness, unspecified: Secondary | ICD-10-CM | POA: Diagnosis not present

## 2021-09-30 DIAGNOSIS — E039 Hypothyroidism, unspecified: Secondary | ICD-10-CM

## 2021-09-30 DIAGNOSIS — Z8612 Personal history of poliomyelitis: Secondary | ICD-10-CM | POA: Diagnosis not present

## 2021-09-30 DIAGNOSIS — Z7984 Long term (current) use of oral hypoglycemic drugs: Secondary | ICD-10-CM | POA: Diagnosis not present

## 2021-09-30 DIAGNOSIS — Z993 Dependence on wheelchair: Secondary | ICD-10-CM | POA: Diagnosis not present

## 2021-09-30 DIAGNOSIS — M8000XD Age-related osteoporosis with current pathological fracture, unspecified site, subsequent encounter for fracture with routine healing: Secondary | ICD-10-CM

## 2021-09-30 DIAGNOSIS — Z853 Personal history of malignant neoplasm of breast: Secondary | ICD-10-CM | POA: Diagnosis not present

## 2021-09-30 DIAGNOSIS — M4850XD Collapsed vertebra, not elsewhere classified, site unspecified, subsequent encounter for fracture with routine healing: Secondary | ICD-10-CM | POA: Diagnosis not present

## 2021-09-30 DIAGNOSIS — E7800 Pure hypercholesterolemia, unspecified: Secondary | ICD-10-CM | POA: Diagnosis not present

## 2021-09-30 DIAGNOSIS — N1832 Chronic kidney disease, stage 3b: Secondary | ICD-10-CM

## 2021-09-30 DIAGNOSIS — N1831 Chronic kidney disease, stage 3a: Secondary | ICD-10-CM | POA: Diagnosis not present

## 2021-09-30 DIAGNOSIS — I10 Essential (primary) hypertension: Secondary | ICD-10-CM | POA: Diagnosis not present

## 2021-09-30 DIAGNOSIS — F419 Anxiety disorder, unspecified: Secondary | ICD-10-CM

## 2021-09-30 DIAGNOSIS — E1122 Type 2 diabetes mellitus with diabetic chronic kidney disease: Secondary | ICD-10-CM

## 2021-09-30 DIAGNOSIS — I69351 Hemiplegia and hemiparesis following cerebral infarction affecting right dominant side: Secondary | ICD-10-CM | POA: Diagnosis not present

## 2021-09-30 DIAGNOSIS — G8929 Other chronic pain: Secondary | ICD-10-CM | POA: Diagnosis not present

## 2021-09-30 DIAGNOSIS — Z7902 Long term (current) use of antithrombotics/antiplatelets: Secondary | ICD-10-CM | POA: Diagnosis not present

## 2021-09-30 DIAGNOSIS — I129 Hypertensive chronic kidney disease with stage 1 through stage 4 chronic kidney disease, or unspecified chronic kidney disease: Secondary | ICD-10-CM | POA: Diagnosis not present

## 2021-09-30 DIAGNOSIS — I69322 Dysarthria following cerebral infarction: Secondary | ICD-10-CM | POA: Diagnosis not present

## 2021-09-30 DIAGNOSIS — Z7982 Long term (current) use of aspirin: Secondary | ICD-10-CM | POA: Diagnosis not present

## 2021-09-30 MED ORDER — FREESTYLE LIBRE 3 SENSOR MISC: 5 refills | Status: DC

## 2021-09-30 NOTE — Progress Notes (Signed)
[name], female   DOB: July 23, 1940, 81 y.o.   MRN: [id number]   Virtual Visit via video Note  All issues noted in this document were discussed and addressed.  No physical exam was performed (except for noted visual exam findings with Video Visits).   I connected with Lib Ribeiro by a video enabled telemedicine application and verified that I am speaking with the correct person using two identifiers. Location patient: home Location provider: work  Persons participating in the virtual visit: patient, provider and pts daughter.    The limitations, risks, security and privacy concerns of performing an evaluation and management service by video and the availability of in person appointments have been discussed.  It has also  been discussed with the patient that there may be a patient responsible charge related to this service. The patient expressed understanding and agreed to proceed.   Reason for visit: hospital follow up  HPI: Hospitalized 09/19/21 - 09/21/21 - acute CVA.  Presented with slurred speech and weakness.  CT head and CTA - unremarkable. Neurology consulted - MRI - small acute infarct left caudate nucleus. Placed on aspirin and plavix.  TTE unremarkable.  PT/OT - advised SNF.  Pt declined. She was discharged home with PT/OT.  Discussed with her regarding the importance of continuing PT.  Family has seen some improvement.  She is able to lift her bottom.  Able to stand better.  Talking better.  Is eating.  Thickened liquids.  No pain.  No chest pain.  Breathing stable.  No increased cough or congestion.  Bowels ok.  Request rx for Libre 3.  Blood sugars 140-145.  Taking medication as directed.    ROS: See pertinent positives and negatives per HPI.  Past Medical History:  Diagnosis Date   Breast cancer ([place]) 2005   s/p chemotherapy and XRT   Depression    Diabetes ([place])    GERD (gastroesophageal reflux disease)    Hx of vertigo    Hypercholesterolemia    Hypertension     Hypothyroidism    Polio 1948   history   PONV (postoperative nausea and vomiting)     Past Surgical History:  Procedure Laterality Date   BREAST BIOPSY Left 2005   BREAST BIOPSY Left 10/25/2015   US guided biopsy   BREAST LUMPECTOMY Left 2005   EXCISION OF BREAST LESION Left 11/19/2015   Procedure: EXCISION OF BREAST LESION/ MASS;  Surgeon: Leonie Green, MD;  Location: [facility] ORS;  Service: General;  Laterality: Left;   EYE SURGERY     cataracts bilateral   TUBAL LIGATION      Family History  Problem Relation Age of Onset   Cancer Mother        breast   Breast cancer Mother 64   Stroke Father    Breast cancer Maternal Aunt     SOCIAL HX: reviewed.    Current Outpatient Medications:    acetaminophen (TYLENOL) 500 MG tablet, Take 2 tablets (1,000 mg total) by mouth every 8 (eight) hours., Disp: 30 tablet, Rfl: 0   aspirin EC 81 MG tablet, Take 1 tablet (81 mg total) by mouth every evening for 21 days., Disp: 21 tablet, Rfl: 0   atorvastatin (LIPITOR) 80 MG tablet, Take 1 tablet (80 mg total) by mouth daily., Disp: 30 tablet, Rfl: 1   citalopram (CELEXA) 10 MG tablet, Take 1 tablet by mouth once daily, Disp: 90 tablet, Rfl: 0   clopidogrel (PLAVIX) 75 MG tablet, Take  1 tablet (75 mg total) by mouth daily., Disp: 30 tablet, Rfl: 1   docusate sodium (COLACE) 100 MG capsule, Take 1 capsule (100 mg total) by mouth 2 (two) times daily., Disp: 10 capsule, Rfl: 0   levothyroxine (SYNTHROID) 125 MCG tablet, Take 1 tablet by mouth once daily, Disp: 90 tablet, Rfl: 0   metFORMIN (GLUCOPHAGE-XR) 500 MG 24 hr tablet, Take 500 mg by mouth daily with supper., Disp: , Rfl:    metoprolol succinate (TOPROL-XL) 25 MG 24 hr tablet, Take 1 tablet by mouth once daily (Patient taking differently: Take 25 mg by mouth every evening.), Disp: 90 tablet, Rfl: 0   pantoprazole (PROTONIX) 40 MG tablet, TAKE 1 TABLET BY MOUTH TWICE DAILY WITH A MEAL (Patient taking differently: Take 40 mg by mouth every  evening.), Disp: 180 tablet, Rfl: 0   venlafaxine XR (EFFEXOR-XR) 150 MG 24 hr capsule, Take 1 capsule by mouth once daily (Patient taking differently: 150 mg daily.), Disp: 90 capsule, Rfl: 0   Continuous Blood Gluc Sensor (FREESTYLE LIBRE 3 SENSOR) MISC, Place 1 sensor on the skin every 14 days. Use to check glucose continuously, Disp: 2 each, Rfl: 5  EXAM:  GENERAL: alert, oriented, appears well and in no acute distress  HEENT: atraumatic, conjunttiva clear, no obvious abnormalities on inspection of external nose and ears  NECK: normal movements of the head and neck  LUNGS: on inspection no signs of respiratory distress, breathing rate appears normal, no obvious gross SOB, gasping or wheezing  CV: no obvious cyanosis  PSYCH/NEURO: pleasant and cooperative, no obvious depression or anxiety, speech and thought processing grossly intact  ASSESSMENT AND PLAN:  Discussed the following assessment and plan:  Problem List Items Addressed This Visit     Anxiety    Continue effexor and citalopram.  Stable.       CKD (chronic kidney disease), stage IIIa    Avoid antiinflammatories.  Stay hydrated.  Follow.        Essential hypertension, benign    Blood pressure allowed to run a little high during her hospitalization.  Discussed the need to check her blood pressure now that she is at home and send in readings.  Follow pressure.  Follow metabolic panel.       GERD (gastroesophageal reflux disease)    Continue PPI.       History of breast cancer    Overdue mammogram.  Will allow her to recover from recent CVA.  Will need to schedule in future.       History of CVA (cerebrovascular accident)    Recently admitted as outlined.  Is doing some better.  Discussed need for home health PT/OT.  PT to come today.  Follow. Also discussed need for neurology f/u.       Hypercholesterolemia    On crestor.  Low cholesterol diet and exercise.  Follow lipid panel and liver function tests.         Hypothyroidism    On thyroid replacement.  Follow tsh.        Type II diabetes mellitus with renal manifestations (HCC)    Sugars as outlined.  rx for libre 3.  Follow sugars and send in readings.  Follow met b and a1c.  Overdue urine microalbumin/cr ratio.       Relevant Medications   Continuous Blood Gluc Sensor (FREESTYLE LIBRE 3 SENSOR) MISC   Osteoporosis (Chronic)    Continue calcium and vitamin d.  Will need further treatment.  Return in about 2 months (around 11/30/2021) for follow up appt (88mn).   I discussed the assessment and treatment plan with the patient. The patient was provided an opportunity to ask questions and all were answered. The patient agreed with the plan and demonstrated an understanding of the instructions.   The patient was advised to call back or seek an in-person evaluation if the symptoms worsen or if the condition fails to improve as anticipated.   CEinar Pheasant MD

## 2021-10-02 ENCOUNTER — Telehealth: Payer: Self-pay | Admitting: *Deleted

## 2021-10-02 DIAGNOSIS — I69391 Dysphagia following cerebral infarction: Secondary | ICD-10-CM | POA: Diagnosis not present

## 2021-10-02 DIAGNOSIS — R69 Illness, unspecified: Secondary | ICD-10-CM | POA: Diagnosis not present

## 2021-10-02 DIAGNOSIS — N1831 Chronic kidney disease, stage 3a: Secondary | ICD-10-CM | POA: Diagnosis not present

## 2021-10-02 DIAGNOSIS — Z993 Dependence on wheelchair: Secondary | ICD-10-CM | POA: Diagnosis not present

## 2021-10-02 DIAGNOSIS — Z7984 Long term (current) use of oral hypoglycemic drugs: Secondary | ICD-10-CM | POA: Diagnosis not present

## 2021-10-02 DIAGNOSIS — K219 Gastro-esophageal reflux disease without esophagitis: Secondary | ICD-10-CM | POA: Diagnosis not present

## 2021-10-02 DIAGNOSIS — I69351 Hemiplegia and hemiparesis following cerebral infarction affecting right dominant side: Secondary | ICD-10-CM | POA: Diagnosis not present

## 2021-10-02 DIAGNOSIS — E039 Hypothyroidism, unspecified: Secondary | ICD-10-CM | POA: Diagnosis not present

## 2021-10-02 DIAGNOSIS — I69322 Dysarthria following cerebral infarction: Secondary | ICD-10-CM | POA: Diagnosis not present

## 2021-10-02 DIAGNOSIS — I129 Hypertensive chronic kidney disease with stage 1 through stage 4 chronic kidney disease, or unspecified chronic kidney disease: Secondary | ICD-10-CM | POA: Diagnosis not present

## 2021-10-02 DIAGNOSIS — Z8612 Personal history of poliomyelitis: Secondary | ICD-10-CM | POA: Diagnosis not present

## 2021-10-02 DIAGNOSIS — E7800 Pure hypercholesterolemia, unspecified: Secondary | ICD-10-CM | POA: Diagnosis not present

## 2021-10-02 DIAGNOSIS — Z853 Personal history of malignant neoplasm of breast: Secondary | ICD-10-CM | POA: Diagnosis not present

## 2021-10-02 DIAGNOSIS — G8929 Other chronic pain: Secondary | ICD-10-CM | POA: Diagnosis not present

## 2021-10-02 DIAGNOSIS — Z7902 Long term (current) use of antithrombotics/antiplatelets: Secondary | ICD-10-CM | POA: Diagnosis not present

## 2021-10-02 DIAGNOSIS — Z7982 Long term (current) use of aspirin: Secondary | ICD-10-CM | POA: Diagnosis not present

## 2021-10-02 DIAGNOSIS — E1122 Type 2 diabetes mellitus with diabetic chronic kidney disease: Secondary | ICD-10-CM | POA: Diagnosis not present

## 2021-10-02 DIAGNOSIS — M4850XD Collapsed vertebra, not elsewhere classified, site unspecified, subsequent encounter for fracture with routine healing: Secondary | ICD-10-CM | POA: Diagnosis not present

## 2021-10-02 NOTE — Telephone Encounter (Signed)
Dub Mikes from [facility] called. She is requesting the orders for occupational Therapy to be faxed to [facility], fax number unknown.

## 2021-10-02 NOTE — Patient Outreach (Signed)
  Care Coordination [facility] Note Transition Care Management Follow-up Telephone Call Date of discharge and from where: [phone] [facility] How have you been since you were released from the hospital? Doing better. Getting up with walker now and assistance Any questions or concerns? Yes  Patient was evaluated for PT/OT, But only the PT is coming out. [facility] said an order for the OT had not been received. Only the PT order. Items Reviewed: Did the pt receive and understand the discharge instructions provided? Yes  Medications obtained and verified? Yes  Other? No  Any new allergies since your discharge? No  Dietary orders reviewed? Yes Do you have support at home? Yes   Home Care and Equipment/Supplies: Were home health services ordered? yes If so, what is the name of the agency? [facility]  Has the agency set up a time to come to the patient's home? Yes for PT but not OT Were any new equipment or medical supplies ordered?  Yes:   What is the name of the medical supply agency? [facility] Were you able to get the supplies/equipment? yes Do you have any questions related to the use of the equipment or supplies? No  Functional Questionnaire: (I = Independent and D = Dependent) ADLs: D  Bathing/Dressing- D  Meal Prep- D  Eating- I  Maintaining continence- D  Transferring/Ambulation- D  Managing Meds- D  Follow up appointments reviewed:  PCP Hospital f/u appt confirmed? Yes  Scheduled to see Dr Einar Pheasant [phone]  11:00 [facility] f/u appt confirmed?  Are transportation arrangements needed? No  If their condition worsens, is the pt aware to call PCP or go to the Emergency Dept.? Yes Was the patient provided with contact information for the PCP's office or ED? Yes Was to pt encouraged to call back with questions or concerns? Yes  SDOH assessments and interventions completed:   Yes  Care Coordination Interventions Activated:  Yes   Care Coordination Interventions:  RN  followed up with DR Nicki Reaper office to get order for OT ordered    Encounter Outcome:  Pt. Visit Completed    [place] [facility] [phone]

## 2021-10-03 DIAGNOSIS — E1122 Type 2 diabetes mellitus with diabetic chronic kidney disease: Secondary | ICD-10-CM | POA: Diagnosis not present

## 2021-10-03 DIAGNOSIS — I69351 Hemiplegia and hemiparesis following cerebral infarction affecting right dominant side: Secondary | ICD-10-CM | POA: Diagnosis not present

## 2021-10-03 DIAGNOSIS — Z7984 Long term (current) use of oral hypoglycemic drugs: Secondary | ICD-10-CM

## 2021-10-03 DIAGNOSIS — E039 Hypothyroidism, unspecified: Secondary | ICD-10-CM | POA: Diagnosis not present

## 2021-10-03 DIAGNOSIS — Z853 Personal history of malignant neoplasm of breast: Secondary | ICD-10-CM

## 2021-10-03 DIAGNOSIS — N1831 Chronic kidney disease, stage 3a: Secondary | ICD-10-CM | POA: Diagnosis not present

## 2021-10-03 DIAGNOSIS — I69391 Dysphagia following cerebral infarction: Secondary | ICD-10-CM | POA: Diagnosis not present

## 2021-10-03 DIAGNOSIS — Z8612 Personal history of poliomyelitis: Secondary | ICD-10-CM

## 2021-10-03 DIAGNOSIS — F419 Anxiety disorder, unspecified: Secondary | ICD-10-CM

## 2021-10-03 DIAGNOSIS — Z7902 Long term (current) use of antithrombotics/antiplatelets: Secondary | ICD-10-CM

## 2021-10-03 DIAGNOSIS — E7800 Pure hypercholesterolemia, unspecified: Secondary | ICD-10-CM

## 2021-10-03 DIAGNOSIS — M4850XD Collapsed vertebra, not elsewhere classified, site unspecified, subsequent encounter for fracture with routine healing: Secondary | ICD-10-CM | POA: Diagnosis not present

## 2021-10-03 DIAGNOSIS — I69322 Dysarthria following cerebral infarction: Secondary | ICD-10-CM | POA: Diagnosis not present

## 2021-10-03 DIAGNOSIS — G8929 Other chronic pain: Secondary | ICD-10-CM | POA: Diagnosis not present

## 2021-10-03 DIAGNOSIS — K219 Gastro-esophageal reflux disease without esophagitis: Secondary | ICD-10-CM | POA: Diagnosis not present

## 2021-10-03 DIAGNOSIS — Z7982 Long term (current) use of aspirin: Secondary | ICD-10-CM

## 2021-10-03 DIAGNOSIS — R69 Illness, unspecified: Secondary | ICD-10-CM | POA: Diagnosis not present

## 2021-10-03 DIAGNOSIS — F320 Major depressive disorder, single episode, mild: Secondary | ICD-10-CM

## 2021-10-03 DIAGNOSIS — I129 Hypertensive chronic kidney disease with stage 1 through stage 4 chronic kidney disease, or unspecified chronic kidney disease: Secondary | ICD-10-CM | POA: Diagnosis not present

## 2021-10-03 DIAGNOSIS — Z993 Dependence on wheelchair: Secondary | ICD-10-CM

## 2021-10-03 NOTE — Telephone Encounter (Signed)
Dr. Nicki Reaper the order is needed for OT  because the hospital originally put the order in for the patient  at discharge but they missed the initial appointment and now they are needing an order from you to do OT.  I put the ED office note in the medical record folder for you to review.  Wade Sigala,cma

## 2021-10-03 NOTE — Telephone Encounter (Signed)
I called and spoke with Joyce Maynard and gave verbal orders for OT at [facility]. Cori Henningsen,cma

## 2021-10-03 NOTE — Telephone Encounter (Signed)
Please call and notify them that I am ok with OT.  Ok to give verbal order

## 2021-10-03 NOTE — Telephone Encounter (Signed)
I received and signed home health certification, but I have not received OT orders that I see.  Will need to refax if not in already faxed pile.

## 2021-10-05 ENCOUNTER — Encounter: Payer: Self-pay | Admitting: Internal Medicine

## 2021-10-05 NOTE — Assessment & Plan Note (Signed)
Recently admitted as outlined.  Is doing some better.  Discussed need for home health PT/OT.  PT to come today.  Follow. Also discussed need for neurology f/u.

## 2021-10-05 NOTE — Assessment & Plan Note (Signed)
On thyroid replacement.  Follow tsh.  

## 2021-10-05 NOTE — Assessment & Plan Note (Signed)
Blood pressure allowed to run a little high during her hospitalization.  Discussed the need to check her blood pressure now that she is at home and send in readings.  Follow pressure.  Follow metabolic panel.

## 2021-10-05 NOTE — Assessment & Plan Note (Signed)
Sugars as outlined.  rx for libre 3.  Follow sugars and send in readings.  Follow met b and a1c.  Overdue urine microalbumin/cr ratio.

## 2021-10-05 NOTE — Assessment & Plan Note (Signed)
Continue effexor and citalopram.  Stable.

## 2021-10-05 NOTE — Assessment & Plan Note (Signed)
On crestor.  Low cholesterol diet and exercise.  Follow lipid panel and liver function tests.   

## 2021-10-05 NOTE — Assessment & Plan Note (Signed)
Continue PPI ?

## 2021-10-05 NOTE — Assessment & Plan Note (Signed)
Avoid antiinflammatories.  Stay hydrated.  Follow.

## 2021-10-05 NOTE — Assessment & Plan Note (Signed)
Continue calcium and vitamin d.  Will need further treatment.

## 2021-10-05 NOTE — Assessment & Plan Note (Signed)
Overdue mammogram.  Will allow her to recover from recent CVA.  Will need to schedule in future.

## 2021-10-07 DIAGNOSIS — K219 Gastro-esophageal reflux disease without esophagitis: Secondary | ICD-10-CM | POA: Diagnosis not present

## 2021-10-07 DIAGNOSIS — G8929 Other chronic pain: Secondary | ICD-10-CM | POA: Diagnosis not present

## 2021-10-07 DIAGNOSIS — Z993 Dependence on wheelchair: Secondary | ICD-10-CM | POA: Diagnosis not present

## 2021-10-07 DIAGNOSIS — N1831 Chronic kidney disease, stage 3a: Secondary | ICD-10-CM | POA: Diagnosis not present

## 2021-10-07 DIAGNOSIS — I69351 Hemiplegia and hemiparesis following cerebral infarction affecting right dominant side: Secondary | ICD-10-CM | POA: Diagnosis not present

## 2021-10-07 DIAGNOSIS — M4850XD Collapsed vertebra, not elsewhere classified, site unspecified, subsequent encounter for fracture with routine healing: Secondary | ICD-10-CM | POA: Diagnosis not present

## 2021-10-07 DIAGNOSIS — E7800 Pure hypercholesterolemia, unspecified: Secondary | ICD-10-CM | POA: Diagnosis not present

## 2021-10-07 DIAGNOSIS — I69391 Dysphagia following cerebral infarction: Secondary | ICD-10-CM | POA: Diagnosis not present

## 2021-10-07 DIAGNOSIS — I129 Hypertensive chronic kidney disease with stage 1 through stage 4 chronic kidney disease, or unspecified chronic kidney disease: Secondary | ICD-10-CM | POA: Diagnosis not present

## 2021-10-07 DIAGNOSIS — Z7902 Long term (current) use of antithrombotics/antiplatelets: Secondary | ICD-10-CM | POA: Diagnosis not present

## 2021-10-07 DIAGNOSIS — E1122 Type 2 diabetes mellitus with diabetic chronic kidney disease: Secondary | ICD-10-CM | POA: Diagnosis not present

## 2021-10-07 DIAGNOSIS — I69322 Dysarthria following cerebral infarction: Secondary | ICD-10-CM | POA: Diagnosis not present

## 2021-10-07 DIAGNOSIS — Z853 Personal history of malignant neoplasm of breast: Secondary | ICD-10-CM | POA: Diagnosis not present

## 2021-10-07 DIAGNOSIS — Z7982 Long term (current) use of aspirin: Secondary | ICD-10-CM | POA: Diagnosis not present

## 2021-10-07 DIAGNOSIS — Z8612 Personal history of poliomyelitis: Secondary | ICD-10-CM | POA: Diagnosis not present

## 2021-10-07 DIAGNOSIS — E039 Hypothyroidism, unspecified: Secondary | ICD-10-CM | POA: Diagnosis not present

## 2021-10-07 DIAGNOSIS — Z7984 Long term (current) use of oral hypoglycemic drugs: Secondary | ICD-10-CM | POA: Diagnosis not present

## 2021-10-07 DIAGNOSIS — R69 Illness, unspecified: Secondary | ICD-10-CM | POA: Diagnosis not present

## 2021-10-08 DIAGNOSIS — E1122 Type 2 diabetes mellitus with diabetic chronic kidney disease: Secondary | ICD-10-CM | POA: Diagnosis not present

## 2021-10-08 DIAGNOSIS — Z7982 Long term (current) use of aspirin: Secondary | ICD-10-CM | POA: Diagnosis not present

## 2021-10-08 DIAGNOSIS — K219 Gastro-esophageal reflux disease without esophagitis: Secondary | ICD-10-CM | POA: Diagnosis not present

## 2021-10-08 DIAGNOSIS — G8929 Other chronic pain: Secondary | ICD-10-CM | POA: Diagnosis not present

## 2021-10-08 DIAGNOSIS — Z7984 Long term (current) use of oral hypoglycemic drugs: Secondary | ICD-10-CM | POA: Diagnosis not present

## 2021-10-08 DIAGNOSIS — E039 Hypothyroidism, unspecified: Secondary | ICD-10-CM | POA: Diagnosis not present

## 2021-10-08 DIAGNOSIS — R69 Illness, unspecified: Secondary | ICD-10-CM | POA: Diagnosis not present

## 2021-10-08 DIAGNOSIS — Z7902 Long term (current) use of antithrombotics/antiplatelets: Secondary | ICD-10-CM | POA: Diagnosis not present

## 2021-10-08 DIAGNOSIS — I129 Hypertensive chronic kidney disease with stage 1 through stage 4 chronic kidney disease, or unspecified chronic kidney disease: Secondary | ICD-10-CM | POA: Diagnosis not present

## 2021-10-08 DIAGNOSIS — M4850XD Collapsed vertebra, not elsewhere classified, site unspecified, subsequent encounter for fracture with routine healing: Secondary | ICD-10-CM | POA: Diagnosis not present

## 2021-10-08 DIAGNOSIS — I69391 Dysphagia following cerebral infarction: Secondary | ICD-10-CM | POA: Diagnosis not present

## 2021-10-08 DIAGNOSIS — Z8612 Personal history of poliomyelitis: Secondary | ICD-10-CM | POA: Diagnosis not present

## 2021-10-08 DIAGNOSIS — E7800 Pure hypercholesterolemia, unspecified: Secondary | ICD-10-CM | POA: Diagnosis not present

## 2021-10-08 DIAGNOSIS — Z853 Personal history of malignant neoplasm of breast: Secondary | ICD-10-CM | POA: Diagnosis not present

## 2021-10-08 DIAGNOSIS — I69322 Dysarthria following cerebral infarction: Secondary | ICD-10-CM | POA: Diagnosis not present

## 2021-10-08 DIAGNOSIS — N1831 Chronic kidney disease, stage 3a: Secondary | ICD-10-CM | POA: Diagnosis not present

## 2021-10-08 DIAGNOSIS — Z993 Dependence on wheelchair: Secondary | ICD-10-CM | POA: Diagnosis not present

## 2021-10-08 DIAGNOSIS — I69351 Hemiplegia and hemiparesis following cerebral infarction affecting right dominant side: Secondary | ICD-10-CM | POA: Diagnosis not present

## 2021-10-11 ENCOUNTER — Other Ambulatory Visit: Payer: Self-pay | Admitting: Internal Medicine

## 2021-10-14 DIAGNOSIS — G8929 Other chronic pain: Secondary | ICD-10-CM | POA: Diagnosis not present

## 2021-10-14 DIAGNOSIS — I129 Hypertensive chronic kidney disease with stage 1 through stage 4 chronic kidney disease, or unspecified chronic kidney disease: Secondary | ICD-10-CM | POA: Diagnosis not present

## 2021-10-14 DIAGNOSIS — E7800 Pure hypercholesterolemia, unspecified: Secondary | ICD-10-CM | POA: Diagnosis not present

## 2021-10-14 DIAGNOSIS — I69391 Dysphagia following cerebral infarction: Secondary | ICD-10-CM | POA: Diagnosis not present

## 2021-10-14 DIAGNOSIS — N1831 Chronic kidney disease, stage 3a: Secondary | ICD-10-CM | POA: Diagnosis not present

## 2021-10-14 DIAGNOSIS — Z853 Personal history of malignant neoplasm of breast: Secondary | ICD-10-CM | POA: Diagnosis not present

## 2021-10-14 DIAGNOSIS — I69351 Hemiplegia and hemiparesis following cerebral infarction affecting right dominant side: Secondary | ICD-10-CM | POA: Diagnosis not present

## 2021-10-14 DIAGNOSIS — Z993 Dependence on wheelchair: Secondary | ICD-10-CM | POA: Diagnosis not present

## 2021-10-14 DIAGNOSIS — Z7984 Long term (current) use of oral hypoglycemic drugs: Secondary | ICD-10-CM | POA: Diagnosis not present

## 2021-10-14 DIAGNOSIS — K219 Gastro-esophageal reflux disease without esophagitis: Secondary | ICD-10-CM | POA: Diagnosis not present

## 2021-10-14 DIAGNOSIS — R69 Illness, unspecified: Secondary | ICD-10-CM | POA: Diagnosis not present

## 2021-10-14 DIAGNOSIS — Z8612 Personal history of poliomyelitis: Secondary | ICD-10-CM | POA: Diagnosis not present

## 2021-10-14 DIAGNOSIS — M4850XD Collapsed vertebra, not elsewhere classified, site unspecified, subsequent encounter for fracture with routine healing: Secondary | ICD-10-CM | POA: Diagnosis not present

## 2021-10-14 DIAGNOSIS — I69322 Dysarthria following cerebral infarction: Secondary | ICD-10-CM | POA: Diagnosis not present

## 2021-10-14 DIAGNOSIS — E039 Hypothyroidism, unspecified: Secondary | ICD-10-CM | POA: Diagnosis not present

## 2021-10-14 DIAGNOSIS — Z7982 Long term (current) use of aspirin: Secondary | ICD-10-CM | POA: Diagnosis not present

## 2021-10-14 DIAGNOSIS — Z7902 Long term (current) use of antithrombotics/antiplatelets: Secondary | ICD-10-CM | POA: Diagnosis not present

## 2021-10-14 DIAGNOSIS — E1122 Type 2 diabetes mellitus with diabetic chronic kidney disease: Secondary | ICD-10-CM | POA: Diagnosis not present

## 2021-10-15 DIAGNOSIS — G8929 Other chronic pain: Secondary | ICD-10-CM | POA: Diagnosis not present

## 2021-10-15 DIAGNOSIS — E1122 Type 2 diabetes mellitus with diabetic chronic kidney disease: Secondary | ICD-10-CM | POA: Diagnosis not present

## 2021-10-15 DIAGNOSIS — R69 Illness, unspecified: Secondary | ICD-10-CM | POA: Diagnosis not present

## 2021-10-15 DIAGNOSIS — Z8612 Personal history of poliomyelitis: Secondary | ICD-10-CM | POA: Diagnosis not present

## 2021-10-15 DIAGNOSIS — Z7902 Long term (current) use of antithrombotics/antiplatelets: Secondary | ICD-10-CM | POA: Diagnosis not present

## 2021-10-15 DIAGNOSIS — M4850XD Collapsed vertebra, not elsewhere classified, site unspecified, subsequent encounter for fracture with routine healing: Secondary | ICD-10-CM | POA: Diagnosis not present

## 2021-10-15 DIAGNOSIS — I69322 Dysarthria following cerebral infarction: Secondary | ICD-10-CM | POA: Diagnosis not present

## 2021-10-15 DIAGNOSIS — Z993 Dependence on wheelchair: Secondary | ICD-10-CM | POA: Diagnosis not present

## 2021-10-15 DIAGNOSIS — I69391 Dysphagia following cerebral infarction: Secondary | ICD-10-CM | POA: Diagnosis not present

## 2021-10-15 DIAGNOSIS — Z7982 Long term (current) use of aspirin: Secondary | ICD-10-CM | POA: Diagnosis not present

## 2021-10-15 DIAGNOSIS — I129 Hypertensive chronic kidney disease with stage 1 through stage 4 chronic kidney disease, or unspecified chronic kidney disease: Secondary | ICD-10-CM | POA: Diagnosis not present

## 2021-10-15 DIAGNOSIS — E039 Hypothyroidism, unspecified: Secondary | ICD-10-CM | POA: Diagnosis not present

## 2021-10-15 DIAGNOSIS — I69351 Hemiplegia and hemiparesis following cerebral infarction affecting right dominant side: Secondary | ICD-10-CM | POA: Diagnosis not present

## 2021-10-15 DIAGNOSIS — K219 Gastro-esophageal reflux disease without esophagitis: Secondary | ICD-10-CM | POA: Diagnosis not present

## 2021-10-15 DIAGNOSIS — Z853 Personal history of malignant neoplasm of breast: Secondary | ICD-10-CM | POA: Diagnosis not present

## 2021-10-15 DIAGNOSIS — E7800 Pure hypercholesterolemia, unspecified: Secondary | ICD-10-CM | POA: Diagnosis not present

## 2021-10-15 DIAGNOSIS — Z7984 Long term (current) use of oral hypoglycemic drugs: Secondary | ICD-10-CM | POA: Diagnosis not present

## 2021-10-15 DIAGNOSIS — N1831 Chronic kidney disease, stage 3a: Secondary | ICD-10-CM | POA: Diagnosis not present

## 2021-10-16 ENCOUNTER — Other Ambulatory Visit: Payer: Self-pay | Admitting: Internal Medicine

## 2021-10-16 DIAGNOSIS — I69391 Dysphagia following cerebral infarction: Secondary | ICD-10-CM | POA: Diagnosis not present

## 2021-10-16 DIAGNOSIS — M4850XD Collapsed vertebra, not elsewhere classified, site unspecified, subsequent encounter for fracture with routine healing: Secondary | ICD-10-CM | POA: Diagnosis not present

## 2021-10-16 DIAGNOSIS — Z7982 Long term (current) use of aspirin: Secondary | ICD-10-CM | POA: Diagnosis not present

## 2021-10-16 DIAGNOSIS — E7800 Pure hypercholesterolemia, unspecified: Secondary | ICD-10-CM | POA: Diagnosis not present

## 2021-10-16 DIAGNOSIS — Z993 Dependence on wheelchair: Secondary | ICD-10-CM | POA: Diagnosis not present

## 2021-10-16 DIAGNOSIS — I69351 Hemiplegia and hemiparesis following cerebral infarction affecting right dominant side: Secondary | ICD-10-CM | POA: Diagnosis not present

## 2021-10-16 DIAGNOSIS — E1122 Type 2 diabetes mellitus with diabetic chronic kidney disease: Secondary | ICD-10-CM | POA: Diagnosis not present

## 2021-10-16 DIAGNOSIS — I129 Hypertensive chronic kidney disease with stage 1 through stage 4 chronic kidney disease, or unspecified chronic kidney disease: Secondary | ICD-10-CM | POA: Diagnosis not present

## 2021-10-16 DIAGNOSIS — Z7902 Long term (current) use of antithrombotics/antiplatelets: Secondary | ICD-10-CM | POA: Diagnosis not present

## 2021-10-16 DIAGNOSIS — K219 Gastro-esophageal reflux disease without esophagitis: Secondary | ICD-10-CM | POA: Diagnosis not present

## 2021-10-16 DIAGNOSIS — Z853 Personal history of malignant neoplasm of breast: Secondary | ICD-10-CM | POA: Diagnosis not present

## 2021-10-16 DIAGNOSIS — Z7984 Long term (current) use of oral hypoglycemic drugs: Secondary | ICD-10-CM | POA: Diagnosis not present

## 2021-10-16 DIAGNOSIS — I69322 Dysarthria following cerebral infarction: Secondary | ICD-10-CM | POA: Diagnosis not present

## 2021-10-16 DIAGNOSIS — E039 Hypothyroidism, unspecified: Secondary | ICD-10-CM | POA: Diagnosis not present

## 2021-10-16 DIAGNOSIS — G8929 Other chronic pain: Secondary | ICD-10-CM | POA: Diagnosis not present

## 2021-10-16 DIAGNOSIS — R69 Illness, unspecified: Secondary | ICD-10-CM | POA: Diagnosis not present

## 2021-10-16 DIAGNOSIS — Z8612 Personal history of poliomyelitis: Secondary | ICD-10-CM | POA: Diagnosis not present

## 2021-10-16 DIAGNOSIS — N1831 Chronic kidney disease, stage 3a: Secondary | ICD-10-CM | POA: Diagnosis not present

## 2021-10-21 DIAGNOSIS — I639 Cerebral infarction, unspecified: Secondary | ICD-10-CM | POA: Diagnosis not present

## 2021-10-21 DIAGNOSIS — M4850XA Collapsed vertebra, not elsewhere classified, site unspecified, initial encounter for fracture: Secondary | ICD-10-CM | POA: Diagnosis not present

## 2021-10-22 DIAGNOSIS — Z853 Personal history of malignant neoplasm of breast: Secondary | ICD-10-CM | POA: Diagnosis not present

## 2021-10-22 DIAGNOSIS — Z7984 Long term (current) use of oral hypoglycemic drugs: Secondary | ICD-10-CM | POA: Diagnosis not present

## 2021-10-22 DIAGNOSIS — E1122 Type 2 diabetes mellitus with diabetic chronic kidney disease: Secondary | ICD-10-CM | POA: Diagnosis not present

## 2021-10-22 DIAGNOSIS — Z993 Dependence on wheelchair: Secondary | ICD-10-CM | POA: Diagnosis not present

## 2021-10-22 DIAGNOSIS — I69351 Hemiplegia and hemiparesis following cerebral infarction affecting right dominant side: Secondary | ICD-10-CM | POA: Diagnosis not present

## 2021-10-22 DIAGNOSIS — Z7902 Long term (current) use of antithrombotics/antiplatelets: Secondary | ICD-10-CM | POA: Diagnosis not present

## 2021-10-22 DIAGNOSIS — R69 Illness, unspecified: Secondary | ICD-10-CM | POA: Diagnosis not present

## 2021-10-22 DIAGNOSIS — I129 Hypertensive chronic kidney disease with stage 1 through stage 4 chronic kidney disease, or unspecified chronic kidney disease: Secondary | ICD-10-CM | POA: Diagnosis not present

## 2021-10-22 DIAGNOSIS — E7800 Pure hypercholesterolemia, unspecified: Secondary | ICD-10-CM | POA: Diagnosis not present

## 2021-10-22 DIAGNOSIS — I69322 Dysarthria following cerebral infarction: Secondary | ICD-10-CM | POA: Diagnosis not present

## 2021-10-22 DIAGNOSIS — M4850XD Collapsed vertebra, not elsewhere classified, site unspecified, subsequent encounter for fracture with routine healing: Secondary | ICD-10-CM | POA: Diagnosis not present

## 2021-10-22 DIAGNOSIS — G8929 Other chronic pain: Secondary | ICD-10-CM | POA: Diagnosis not present

## 2021-10-22 DIAGNOSIS — N1831 Chronic kidney disease, stage 3a: Secondary | ICD-10-CM | POA: Diagnosis not present

## 2021-10-22 DIAGNOSIS — K219 Gastro-esophageal reflux disease without esophagitis: Secondary | ICD-10-CM | POA: Diagnosis not present

## 2021-10-22 DIAGNOSIS — E039 Hypothyroidism, unspecified: Secondary | ICD-10-CM | POA: Diagnosis not present

## 2021-10-22 DIAGNOSIS — Z7982 Long term (current) use of aspirin: Secondary | ICD-10-CM | POA: Diagnosis not present

## 2021-10-22 DIAGNOSIS — Z8612 Personal history of poliomyelitis: Secondary | ICD-10-CM | POA: Diagnosis not present

## 2021-10-22 DIAGNOSIS — I69391 Dysphagia following cerebral infarction: Secondary | ICD-10-CM | POA: Diagnosis not present

## 2021-10-24 DIAGNOSIS — I69322 Dysarthria following cerebral infarction: Secondary | ICD-10-CM | POA: Diagnosis not present

## 2021-10-24 DIAGNOSIS — Z7902 Long term (current) use of antithrombotics/antiplatelets: Secondary | ICD-10-CM | POA: Diagnosis not present

## 2021-10-24 DIAGNOSIS — I69391 Dysphagia following cerebral infarction: Secondary | ICD-10-CM | POA: Diagnosis not present

## 2021-10-24 DIAGNOSIS — Z853 Personal history of malignant neoplasm of breast: Secondary | ICD-10-CM | POA: Diagnosis not present

## 2021-10-24 DIAGNOSIS — K219 Gastro-esophageal reflux disease without esophagitis: Secondary | ICD-10-CM | POA: Diagnosis not present

## 2021-10-24 DIAGNOSIS — Z7982 Long term (current) use of aspirin: Secondary | ICD-10-CM | POA: Diagnosis not present

## 2021-10-24 DIAGNOSIS — I69351 Hemiplegia and hemiparesis following cerebral infarction affecting right dominant side: Secondary | ICD-10-CM | POA: Diagnosis not present

## 2021-10-24 DIAGNOSIS — G8929 Other chronic pain: Secondary | ICD-10-CM | POA: Diagnosis not present

## 2021-10-24 DIAGNOSIS — M4850XD Collapsed vertebra, not elsewhere classified, site unspecified, subsequent encounter for fracture with routine healing: Secondary | ICD-10-CM | POA: Diagnosis not present

## 2021-10-24 DIAGNOSIS — R69 Illness, unspecified: Secondary | ICD-10-CM | POA: Diagnosis not present

## 2021-10-24 DIAGNOSIS — E039 Hypothyroidism, unspecified: Secondary | ICD-10-CM | POA: Diagnosis not present

## 2021-10-24 DIAGNOSIS — Z993 Dependence on wheelchair: Secondary | ICD-10-CM | POA: Diagnosis not present

## 2021-10-24 DIAGNOSIS — Z7984 Long term (current) use of oral hypoglycemic drugs: Secondary | ICD-10-CM | POA: Diagnosis not present

## 2021-10-24 DIAGNOSIS — I129 Hypertensive chronic kidney disease with stage 1 through stage 4 chronic kidney disease, or unspecified chronic kidney disease: Secondary | ICD-10-CM | POA: Diagnosis not present

## 2021-10-24 DIAGNOSIS — N1831 Chronic kidney disease, stage 3a: Secondary | ICD-10-CM | POA: Diagnosis not present

## 2021-10-24 DIAGNOSIS — Z8612 Personal history of poliomyelitis: Secondary | ICD-10-CM | POA: Diagnosis not present

## 2021-10-24 DIAGNOSIS — E7800 Pure hypercholesterolemia, unspecified: Secondary | ICD-10-CM | POA: Diagnosis not present

## 2021-10-24 DIAGNOSIS — E1122 Type 2 diabetes mellitus with diabetic chronic kidney disease: Secondary | ICD-10-CM | POA: Diagnosis not present

## 2021-10-28 DIAGNOSIS — Z993 Dependence on wheelchair: Secondary | ICD-10-CM | POA: Diagnosis not present

## 2021-10-28 DIAGNOSIS — E1122 Type 2 diabetes mellitus with diabetic chronic kidney disease: Secondary | ICD-10-CM | POA: Diagnosis not present

## 2021-10-28 DIAGNOSIS — I69322 Dysarthria following cerebral infarction: Secondary | ICD-10-CM | POA: Diagnosis not present

## 2021-10-28 DIAGNOSIS — K219 Gastro-esophageal reflux disease without esophagitis: Secondary | ICD-10-CM | POA: Diagnosis not present

## 2021-10-28 DIAGNOSIS — I69391 Dysphagia following cerebral infarction: Secondary | ICD-10-CM | POA: Diagnosis not present

## 2021-10-28 DIAGNOSIS — Z8612 Personal history of poliomyelitis: Secondary | ICD-10-CM | POA: Diagnosis not present

## 2021-10-28 DIAGNOSIS — M4850XD Collapsed vertebra, not elsewhere classified, site unspecified, subsequent encounter for fracture with routine healing: Secondary | ICD-10-CM | POA: Diagnosis not present

## 2021-10-28 DIAGNOSIS — Z7982 Long term (current) use of aspirin: Secondary | ICD-10-CM | POA: Diagnosis not present

## 2021-10-28 DIAGNOSIS — I69351 Hemiplegia and hemiparesis following cerebral infarction affecting right dominant side: Secondary | ICD-10-CM | POA: Diagnosis not present

## 2021-10-28 DIAGNOSIS — N1831 Chronic kidney disease, stage 3a: Secondary | ICD-10-CM | POA: Diagnosis not present

## 2021-10-28 DIAGNOSIS — R69 Illness, unspecified: Secondary | ICD-10-CM | POA: Diagnosis not present

## 2021-10-28 DIAGNOSIS — Z7984 Long term (current) use of oral hypoglycemic drugs: Secondary | ICD-10-CM | POA: Diagnosis not present

## 2021-10-28 DIAGNOSIS — Z853 Personal history of malignant neoplasm of breast: Secondary | ICD-10-CM | POA: Diagnosis not present

## 2021-10-28 DIAGNOSIS — E039 Hypothyroidism, unspecified: Secondary | ICD-10-CM | POA: Diagnosis not present

## 2021-10-28 DIAGNOSIS — G8929 Other chronic pain: Secondary | ICD-10-CM | POA: Diagnosis not present

## 2021-10-28 DIAGNOSIS — Z7902 Long term (current) use of antithrombotics/antiplatelets: Secondary | ICD-10-CM | POA: Diagnosis not present

## 2021-10-28 DIAGNOSIS — E7800 Pure hypercholesterolemia, unspecified: Secondary | ICD-10-CM | POA: Diagnosis not present

## 2021-10-28 DIAGNOSIS — I129 Hypertensive chronic kidney disease with stage 1 through stage 4 chronic kidney disease, or unspecified chronic kidney disease: Secondary | ICD-10-CM | POA: Diagnosis not present

## 2021-10-29 DIAGNOSIS — G8929 Other chronic pain: Secondary | ICD-10-CM | POA: Diagnosis not present

## 2021-10-29 DIAGNOSIS — E1122 Type 2 diabetes mellitus with diabetic chronic kidney disease: Secondary | ICD-10-CM | POA: Diagnosis not present

## 2021-10-29 DIAGNOSIS — Z7902 Long term (current) use of antithrombotics/antiplatelets: Secondary | ICD-10-CM | POA: Diagnosis not present

## 2021-10-29 DIAGNOSIS — M4850XD Collapsed vertebra, not elsewhere classified, site unspecified, subsequent encounter for fracture with routine healing: Secondary | ICD-10-CM | POA: Diagnosis not present

## 2021-10-29 DIAGNOSIS — Z853 Personal history of malignant neoplasm of breast: Secondary | ICD-10-CM | POA: Diagnosis not present

## 2021-10-29 DIAGNOSIS — I69351 Hemiplegia and hemiparesis following cerebral infarction affecting right dominant side: Secondary | ICD-10-CM | POA: Diagnosis not present

## 2021-10-29 DIAGNOSIS — E7800 Pure hypercholesterolemia, unspecified: Secondary | ICD-10-CM | POA: Diagnosis not present

## 2021-10-29 DIAGNOSIS — Z8612 Personal history of poliomyelitis: Secondary | ICD-10-CM | POA: Diagnosis not present

## 2021-10-29 DIAGNOSIS — Z7984 Long term (current) use of oral hypoglycemic drugs: Secondary | ICD-10-CM | POA: Diagnosis not present

## 2021-10-29 DIAGNOSIS — I69391 Dysphagia following cerebral infarction: Secondary | ICD-10-CM | POA: Diagnosis not present

## 2021-10-29 DIAGNOSIS — R69 Illness, unspecified: Secondary | ICD-10-CM | POA: Diagnosis not present

## 2021-10-29 DIAGNOSIS — I129 Hypertensive chronic kidney disease with stage 1 through stage 4 chronic kidney disease, or unspecified chronic kidney disease: Secondary | ICD-10-CM | POA: Diagnosis not present

## 2021-10-29 DIAGNOSIS — E039 Hypothyroidism, unspecified: Secondary | ICD-10-CM | POA: Diagnosis not present

## 2021-10-29 DIAGNOSIS — K219 Gastro-esophageal reflux disease without esophagitis: Secondary | ICD-10-CM | POA: Diagnosis not present

## 2021-10-29 DIAGNOSIS — N1831 Chronic kidney disease, stage 3a: Secondary | ICD-10-CM | POA: Diagnosis not present

## 2021-10-29 DIAGNOSIS — Z7982 Long term (current) use of aspirin: Secondary | ICD-10-CM | POA: Diagnosis not present

## 2021-10-29 DIAGNOSIS — I69322 Dysarthria following cerebral infarction: Secondary | ICD-10-CM | POA: Diagnosis not present

## 2021-10-29 DIAGNOSIS — Z993 Dependence on wheelchair: Secondary | ICD-10-CM | POA: Diagnosis not present

## 2021-10-30 DIAGNOSIS — I69351 Hemiplegia and hemiparesis following cerebral infarction affecting right dominant side: Secondary | ICD-10-CM | POA: Diagnosis not present

## 2021-10-30 DIAGNOSIS — M4850XD Collapsed vertebra, not elsewhere classified, site unspecified, subsequent encounter for fracture with routine healing: Secondary | ICD-10-CM | POA: Diagnosis not present

## 2021-10-30 DIAGNOSIS — E039 Hypothyroidism, unspecified: Secondary | ICD-10-CM | POA: Diagnosis not present

## 2021-10-30 DIAGNOSIS — E7800 Pure hypercholesterolemia, unspecified: Secondary | ICD-10-CM | POA: Diagnosis not present

## 2021-10-30 DIAGNOSIS — Z7984 Long term (current) use of oral hypoglycemic drugs: Secondary | ICD-10-CM | POA: Diagnosis not present

## 2021-10-30 DIAGNOSIS — Z8612 Personal history of poliomyelitis: Secondary | ICD-10-CM | POA: Diagnosis not present

## 2021-10-30 DIAGNOSIS — R69 Illness, unspecified: Secondary | ICD-10-CM | POA: Diagnosis not present

## 2021-10-30 DIAGNOSIS — Z993 Dependence on wheelchair: Secondary | ICD-10-CM | POA: Diagnosis not present

## 2021-10-30 DIAGNOSIS — G8929 Other chronic pain: Secondary | ICD-10-CM | POA: Diagnosis not present

## 2021-10-30 DIAGNOSIS — E1122 Type 2 diabetes mellitus with diabetic chronic kidney disease: Secondary | ICD-10-CM | POA: Diagnosis not present

## 2021-10-30 DIAGNOSIS — Z7902 Long term (current) use of antithrombotics/antiplatelets: Secondary | ICD-10-CM | POA: Diagnosis not present

## 2021-10-30 DIAGNOSIS — I129 Hypertensive chronic kidney disease with stage 1 through stage 4 chronic kidney disease, or unspecified chronic kidney disease: Secondary | ICD-10-CM | POA: Diagnosis not present

## 2021-10-30 DIAGNOSIS — Z853 Personal history of malignant neoplasm of breast: Secondary | ICD-10-CM | POA: Diagnosis not present

## 2021-10-30 DIAGNOSIS — I69322 Dysarthria following cerebral infarction: Secondary | ICD-10-CM | POA: Diagnosis not present

## 2021-10-30 DIAGNOSIS — Z7982 Long term (current) use of aspirin: Secondary | ICD-10-CM | POA: Diagnosis not present

## 2021-10-30 DIAGNOSIS — N1831 Chronic kidney disease, stage 3a: Secondary | ICD-10-CM | POA: Diagnosis not present

## 2021-10-30 DIAGNOSIS — I69391 Dysphagia following cerebral infarction: Secondary | ICD-10-CM | POA: Diagnosis not present

## 2021-10-30 DIAGNOSIS — K219 Gastro-esophageal reflux disease without esophagitis: Secondary | ICD-10-CM | POA: Diagnosis not present

## 2021-10-31 DIAGNOSIS — E1122 Type 2 diabetes mellitus with diabetic chronic kidney disease: Secondary | ICD-10-CM | POA: Diagnosis not present

## 2021-10-31 DIAGNOSIS — E7800 Pure hypercholesterolemia, unspecified: Secondary | ICD-10-CM | POA: Diagnosis not present

## 2021-10-31 DIAGNOSIS — E039 Hypothyroidism, unspecified: Secondary | ICD-10-CM | POA: Diagnosis not present

## 2021-10-31 DIAGNOSIS — I129 Hypertensive chronic kidney disease with stage 1 through stage 4 chronic kidney disease, or unspecified chronic kidney disease: Secondary | ICD-10-CM | POA: Diagnosis not present

## 2021-10-31 DIAGNOSIS — I69391 Dysphagia following cerebral infarction: Secondary | ICD-10-CM | POA: Diagnosis not present

## 2021-10-31 DIAGNOSIS — I69322 Dysarthria following cerebral infarction: Secondary | ICD-10-CM | POA: Diagnosis not present

## 2021-10-31 DIAGNOSIS — M4850XD Collapsed vertebra, not elsewhere classified, site unspecified, subsequent encounter for fracture with routine healing: Secondary | ICD-10-CM | POA: Diagnosis not present

## 2021-10-31 DIAGNOSIS — G8929 Other chronic pain: Secondary | ICD-10-CM | POA: Diagnosis not present

## 2021-10-31 DIAGNOSIS — Z7902 Long term (current) use of antithrombotics/antiplatelets: Secondary | ICD-10-CM | POA: Diagnosis not present

## 2021-10-31 DIAGNOSIS — K219 Gastro-esophageal reflux disease without esophagitis: Secondary | ICD-10-CM | POA: Diagnosis not present

## 2021-10-31 DIAGNOSIS — N1831 Chronic kidney disease, stage 3a: Secondary | ICD-10-CM | POA: Diagnosis not present

## 2021-10-31 DIAGNOSIS — R69 Illness, unspecified: Secondary | ICD-10-CM | POA: Diagnosis not present

## 2021-10-31 DIAGNOSIS — Z7982 Long term (current) use of aspirin: Secondary | ICD-10-CM | POA: Diagnosis not present

## 2021-10-31 DIAGNOSIS — Z8612 Personal history of poliomyelitis: Secondary | ICD-10-CM | POA: Diagnosis not present

## 2021-10-31 DIAGNOSIS — Z993 Dependence on wheelchair: Secondary | ICD-10-CM | POA: Diagnosis not present

## 2021-10-31 DIAGNOSIS — Z853 Personal history of malignant neoplasm of breast: Secondary | ICD-10-CM | POA: Diagnosis not present

## 2021-10-31 DIAGNOSIS — Z7984 Long term (current) use of oral hypoglycemic drugs: Secondary | ICD-10-CM | POA: Diagnosis not present

## 2021-10-31 DIAGNOSIS — I69351 Hemiplegia and hemiparesis following cerebral infarction affecting right dominant side: Secondary | ICD-10-CM | POA: Diagnosis not present

## 2021-11-04 DIAGNOSIS — E7800 Pure hypercholesterolemia, unspecified: Secondary | ICD-10-CM | POA: Diagnosis not present

## 2021-11-04 DIAGNOSIS — Z7902 Long term (current) use of antithrombotics/antiplatelets: Secondary | ICD-10-CM | POA: Diagnosis not present

## 2021-11-04 DIAGNOSIS — I69391 Dysphagia following cerebral infarction: Secondary | ICD-10-CM | POA: Diagnosis not present

## 2021-11-04 DIAGNOSIS — Z7984 Long term (current) use of oral hypoglycemic drugs: Secondary | ICD-10-CM | POA: Diagnosis not present

## 2021-11-04 DIAGNOSIS — Z853 Personal history of malignant neoplasm of breast: Secondary | ICD-10-CM | POA: Diagnosis not present

## 2021-11-04 DIAGNOSIS — E039 Hypothyroidism, unspecified: Secondary | ICD-10-CM | POA: Diagnosis not present

## 2021-11-04 DIAGNOSIS — G8929 Other chronic pain: Secondary | ICD-10-CM | POA: Diagnosis not present

## 2021-11-04 DIAGNOSIS — R69 Illness, unspecified: Secondary | ICD-10-CM | POA: Diagnosis not present

## 2021-11-04 DIAGNOSIS — N1831 Chronic kidney disease, stage 3a: Secondary | ICD-10-CM | POA: Diagnosis not present

## 2021-11-04 DIAGNOSIS — I129 Hypertensive chronic kidney disease with stage 1 through stage 4 chronic kidney disease, or unspecified chronic kidney disease: Secondary | ICD-10-CM | POA: Diagnosis not present

## 2021-11-04 DIAGNOSIS — Z8612 Personal history of poliomyelitis: Secondary | ICD-10-CM | POA: Diagnosis not present

## 2021-11-04 DIAGNOSIS — Z993 Dependence on wheelchair: Secondary | ICD-10-CM | POA: Diagnosis not present

## 2021-11-04 DIAGNOSIS — E1122 Type 2 diabetes mellitus with diabetic chronic kidney disease: Secondary | ICD-10-CM | POA: Diagnosis not present

## 2021-11-04 DIAGNOSIS — M4850XD Collapsed vertebra, not elsewhere classified, site unspecified, subsequent encounter for fracture with routine healing: Secondary | ICD-10-CM | POA: Diagnosis not present

## 2021-11-04 DIAGNOSIS — Z7982 Long term (current) use of aspirin: Secondary | ICD-10-CM | POA: Diagnosis not present

## 2021-11-04 DIAGNOSIS — I69351 Hemiplegia and hemiparesis following cerebral infarction affecting right dominant side: Secondary | ICD-10-CM | POA: Diagnosis not present

## 2021-11-04 DIAGNOSIS — K219 Gastro-esophageal reflux disease without esophagitis: Secondary | ICD-10-CM | POA: Diagnosis not present

## 2021-11-04 DIAGNOSIS — I69322 Dysarthria following cerebral infarction: Secondary | ICD-10-CM | POA: Diagnosis not present

## 2021-11-05 DIAGNOSIS — M4850XD Collapsed vertebra, not elsewhere classified, site unspecified, subsequent encounter for fracture with routine healing: Secondary | ICD-10-CM | POA: Diagnosis not present

## 2021-11-05 DIAGNOSIS — G8929 Other chronic pain: Secondary | ICD-10-CM | POA: Diagnosis not present

## 2021-11-05 DIAGNOSIS — Z7902 Long term (current) use of antithrombotics/antiplatelets: Secondary | ICD-10-CM | POA: Diagnosis not present

## 2021-11-05 DIAGNOSIS — Z993 Dependence on wheelchair: Secondary | ICD-10-CM | POA: Diagnosis not present

## 2021-11-05 DIAGNOSIS — I129 Hypertensive chronic kidney disease with stage 1 through stage 4 chronic kidney disease, or unspecified chronic kidney disease: Secondary | ICD-10-CM | POA: Diagnosis not present

## 2021-11-05 DIAGNOSIS — R69 Illness, unspecified: Secondary | ICD-10-CM | POA: Diagnosis not present

## 2021-11-05 DIAGNOSIS — E7800 Pure hypercholesterolemia, unspecified: Secondary | ICD-10-CM | POA: Diagnosis not present

## 2021-11-05 DIAGNOSIS — N1831 Chronic kidney disease, stage 3a: Secondary | ICD-10-CM | POA: Diagnosis not present

## 2021-11-05 DIAGNOSIS — Z853 Personal history of malignant neoplasm of breast: Secondary | ICD-10-CM | POA: Diagnosis not present

## 2021-11-05 DIAGNOSIS — E039 Hypothyroidism, unspecified: Secondary | ICD-10-CM | POA: Diagnosis not present

## 2021-11-05 DIAGNOSIS — I69351 Hemiplegia and hemiparesis following cerebral infarction affecting right dominant side: Secondary | ICD-10-CM | POA: Diagnosis not present

## 2021-11-05 DIAGNOSIS — I69391 Dysphagia following cerebral infarction: Secondary | ICD-10-CM | POA: Diagnosis not present

## 2021-11-05 DIAGNOSIS — E1122 Type 2 diabetes mellitus with diabetic chronic kidney disease: Secondary | ICD-10-CM | POA: Diagnosis not present

## 2021-11-05 DIAGNOSIS — Z7984 Long term (current) use of oral hypoglycemic drugs: Secondary | ICD-10-CM | POA: Diagnosis not present

## 2021-11-05 DIAGNOSIS — Z7982 Long term (current) use of aspirin: Secondary | ICD-10-CM | POA: Diagnosis not present

## 2021-11-05 DIAGNOSIS — Z8612 Personal history of poliomyelitis: Secondary | ICD-10-CM | POA: Diagnosis not present

## 2021-11-05 DIAGNOSIS — K219 Gastro-esophageal reflux disease without esophagitis: Secondary | ICD-10-CM | POA: Diagnosis not present

## 2021-11-05 DIAGNOSIS — I69322 Dysarthria following cerebral infarction: Secondary | ICD-10-CM | POA: Diagnosis not present

## 2021-11-06 DIAGNOSIS — Z853 Personal history of malignant neoplasm of breast: Secondary | ICD-10-CM | POA: Diagnosis not present

## 2021-11-06 DIAGNOSIS — E7800 Pure hypercholesterolemia, unspecified: Secondary | ICD-10-CM | POA: Diagnosis not present

## 2021-11-06 DIAGNOSIS — Z7982 Long term (current) use of aspirin: Secondary | ICD-10-CM | POA: Diagnosis not present

## 2021-11-06 DIAGNOSIS — Z7902 Long term (current) use of antithrombotics/antiplatelets: Secondary | ICD-10-CM | POA: Diagnosis not present

## 2021-11-06 DIAGNOSIS — I69351 Hemiplegia and hemiparesis following cerebral infarction affecting right dominant side: Secondary | ICD-10-CM | POA: Diagnosis not present

## 2021-11-06 DIAGNOSIS — I69322 Dysarthria following cerebral infarction: Secondary | ICD-10-CM | POA: Diagnosis not present

## 2021-11-06 DIAGNOSIS — Z993 Dependence on wheelchair: Secondary | ICD-10-CM | POA: Diagnosis not present

## 2021-11-06 DIAGNOSIS — R69 Illness, unspecified: Secondary | ICD-10-CM | POA: Diagnosis not present

## 2021-11-06 DIAGNOSIS — M4850XD Collapsed vertebra, not elsewhere classified, site unspecified, subsequent encounter for fracture with routine healing: Secondary | ICD-10-CM | POA: Diagnosis not present

## 2021-11-06 DIAGNOSIS — G8929 Other chronic pain: Secondary | ICD-10-CM | POA: Diagnosis not present

## 2021-11-06 DIAGNOSIS — Z8612 Personal history of poliomyelitis: Secondary | ICD-10-CM | POA: Diagnosis not present

## 2021-11-06 DIAGNOSIS — I69391 Dysphagia following cerebral infarction: Secondary | ICD-10-CM | POA: Diagnosis not present

## 2021-11-06 DIAGNOSIS — I129 Hypertensive chronic kidney disease with stage 1 through stage 4 chronic kidney disease, or unspecified chronic kidney disease: Secondary | ICD-10-CM | POA: Diagnosis not present

## 2021-11-06 DIAGNOSIS — N1831 Chronic kidney disease, stage 3a: Secondary | ICD-10-CM | POA: Diagnosis not present

## 2021-11-06 DIAGNOSIS — E039 Hypothyroidism, unspecified: Secondary | ICD-10-CM | POA: Diagnosis not present

## 2021-11-06 DIAGNOSIS — K219 Gastro-esophageal reflux disease without esophagitis: Secondary | ICD-10-CM | POA: Diagnosis not present

## 2021-11-06 DIAGNOSIS — E1122 Type 2 diabetes mellitus with diabetic chronic kidney disease: Secondary | ICD-10-CM | POA: Diagnosis not present

## 2021-11-06 DIAGNOSIS — Z7984 Long term (current) use of oral hypoglycemic drugs: Secondary | ICD-10-CM | POA: Diagnosis not present

## 2021-11-11 DIAGNOSIS — Z7982 Long term (current) use of aspirin: Secondary | ICD-10-CM | POA: Diagnosis not present

## 2021-11-11 DIAGNOSIS — R69 Illness, unspecified: Secondary | ICD-10-CM | POA: Diagnosis not present

## 2021-11-11 DIAGNOSIS — E7800 Pure hypercholesterolemia, unspecified: Secondary | ICD-10-CM | POA: Diagnosis not present

## 2021-11-11 DIAGNOSIS — E1122 Type 2 diabetes mellitus with diabetic chronic kidney disease: Secondary | ICD-10-CM | POA: Diagnosis not present

## 2021-11-11 DIAGNOSIS — E039 Hypothyroidism, unspecified: Secondary | ICD-10-CM | POA: Diagnosis not present

## 2021-11-11 DIAGNOSIS — G8929 Other chronic pain: Secondary | ICD-10-CM | POA: Diagnosis not present

## 2021-11-11 DIAGNOSIS — N1831 Chronic kidney disease, stage 3a: Secondary | ICD-10-CM | POA: Diagnosis not present

## 2021-11-11 DIAGNOSIS — K219 Gastro-esophageal reflux disease without esophagitis: Secondary | ICD-10-CM | POA: Diagnosis not present

## 2021-11-11 DIAGNOSIS — I129 Hypertensive chronic kidney disease with stage 1 through stage 4 chronic kidney disease, or unspecified chronic kidney disease: Secondary | ICD-10-CM | POA: Diagnosis not present

## 2021-11-11 DIAGNOSIS — Z7902 Long term (current) use of antithrombotics/antiplatelets: Secondary | ICD-10-CM | POA: Diagnosis not present

## 2021-11-11 DIAGNOSIS — Z8612 Personal history of poliomyelitis: Secondary | ICD-10-CM | POA: Diagnosis not present

## 2021-11-11 DIAGNOSIS — I69351 Hemiplegia and hemiparesis following cerebral infarction affecting right dominant side: Secondary | ICD-10-CM | POA: Diagnosis not present

## 2021-11-11 DIAGNOSIS — I69391 Dysphagia following cerebral infarction: Secondary | ICD-10-CM | POA: Diagnosis not present

## 2021-11-11 DIAGNOSIS — Z853 Personal history of malignant neoplasm of breast: Secondary | ICD-10-CM | POA: Diagnosis not present

## 2021-11-11 DIAGNOSIS — I69322 Dysarthria following cerebral infarction: Secondary | ICD-10-CM | POA: Diagnosis not present

## 2021-11-11 DIAGNOSIS — Z7984 Long term (current) use of oral hypoglycemic drugs: Secondary | ICD-10-CM | POA: Diagnosis not present

## 2021-11-11 DIAGNOSIS — Z993 Dependence on wheelchair: Secondary | ICD-10-CM | POA: Diagnosis not present

## 2021-11-11 DIAGNOSIS — M4850XD Collapsed vertebra, not elsewhere classified, site unspecified, subsequent encounter for fracture with routine healing: Secondary | ICD-10-CM | POA: Diagnosis not present

## 2021-11-12 DIAGNOSIS — I69391 Dysphagia following cerebral infarction: Secondary | ICD-10-CM | POA: Diagnosis not present

## 2021-11-12 DIAGNOSIS — I69351 Hemiplegia and hemiparesis following cerebral infarction affecting right dominant side: Secondary | ICD-10-CM | POA: Diagnosis not present

## 2021-11-12 DIAGNOSIS — I69322 Dysarthria following cerebral infarction: Secondary | ICD-10-CM | POA: Diagnosis not present

## 2021-11-12 DIAGNOSIS — G8929 Other chronic pain: Secondary | ICD-10-CM | POA: Diagnosis not present

## 2021-11-12 DIAGNOSIS — K219 Gastro-esophageal reflux disease without esophagitis: Secondary | ICD-10-CM | POA: Diagnosis not present

## 2021-11-12 DIAGNOSIS — I129 Hypertensive chronic kidney disease with stage 1 through stage 4 chronic kidney disease, or unspecified chronic kidney disease: Secondary | ICD-10-CM | POA: Diagnosis not present

## 2021-11-12 DIAGNOSIS — Z993 Dependence on wheelchair: Secondary | ICD-10-CM | POA: Diagnosis not present

## 2021-11-12 DIAGNOSIS — Z7982 Long term (current) use of aspirin: Secondary | ICD-10-CM | POA: Diagnosis not present

## 2021-11-12 DIAGNOSIS — M4850XD Collapsed vertebra, not elsewhere classified, site unspecified, subsequent encounter for fracture with routine healing: Secondary | ICD-10-CM | POA: Diagnosis not present

## 2021-11-12 DIAGNOSIS — E7800 Pure hypercholesterolemia, unspecified: Secondary | ICD-10-CM | POA: Diagnosis not present

## 2021-11-12 DIAGNOSIS — Z7984 Long term (current) use of oral hypoglycemic drugs: Secondary | ICD-10-CM | POA: Diagnosis not present

## 2021-11-12 DIAGNOSIS — E039 Hypothyroidism, unspecified: Secondary | ICD-10-CM | POA: Diagnosis not present

## 2021-11-12 DIAGNOSIS — Z7902 Long term (current) use of antithrombotics/antiplatelets: Secondary | ICD-10-CM | POA: Diagnosis not present

## 2021-11-12 DIAGNOSIS — N1831 Chronic kidney disease, stage 3a: Secondary | ICD-10-CM | POA: Diagnosis not present

## 2021-11-12 DIAGNOSIS — Z853 Personal history of malignant neoplasm of breast: Secondary | ICD-10-CM | POA: Diagnosis not present

## 2021-11-12 DIAGNOSIS — E1122 Type 2 diabetes mellitus with diabetic chronic kidney disease: Secondary | ICD-10-CM | POA: Diagnosis not present

## 2021-11-12 DIAGNOSIS — Z8612 Personal history of poliomyelitis: Secondary | ICD-10-CM | POA: Diagnosis not present

## 2021-11-12 DIAGNOSIS — R69 Illness, unspecified: Secondary | ICD-10-CM | POA: Diagnosis not present

## 2021-11-13 ENCOUNTER — Other Ambulatory Visit: Payer: Self-pay | Admitting: Obstetrics and Gynecology

## 2021-11-13 ENCOUNTER — Telehealth: Payer: Self-pay | Admitting: Family

## 2021-11-14 DIAGNOSIS — I69351 Hemiplegia and hemiparesis following cerebral infarction affecting right dominant side: Secondary | ICD-10-CM | POA: Diagnosis not present

## 2021-11-14 DIAGNOSIS — Z8612 Personal history of poliomyelitis: Secondary | ICD-10-CM | POA: Diagnosis not present

## 2021-11-14 DIAGNOSIS — E1122 Type 2 diabetes mellitus with diabetic chronic kidney disease: Secondary | ICD-10-CM | POA: Diagnosis not present

## 2021-11-14 DIAGNOSIS — N1831 Chronic kidney disease, stage 3a: Secondary | ICD-10-CM | POA: Diagnosis not present

## 2021-11-14 DIAGNOSIS — E039 Hypothyroidism, unspecified: Secondary | ICD-10-CM | POA: Diagnosis not present

## 2021-11-14 DIAGNOSIS — I69322 Dysarthria following cerebral infarction: Secondary | ICD-10-CM | POA: Diagnosis not present

## 2021-11-14 DIAGNOSIS — R69 Illness, unspecified: Secondary | ICD-10-CM | POA: Diagnosis not present

## 2021-11-14 DIAGNOSIS — M4850XD Collapsed vertebra, not elsewhere classified, site unspecified, subsequent encounter for fracture with routine healing: Secondary | ICD-10-CM | POA: Diagnosis not present

## 2021-11-14 DIAGNOSIS — Z7982 Long term (current) use of aspirin: Secondary | ICD-10-CM | POA: Diagnosis not present

## 2021-11-14 DIAGNOSIS — Z7984 Long term (current) use of oral hypoglycemic drugs: Secondary | ICD-10-CM | POA: Diagnosis not present

## 2021-11-14 DIAGNOSIS — Z7902 Long term (current) use of antithrombotics/antiplatelets: Secondary | ICD-10-CM | POA: Diagnosis not present

## 2021-11-14 DIAGNOSIS — K219 Gastro-esophageal reflux disease without esophagitis: Secondary | ICD-10-CM | POA: Diagnosis not present

## 2021-11-14 DIAGNOSIS — Z853 Personal history of malignant neoplasm of breast: Secondary | ICD-10-CM | POA: Diagnosis not present

## 2021-11-14 DIAGNOSIS — I129 Hypertensive chronic kidney disease with stage 1 through stage 4 chronic kidney disease, or unspecified chronic kidney disease: Secondary | ICD-10-CM | POA: Diagnosis not present

## 2021-11-14 DIAGNOSIS — I69391 Dysphagia following cerebral infarction: Secondary | ICD-10-CM | POA: Diagnosis not present

## 2021-11-14 DIAGNOSIS — G8929 Other chronic pain: Secondary | ICD-10-CM | POA: Diagnosis not present

## 2021-11-14 DIAGNOSIS — Z993 Dependence on wheelchair: Secondary | ICD-10-CM | POA: Diagnosis not present

## 2021-11-14 DIAGNOSIS — E7800 Pure hypercholesterolemia, unspecified: Secondary | ICD-10-CM | POA: Diagnosis not present

## 2021-11-18 ENCOUNTER — Other Ambulatory Visit: Payer: Self-pay

## 2021-11-18 DIAGNOSIS — I69351 Hemiplegia and hemiparesis following cerebral infarction affecting right dominant side: Secondary | ICD-10-CM | POA: Diagnosis not present

## 2021-11-18 DIAGNOSIS — Z7984 Long term (current) use of oral hypoglycemic drugs: Secondary | ICD-10-CM | POA: Diagnosis not present

## 2021-11-18 DIAGNOSIS — Z853 Personal history of malignant neoplasm of breast: Secondary | ICD-10-CM | POA: Diagnosis not present

## 2021-11-18 DIAGNOSIS — N1831 Chronic kidney disease, stage 3a: Secondary | ICD-10-CM | POA: Diagnosis not present

## 2021-11-18 DIAGNOSIS — I69322 Dysarthria following cerebral infarction: Secondary | ICD-10-CM | POA: Diagnosis not present

## 2021-11-18 DIAGNOSIS — E1122 Type 2 diabetes mellitus with diabetic chronic kidney disease: Secondary | ICD-10-CM

## 2021-11-18 DIAGNOSIS — E7800 Pure hypercholesterolemia, unspecified: Secondary | ICD-10-CM | POA: Diagnosis not present

## 2021-11-18 DIAGNOSIS — K219 Gastro-esophageal reflux disease without esophagitis: Secondary | ICD-10-CM | POA: Diagnosis not present

## 2021-11-18 DIAGNOSIS — Z7982 Long term (current) use of aspirin: Secondary | ICD-10-CM | POA: Diagnosis not present

## 2021-11-18 DIAGNOSIS — Z8612 Personal history of poliomyelitis: Secondary | ICD-10-CM | POA: Diagnosis not present

## 2021-11-18 DIAGNOSIS — E039 Hypothyroidism, unspecified: Secondary | ICD-10-CM | POA: Diagnosis not present

## 2021-11-18 DIAGNOSIS — Z7902 Long term (current) use of antithrombotics/antiplatelets: Secondary | ICD-10-CM | POA: Diagnosis not present

## 2021-11-18 DIAGNOSIS — M4850XD Collapsed vertebra, not elsewhere classified, site unspecified, subsequent encounter for fracture with routine healing: Secondary | ICD-10-CM | POA: Diagnosis not present

## 2021-11-18 DIAGNOSIS — I129 Hypertensive chronic kidney disease with stage 1 through stage 4 chronic kidney disease, or unspecified chronic kidney disease: Secondary | ICD-10-CM | POA: Diagnosis not present

## 2021-11-18 DIAGNOSIS — G8929 Other chronic pain: Secondary | ICD-10-CM | POA: Diagnosis not present

## 2021-11-18 DIAGNOSIS — Z993 Dependence on wheelchair: Secondary | ICD-10-CM | POA: Diagnosis not present

## 2021-11-18 DIAGNOSIS — I69391 Dysphagia following cerebral infarction: Secondary | ICD-10-CM | POA: Diagnosis not present

## 2021-11-18 DIAGNOSIS — R69 Illness, unspecified: Secondary | ICD-10-CM | POA: Diagnosis not present

## 2021-11-18 MED ORDER — FREESTYLE LIBRE 3 SENSOR MISC: 5 refills | Status: DC

## 2021-11-18 MED ORDER — LEVOTHYROXINE SODIUM 125 MCG PO TABS: 125.0000 ug | ORAL_TABLET | Freq: Every day | ORAL | 0 refills | Status: DC

## 2021-11-18 MED ORDER — VENLAFAXINE HCL ER 150 MG PO CP24: 150.0000 mg | ORAL_CAPSULE | Freq: Every day | ORAL | 0 refills | Status: DC

## 2021-11-18 MED ORDER — CITALOPRAM HYDROBROMIDE 10 MG PO TABS: 10.0000 mg | ORAL_TABLET | Freq: Every day | ORAL | 0 refills | Status: DC

## 2021-11-18 NOTE — Telephone Encounter (Signed)
Pt need a refill on levothyroxine, venlafaxine, citalopram sent to [facility]. Pt does not have libre sensors either

## 2021-11-18 NOTE — Telephone Encounter (Signed)
sent 

## 2021-11-20 DIAGNOSIS — Z7984 Long term (current) use of oral hypoglycemic drugs: Secondary | ICD-10-CM | POA: Diagnosis not present

## 2021-11-20 DIAGNOSIS — M4850XD Collapsed vertebra, not elsewhere classified, site unspecified, subsequent encounter for fracture with routine healing: Secondary | ICD-10-CM | POA: Diagnosis not present

## 2021-11-20 DIAGNOSIS — Z7902 Long term (current) use of antithrombotics/antiplatelets: Secondary | ICD-10-CM | POA: Diagnosis not present

## 2021-11-20 DIAGNOSIS — Z7982 Long term (current) use of aspirin: Secondary | ICD-10-CM | POA: Diagnosis not present

## 2021-11-20 DIAGNOSIS — E1122 Type 2 diabetes mellitus with diabetic chronic kidney disease: Secondary | ICD-10-CM | POA: Diagnosis not present

## 2021-11-20 DIAGNOSIS — E039 Hypothyroidism, unspecified: Secondary | ICD-10-CM | POA: Diagnosis not present

## 2021-11-20 DIAGNOSIS — K219 Gastro-esophageal reflux disease without esophagitis: Secondary | ICD-10-CM | POA: Diagnosis not present

## 2021-11-20 DIAGNOSIS — R69 Illness, unspecified: Secondary | ICD-10-CM | POA: Diagnosis not present

## 2021-11-20 DIAGNOSIS — N1831 Chronic kidney disease, stage 3a: Secondary | ICD-10-CM | POA: Diagnosis not present

## 2021-11-20 DIAGNOSIS — Z853 Personal history of malignant neoplasm of breast: Secondary | ICD-10-CM | POA: Diagnosis not present

## 2021-11-20 DIAGNOSIS — I69322 Dysarthria following cerebral infarction: Secondary | ICD-10-CM | POA: Diagnosis not present

## 2021-11-20 DIAGNOSIS — E7800 Pure hypercholesterolemia, unspecified: Secondary | ICD-10-CM | POA: Diagnosis not present

## 2021-11-20 DIAGNOSIS — I69351 Hemiplegia and hemiparesis following cerebral infarction affecting right dominant side: Secondary | ICD-10-CM | POA: Diagnosis not present

## 2021-11-20 DIAGNOSIS — I129 Hypertensive chronic kidney disease with stage 1 through stage 4 chronic kidney disease, or unspecified chronic kidney disease: Secondary | ICD-10-CM | POA: Diagnosis not present

## 2021-11-20 DIAGNOSIS — Z993 Dependence on wheelchair: Secondary | ICD-10-CM | POA: Diagnosis not present

## 2021-11-20 DIAGNOSIS — Z8612 Personal history of poliomyelitis: Secondary | ICD-10-CM | POA: Diagnosis not present

## 2021-11-20 DIAGNOSIS — I69391 Dysphagia following cerebral infarction: Secondary | ICD-10-CM | POA: Diagnosis not present

## 2021-11-20 DIAGNOSIS — G8929 Other chronic pain: Secondary | ICD-10-CM | POA: Diagnosis not present

## 2021-11-21 DIAGNOSIS — E7800 Pure hypercholesterolemia, unspecified: Secondary | ICD-10-CM | POA: Diagnosis not present

## 2021-11-21 DIAGNOSIS — Z993 Dependence on wheelchair: Secondary | ICD-10-CM | POA: Diagnosis not present

## 2021-11-21 DIAGNOSIS — I69351 Hemiplegia and hemiparesis following cerebral infarction affecting right dominant side: Secondary | ICD-10-CM | POA: Diagnosis not present

## 2021-11-21 DIAGNOSIS — M4850XA Collapsed vertebra, not elsewhere classified, site unspecified, initial encounter for fracture: Secondary | ICD-10-CM | POA: Diagnosis not present

## 2021-11-21 DIAGNOSIS — Z7984 Long term (current) use of oral hypoglycemic drugs: Secondary | ICD-10-CM | POA: Diagnosis not present

## 2021-11-21 DIAGNOSIS — R69 Illness, unspecified: Secondary | ICD-10-CM | POA: Diagnosis not present

## 2021-11-21 DIAGNOSIS — Z7982 Long term (current) use of aspirin: Secondary | ICD-10-CM | POA: Diagnosis not present

## 2021-11-21 DIAGNOSIS — I69322 Dysarthria following cerebral infarction: Secondary | ICD-10-CM | POA: Diagnosis not present

## 2021-11-21 DIAGNOSIS — Z8612 Personal history of poliomyelitis: Secondary | ICD-10-CM | POA: Diagnosis not present

## 2021-11-21 DIAGNOSIS — Z7902 Long term (current) use of antithrombotics/antiplatelets: Secondary | ICD-10-CM | POA: Diagnosis not present

## 2021-11-21 DIAGNOSIS — G8929 Other chronic pain: Secondary | ICD-10-CM | POA: Diagnosis not present

## 2021-11-21 DIAGNOSIS — N1831 Chronic kidney disease, stage 3a: Secondary | ICD-10-CM | POA: Diagnosis not present

## 2021-11-21 DIAGNOSIS — E1122 Type 2 diabetes mellitus with diabetic chronic kidney disease: Secondary | ICD-10-CM | POA: Diagnosis not present

## 2021-11-21 DIAGNOSIS — E039 Hypothyroidism, unspecified: Secondary | ICD-10-CM | POA: Diagnosis not present

## 2021-11-21 DIAGNOSIS — I639 Cerebral infarction, unspecified: Secondary | ICD-10-CM | POA: Diagnosis not present

## 2021-11-21 DIAGNOSIS — K219 Gastro-esophageal reflux disease without esophagitis: Secondary | ICD-10-CM | POA: Diagnosis not present

## 2021-11-21 DIAGNOSIS — M4850XD Collapsed vertebra, not elsewhere classified, site unspecified, subsequent encounter for fracture with routine healing: Secondary | ICD-10-CM | POA: Diagnosis not present

## 2021-11-21 DIAGNOSIS — I129 Hypertensive chronic kidney disease with stage 1 through stage 4 chronic kidney disease, or unspecified chronic kidney disease: Secondary | ICD-10-CM | POA: Diagnosis not present

## 2021-11-21 DIAGNOSIS — Z853 Personal history of malignant neoplasm of breast: Secondary | ICD-10-CM | POA: Diagnosis not present

## 2021-11-21 DIAGNOSIS — I69391 Dysphagia following cerebral infarction: Secondary | ICD-10-CM | POA: Diagnosis not present

## 2021-11-25 ENCOUNTER — Other Ambulatory Visit: Payer: Self-pay

## 2021-11-25 ENCOUNTER — Telehealth: Payer: Self-pay | Admitting: Internal Medicine

## 2021-11-25 MED ORDER — CLOPIDOGREL BISULFATE 75 MG PO TABS: 75.0000 mg | ORAL_TABLET | Freq: Every day | ORAL | 1 refills | Status: DC

## 2021-11-25 MED ORDER — ATORVASTATIN CALCIUM 80 MG PO TABS: 80.0000 mg | ORAL_TABLET | Freq: Every day | ORAL | 1 refills | Status: DC

## 2021-11-25 NOTE — Telephone Encounter (Signed)
Pt daughter would like to be called in regards to a med refill on plavix and atorvastatin

## 2021-11-25 NOTE — Telephone Encounter (Signed)
Lm for [facility] Rx's sent

## 2021-12-12 ENCOUNTER — Telehealth: Payer: Self-pay | Admitting: Internal Medicine

## 2021-12-12 NOTE — Telephone Encounter (Signed)
Pt daughter would like to be called regarding placing a home health referral for pt

## 2021-12-15 NOTE — Telephone Encounter (Signed)
LM for [name] to cb

## 2021-12-21 DIAGNOSIS — M4850XA Collapsed vertebra, not elsewhere classified, site unspecified, initial encounter for fracture: Secondary | ICD-10-CM | POA: Diagnosis not present

## 2021-12-21 DIAGNOSIS — I639 Cerebral infarction, unspecified: Secondary | ICD-10-CM | POA: Diagnosis not present

## 2022-01-15 ENCOUNTER — Other Ambulatory Visit: Payer: Self-pay | Admitting: Internal Medicine

## 2022-01-21 DIAGNOSIS — I639 Cerebral infarction, unspecified: Secondary | ICD-10-CM | POA: Diagnosis not present

## 2022-01-21 DIAGNOSIS — M4850XA Collapsed vertebra, not elsewhere classified, site unspecified, initial encounter for fracture: Secondary | ICD-10-CM | POA: Diagnosis not present

## 2022-02-11 ENCOUNTER — Other Ambulatory Visit: Payer: Self-pay | Admitting: Internal Medicine

## 2022-02-16 ENCOUNTER — Other Ambulatory Visit: Payer: Self-pay | Admitting: Internal Medicine

## 2022-02-21 DIAGNOSIS — I639 Cerebral infarction, unspecified: Secondary | ICD-10-CM | POA: Diagnosis not present

## 2022-02-21 DIAGNOSIS — M4850XA Collapsed vertebra, not elsewhere classified, site unspecified, initial encounter for fracture: Secondary | ICD-10-CM | POA: Diagnosis not present

## 2022-03-22 DIAGNOSIS — M4850XA Collapsed vertebra, not elsewhere classified, site unspecified, initial encounter for fracture: Secondary | ICD-10-CM | POA: Diagnosis not present

## 2022-03-22 DIAGNOSIS — I639 Cerebral infarction, unspecified: Secondary | ICD-10-CM | POA: Diagnosis not present

## 2022-04-09 ENCOUNTER — Other Ambulatory Visit: Payer: Self-pay | Admitting: Internal Medicine

## 2022-04-13 ENCOUNTER — Other Ambulatory Visit: Payer: Self-pay | Admitting: Internal Medicine

## 2022-04-22 DIAGNOSIS — M4850XA Collapsed vertebra, not elsewhere classified, site unspecified, initial encounter for fracture: Secondary | ICD-10-CM | POA: Diagnosis not present

## 2022-04-22 DIAGNOSIS — I639 Cerebral infarction, unspecified: Secondary | ICD-10-CM | POA: Diagnosis not present

## 2022-05-12 ENCOUNTER — Other Ambulatory Visit: Payer: Self-pay | Admitting: Internal Medicine

## 2022-05-22 DIAGNOSIS — M4850XA Collapsed vertebra, not elsewhere classified, site unspecified, initial encounter for fracture: Secondary | ICD-10-CM | POA: Diagnosis not present

## 2022-05-22 DIAGNOSIS — I639 Cerebral infarction, unspecified: Secondary | ICD-10-CM | POA: Diagnosis not present

## 2022-06-08 ENCOUNTER — Other Ambulatory Visit: Payer: Self-pay | Admitting: Internal Medicine

## 2022-06-22 DIAGNOSIS — I639 Cerebral infarction, unspecified: Secondary | ICD-10-CM | POA: Diagnosis not present

## 2022-06-22 DIAGNOSIS — M4850XA Collapsed vertebra, not elsewhere classified, site unspecified, initial encounter for fracture: Secondary | ICD-10-CM | POA: Diagnosis not present

## 2022-07-11 ENCOUNTER — Other Ambulatory Visit: Payer: Self-pay | Admitting: Internal Medicine

## 2022-08-09 ENCOUNTER — Other Ambulatory Visit: Payer: Self-pay | Admitting: Internal Medicine

## 2022-08-15 ENCOUNTER — Other Ambulatory Visit: Payer: Self-pay | Admitting: Internal Medicine

## 2022-08-20 ENCOUNTER — Encounter (INDEPENDENT_AMBULATORY_CARE_PROVIDER_SITE_OTHER): Payer: Self-pay

## 2022-09-10 ENCOUNTER — Other Ambulatory Visit: Payer: Self-pay | Admitting: Internal Medicine

## 2022-09-24 ENCOUNTER — Ambulatory Visit: Payer: Medicare HMO | Admitting: Internal Medicine

## 2022-10-08 ENCOUNTER — Other Ambulatory Visit: Payer: Self-pay | Admitting: Internal Medicine

## 2022-10-23 ENCOUNTER — Telehealth: Payer: Self-pay | Admitting: Internal Medicine

## 2022-10-23 NOTE — Telephone Encounter (Signed)
Spoke with daughter and advised that visit and positive test is required in order to prescribe antiviral. Gave info about virtual urgent care visit.

## 2022-10-23 NOTE — Telephone Encounter (Signed)
Patient's husband is in the hospital with COVID. Patient's daughter Misty Stanley called and was just told by home health car nurse that patient is starting with cold symptoms. Misty Stanley wanted to know if her mother could get the paxlovid.

## 2022-10-24 ENCOUNTER — Telehealth: Payer: Medicare HMO | Admitting: Family Medicine

## 2022-10-24 ENCOUNTER — Telehealth: Payer: Medicare HMO | Admitting: Physician Assistant

## 2022-10-24 DIAGNOSIS — R110 Nausea: Secondary | ICD-10-CM

## 2022-10-24 DIAGNOSIS — U071 COVID-19: Secondary | ICD-10-CM

## 2022-10-24 DIAGNOSIS — Z5971 Insufficient health insurance coverage: Secondary | ICD-10-CM

## 2022-10-24 MED ORDER — ONDANSETRON HCL 4 MG PO TABS: 4.0000 mg | ORAL_TABLET | Freq: Every day | ORAL | 0 refills | Status: AC | PRN

## 2022-10-24 MED ORDER — GUAIFENESIN 200 MG PO TABS
200.0000 mg | ORAL_TABLET | ORAL | 0 refills | Status: AC | PRN
Start: 2022-10-24 — End: 2022-10-31

## 2022-10-24 MED ORDER — MOLNUPIRAVIR EUA 200MG CAPSULE
4.0000 | ORAL_CAPSULE | Freq: Two times a day (BID) | ORAL | 0 refills | Status: AC
Start: 2022-10-24 — End: 2022-10-29

## 2022-10-24 NOTE — Patient Instructions (Signed)
Joyce Maynard, thank you for joining [employer], PA-C for today's virtual visit.  While this provider is not your primary care provider (PCP), if your PCP is located in our provider database this encounter information will be shared with them immediately following your visit.   A [facility] MyChart account gives you access to today's visit and all your visits, tests, and labs performed at [facility] " click here if you don't have a  MyChart account or go to [email or web address]  Consent: (Patient) Lun E Cuen provided verbal consent for this virtual visit at the beginning of the encounter.  Current Medications:  Current Outpatient Medications:    guaiFENesin 200 MG tablet, Take 1 tablet (200 mg total) by mouth every 4 (four) hours as needed for up to 7 days for cough or to loosen phlegm., Disp: 30 tablet, Rfl: 0   molnupiravir EUA (LAGEVRIO) 200 mg CAPS capsule, Take 4 capsules (800 mg total) by mouth 2 (two) times daily for 5 days., Disp: 40 capsule, Rfl: 0   ondansetron (ZOFRAN) 4 MG tablet, Take 1 tablet (4 mg total) by mouth daily as needed for up to 7 days for nausea or vomiting., Disp: 7 tablet, Rfl: 0   acetaminophen (TYLENOL) 500 MG tablet, Take 2 tablets (1,000 mg total) by mouth every 8 (eight) hours., Disp: 30 tablet, Rfl: 0   atorvastatin (LIPITOR) 80 MG tablet, Take 1 tablet by mouth once daily, Disp: 90 tablet, Rfl: 0   citalopram (CELEXA) 10 MG tablet, Take 1 tablet by mouth once daily, Disp: 90 tablet, Rfl: 0   clopidogrel (PLAVIX) 75 MG tablet, Take 1 tablet by mouth once daily, Disp: 90 tablet, Rfl: 0   Continuous Blood Gluc Sensor (FREESTYLE LIBRE 3 SENSOR) MISC, Place 1 sensor on the skin every 14 days. Use to check glucose continuously, Disp: 2 each, Rfl: 5   docusate sodium (COLACE) 100 MG capsule, Take 1 capsule (100 mg total) by mouth 2 (two) times daily., Disp: 10 capsule, Rfl: 0   levothyroxine (SYNTHROID) 125 MCG tablet, Take 1 tablet by  mouth once daily, Disp: 30 tablet, Rfl: 2   metFORMIN (GLUCOPHAGE-XR) 500 MG 24 hr tablet, Take 2 tablets by mouth twice daily, Disp: 120 tablet, Rfl: 0   metoprolol succinate (TOPROL-XL) 25 MG 24 hr tablet, Take 1 tablet by mouth once daily, Disp: 90 tablet, Rfl: 0   pantoprazole (PROTONIX) 40 MG tablet, TAKE 1 TABLET BY MOUTH TWICE DAILY WITH A MEAL, Disp: 180 tablet, Rfl: 0   venlafaxine XR (EFFEXOR-XR) 150 MG 24 hr capsule, Take 1 capsule by mouth once daily, Disp: 90 capsule, Rfl: 0   Medications ordered in this encounter:  Meds ordered this encounter  Medications   guaiFENesin 200 MG tablet    Sig: Take 1 tablet (200 mg total) by mouth every 4 (four) hours as needed for up to 7 days for cough or to loosen phlegm.    Dispense:  30 tablet    Refill:  0   molnupiravir EUA (LAGEVRIO) 200 mg CAPS capsule    Sig: Take 4 capsules (800 mg total) by mouth 2 (two) times daily for 5 days.    Dispense:  40 capsule    Refill:  0   ondansetron (ZOFRAN) 4 MG tablet    Sig: Take 1 tablet (4 mg total) by mouth daily as needed for up to 7 days for nausea or vomiting.    Dispense:  7 tablet    Refill:  0     *  If you need refills on other medications prior to your next appointment, please contact your pharmacy*  Follow-Up: Call back or seek an in-person evaluation if the symptoms worsen or if the condition fails to improve as anticipated.  [facility] [phone]  Other Instructions COVID-19 COVID-19 is an infection caused by a virus called SARS-CoV-2. This type of virus is called a coronavirus. People with COVID-19 may: Have little to no symptoms. Have mild to moderate symptoms that affect their lungs and breathing. Get very sick. What are the causes? COVID-19 is caused by a virus. This virus may be in the air as droplets or on surfaces. It can spread from an infected person when they cough, sneeze, speak, sing, or breathe. You may become infected if: You breathe in the  infected droplets in the air. You touch an object that has the virus on it. What increases the risk? You are at risk of getting COVID-19 if you have been around someone with the infection. You may be more likely to get very sick if: You are 23 years old or older. You have certain medical conditions, such as: Heart disease. Diabetes. Chronic respiratory disease. Cancer. Pregnancy. You are immunocompromised. This means your body cannot fight infections easily. You have a disability or trouble moving, meaning you're immobile. What are the signs or symptoms? People may have different symptoms from COVID-19. The symptoms can also be mild to severe. They often show up in 5-6 days after being infected. But they can take up to 14 days to appear. Common symptoms are: Cough. Feeling tired. New loss of taste or smell. Fever. Less common symptoms are: Sore throat. Headache. Body or muscle aches. Diarrhea. A skin rash or odd-colored fingers or toes. Red or irritated eyes. Sometimes, COVID-19 does not cause symptoms. How is this diagnosed? COVID-19 can be diagnosed with tests done in the lab or at home. Fluid from your nose, mouth, or lungs will be used to check for the virus. How is this treated? Treatment for COVID-19 depends on how sick you are. Mild symptoms can be treated at home with rest, fluids, and over-the-counter medicines. Severe symptoms may be treated in a hospital intensive care unit (ICU). If you have symptoms and are at risk of getting very sick, you may be given a medicine that fights viruses. This medicine is called an antiviral. How is this prevented? To protect yourself from COVID-19: Know your risk factors. Get vaccinated. If your body cannot fight infections easily, talk to your provider about treatment to help prevent COVID-19. Stay at least 1 meter away from others. Wear a well-fitted mask when: You can't stay at a distance from people. You're in a place with poor  air flow. Try to be in open spaces with good air flow when in public. Wash your hands often or use an alcohol-based hand sanitizer. Cover your nose and mouth when coughing and sneezing. If you think you have COVID-19 or have been around someone who has it, stay home and be by yourself for 5-10 days. Where to find more information Centers for Disease Control and Prevention (CDC): TonerPromos.no World Health Organization [facility]): VisitDestination.com.br Get help right away if: You have trouble breathing or get short of breath. You have pain or pressure in your chest. You cannot speak or move any part of your body. You are confused. Your symptoms get worse. These symptoms may be an emergency. Get help right away. Call 911. Do not wait to see if the symptoms  will go away. Do not drive yourself to the hospital. This information is not intended to replace advice given to you by your health care provider. Make sure you discuss any questions you have with your health care provider. Document Revised: 12/30/2021 Document Reviewed: 09/05/2021 Elsevier Patient Education  2024 Elsevier Inc.    If you have been instructed to have an in-person evaluation today at a local Urgent Care facility, please use the link below. It will take you to a list of all of our available [facility], including address, phone number and hours of operation. Please do not delay care.  [facility]  If you or a family member do not have a primary care provider, use the link below to schedule a visit and establish care. When you choose a [facility] primary care physician or advanced practice provider, you gain a long-term partner in health. Find a Primary Care Provider  Learn more about [facility]'s in-office and virtual care options: [facility] - Get Care Now

## 2022-10-24 NOTE — Progress Notes (Signed)
Follow-up question from pt and daughter regarding antiviral. Not covered but she is not candidate for Paxlovid without renal function assessment since has been > 1 year. PA completed for molnupiravir and approved. No charge for follow-up.

## 2022-10-24 NOTE — Progress Notes (Signed)
Virtual Visit Consent   Joyce Maynard, you are scheduled for a virtual visit with a [facility] provider today. Just as with appointments in the office, your consent must be obtained to participate. Your consent will be active for this visit and any virtual visit you may have with one of our providers in the next 365 days. If you have a MyChart account, a copy of this consent can be sent to you electronically.  As this is a virtual visit, video technology does not allow for your provider to perform a traditional examination. This may limit your provider's ability to fully assess your condition. If your provider identifies any concerns that need to be evaluated in person or the need to arrange testing (such as labs, EKG, etc.), we will make arrangements to do so. Although advances in technology are sophisticated, we cannot ensure that it will always work on either your end or our end. If the connection with a video visit is poor, the visit may have to be switched to a telephone visit. With either a video or telephone visit, we are not always able to ensure that we have a secure connection.  By engaging in this virtual visit, you consent to the provision of healthcare and authorize for your insurance to be billed (if applicable) for the services provided during this visit. Depending on your insurance coverage, you may receive a charge related to this service.  I need to obtain your verbal consent now. Are you willing to proceed with your visit today? Joyce Maynard has provided verbal consent on 10/24/2022 for a virtual visit (video or telephone). Joyce Maynard, [place]  Date: 10/24/2022 11:52 AM  Virtual Visit via Video Note   I, Joyce Maynard, connected with  REGENA REISENAUER  ([id number], May 31, 1940) on 10/24/22 at 11:45 AM EDT by a video-enabled telemedicine application and verified that I am speaking with the correct person using two identifiers.  Location: Patient: Virtual Visit Location Patient:  Home Provider: Virtual Visit Location Provider: Home Office   I discussed the limitations of evaluation and management by telemedicine and the availability of in person appointments. The patient expressed understanding and agreed to proceed.    History of Present Illness: Joyce Maynard is a 82 y.o. who identifies as a female who was assigned female at birth, and is being seen today for c/o Pt states she has trouble speaking. Pt states she has a positive COVID test and has nausea.  Pt states her grandson will help her with the history. Grandson states pulse ox is 92.  Pt states no difficulty breathing at the moment.  Grandson states symptoms started in the last 24 hours. Pt's grandson has shown [place] a positive COVID test. Pt states husband is in the hospital with COVID but should be coming home today.   HPI: HPI  Problems:  Patient Active Problem List   Diagnosis Date Noted   Acute CVA (cerebrovascular accident) (HCC) 09/20/2021   Vertebral compression fracture (HCC) 07/20/2021   Osteoporosis 07/20/2021   AKI (acute kidney injury) (HCC) 07/20/2021   Back pain 07/19/2021   Right groin pain 12/14/2019   History of medication noncompliance 11/25/2019   History of CVA (cerebrovascular accident) 10/18/2019   Stroke (HCC) 10/17/2019   Anxiety 10/17/2019   Nausea vomiting and diarrhea 10/17/2019   Hypokalemia 10/17/2019   Weakness    CKD (chronic kidney disease), stage IIIa 12/12/2018   Left breast lump 09/10/2015   Health care maintenance 10/05/2014   Mild  depression 04/16/2014   Other malaise and fatigue 09/20/2013   Leg pain 01/18/2013   Essential hypertension, benign 04/18/2012   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   Hypercholesterolemia 04/18/2012   History of breast cancer 04/18/2012   Hypothyroidism 04/18/2012   GERD (gastroesophageal reflux disease) 04/18/2012    Allergies: No Known Allergies Medications:  Current Outpatient Medications:     guaiFENesin 200 MG tablet, Take 1 tablet (200 mg total) by mouth every 4 (four) hours as needed for up to 7 days for cough or to loosen phlegm., Disp: 30 tablet, Rfl: 0   molnupiravir EUA (LAGEVRIO) 200 mg CAPS capsule, Take 4 capsules (800 mg total) by mouth 2 (two) times daily for 5 days., Disp: 40 capsule, Rfl: 0   ondansetron (ZOFRAN) 4 MG tablet, Take 1 tablet (4 mg total) by mouth daily as needed for up to 7 days for nausea or vomiting., Disp: 7 tablet, Rfl: 0   acetaminophen (TYLENOL) 500 MG tablet, Take 2 tablets (1,000 mg total) by mouth every 8 (eight) hours., Disp: 30 tablet, Rfl: 0   atorvastatin (LIPITOR) 80 MG tablet, Take 1 tablet by mouth once daily, Disp: 90 tablet, Rfl: 0   citalopram (CELEXA) 10 MG tablet, Take 1 tablet by mouth once daily, Disp: 90 tablet, Rfl: 0   clopidogrel (PLAVIX) 75 MG tablet, Take 1 tablet by mouth once daily, Disp: 90 tablet, Rfl: 0   Continuous Blood Gluc Sensor (FREESTYLE LIBRE 3 SENSOR) MISC, Place 1 sensor on the skin every 14 days. Use to check glucose continuously, Disp: 2 each, Rfl: 5   docusate sodium (COLACE) 100 MG capsule, Take 1 capsule (100 mg total) by mouth 2 (two) times daily., Disp: 10 capsule, Rfl: 0   levothyroxine (SYNTHROID) 125 MCG tablet, Take 1 tablet by mouth once daily, Disp: 30 tablet, Rfl: 2   metFORMIN (GLUCOPHAGE-XR) 500 MG 24 hr tablet, Take 2 tablets by mouth twice daily, Disp: 120 tablet, Rfl: 0   metoprolol succinate (TOPROL-XL) 25 MG 24 hr tablet, Take 1 tablet by mouth once daily, Disp: 90 tablet, Rfl: 0   pantoprazole (PROTONIX) 40 MG tablet, TAKE 1 TABLET BY MOUTH TWICE DAILY WITH A MEAL, Disp: 180 tablet, Rfl: 0   venlafaxine XR (EFFEXOR-XR) 150 MG 24 hr capsule, Take 1 capsule by mouth once daily, Disp: 90 capsule, Rfl: 0  Observations/Objective: Patient is well-developed, well-nourished in no acute distress.  Resting comfortably at home.  Head is normocephalic, atraumatic.  No labored breathing.  Speech is clear  and coherent with logical content.  Patient is alert and oriented at baseline.    Assessment and Plan: 1. COVID - guaiFENesin 200 MG tablet; Take 1 tablet (200 mg total) by mouth every 4 (four) hours as needed for up to 7 days for cough or to loosen phlegm.  Dispense: 30 tablet; Refill: 0 - molnupiravir EUA (LAGEVRIO) 200 mg CAPS capsule; Take 4 capsules (800 mg total) by mouth 2 (two) times daily for 5 days.  Dispense: 40 capsule; Refill: 0  2. Nausea - ondansetron (ZOFRAN) 4 MG tablet; Take 1 tablet (4 mg total) by mouth daily as needed for up to 7 days for nausea or vomiting.  Dispense: 7 tablet; Refill: 0  -Start Molnupiravir -Advised both patient and grandson that she will need to go to the emergency room if she does not improve or if O2 Sats keep dropping -Patient and grandson verbalized understanding  Follow Up Instructions: I discussed the assessment and treatment  plan with the patient. The patient was provided an opportunity to ask questions and all were answered. The patient agreed with the plan and demonstrated an understanding of the instructions.  A copy of instructions were sent to the patient via MyChart unless otherwise noted below.     The patient was advised to call back or seek an in-person evaluation if the symptoms worsen or if the condition fails to improve as anticipated.    Joyce Pandy, PA-C

## 2022-10-29 ENCOUNTER — Ambulatory Visit: Payer: Medicare HMO | Admitting: Internal Medicine

## 2022-11-01 ENCOUNTER — Other Ambulatory Visit: Payer: Self-pay | Admitting: Internal Medicine

## 2022-11-13 ENCOUNTER — Other Ambulatory Visit: Payer: Self-pay | Admitting: Internal Medicine

## 2022-11-20 ENCOUNTER — Telehealth: Payer: Self-pay

## 2022-11-20 DIAGNOSIS — N1831 Chronic kidney disease, stage 3a: Secondary | ICD-10-CM

## 2022-11-20 DIAGNOSIS — E1122 Type 2 diabetes mellitus with diabetic chronic kidney disease: Secondary | ICD-10-CM

## 2022-11-20 DIAGNOSIS — R531 Weakness: Secondary | ICD-10-CM

## 2022-11-20 NOTE — Telephone Encounter (Signed)
Patient's daughter, Blenda Mounts, called to state patient is not back with strength after having covid in October. Patient's daughter states she cannot get patient to office because she cannot leave her dad.  Misty Stanley states patient is probably not going to be strong enough in the next month to be able to come into the office.  We cancelled upcoming appointment with Dr. Dale Vincennes.    Misty Stanley states patient complains of being weak.  Misty Stanley states she only gets up to go to the bathroom.  Misty Stanley states patient was diagnosed with covid on 10/23/2022.  Misty Stanley states she is not with patient at this time, so I cannot transfer call to Access Nurse.  Misty Stanley states she would like for Rita Ohara, LPN, to please call her.  Misty Stanley states she would normally like to call home health in, but she is not sure if patient will allow it.  Misty Stanley states she will be glad to do a virtual visit, but she isn't sure how we will get the blood work done.

## 2022-11-20 NOTE — Telephone Encounter (Signed)
Given persistent issues with inability to get her in the office, etc and given persistent concerns with weakness, see if agreeable to an in home care (like Doctor's making house calls) or social work evaluation, etc.  Can see what they are able/agreeable to do.

## 2022-11-20 NOTE — Telephone Encounter (Addendum)
Patient daughter agrees to talk to Child psychotherapist to see if Dr.s Making house calls will work for them.  I have pended the referral for your approval. I also added RN Case management. Daughter advised that patient ha snot been out of bed since October except for a minimal time and that she is also having confusion that stating there is no caregiver In home, when the daughter knows the caregiver is there.  I explained this more reason patient needs in home medical care so that there can be regular visits and patient does not have to leave home. I ask daughter if this was new confusion she stated no, that she has been like this off an on for several months. Patient son has given the daughter of patient durable and healthcare POA. So , the daughter says she can make these decisions if needed but would like to talk with nurse and social worker first.

## 2022-11-20 NOTE — Telephone Encounter (Signed)
Ok to recommend doctors making house calls since this has been a reoccurring issue?

## 2022-11-21 NOTE — Telephone Encounter (Signed)
Order placed for referral.  

## 2022-11-23 ENCOUNTER — Telehealth: Payer: Self-pay | Admitting: *Deleted

## 2022-11-23 NOTE — Progress Notes (Signed)
  Care Coordination   Note   11/23/2022 Name: Joyce Maynard MRN: [id number] DOB: 28-Jan-1940  [name] is a 82 y.o. year old female who sees Dale Stanwood, MD for primary care. I reached out to [employer] by phone today to offer care coordination services.  [name] was given information about Care Coordination services today including:   The Care Coordination services include support from the care team which includes your Nurse Coordinator, Clinical Social Worker, or Pharmacist.  The Care Coordination team is here to help remove barriers to the health concerns and goals most important to you. Care Coordination services are voluntary, and the patient may decline or stop services at any time by request to their care team member.   Care Coordination Consent Status: Patient agreed to services and verbal consent obtained.   Follow up plan:  Telephone appointment with care coordination team member scheduled for:  11/26/2022  Encounter Outcome:  Patient Scheduled from referral   Burman Nieves, [facility] Care Coordination Care Guide Direct Dial: [phone]

## 2022-11-26 ENCOUNTER — Ambulatory Visit: Payer: Self-pay | Admitting: *Deleted

## 2022-11-26 NOTE — Patient Instructions (Signed)
Visit Information  Thank you for taking time to visit with me today. Please don't hesitate to contact me if I can be of assistance to you.   Following are the goals we discussed today:  Please review information provided regarding in home medical care Please discuss options regarding ongoing medical care in home vs. outpatient   Our next appointment is by telephone on 12/10/22 at 2pm  Please call the care guide team at [phone] if you need to cancel or reschedule your appointment.   If you are experiencing a Mental Health or Behavioral Health Crisis or need someone to talk to, please call 911   Patient verbalizes understanding of instructions and care plan provided today and agrees to view in MyChart. Active MyChart status and patient understanding of how to access instructions and care plan via MyChart confirmed with patient.     Telephone follow up appointment with care management team member scheduled for: 12/10/22  Verna Czech, LCSW [place]  [facility], [facility] Licensed Clinical Social Worker Care Coordinator  Direct Dial: [phone]

## 2022-11-26 NOTE — Patient Outreach (Signed)
  Care Coordination   Initial Visit Note   11/26/2022 Name: Joyce Maynard MRN: [id number] DOB: [birth year]  Joyce Maynard is a 82 y.o. year old female who sees Joyce [place], MD for primary care. I spoke with  Joyce Maynard by phone today.  What matters to the patients health and wellness today?  Patient's daughter requesting information regarding in home medical care. Per patient's daughter discussed difficulty transporting patient to medical appointments due to  weakness and confusion. Patient receives Meals on Wheels and has a daily caregiver.  Information regarding [facility] provided, daughter agrees to discuss this option with patient before final decision is made.    Goals Addressed             This Visit's Progress    Publishing rights manager needs-in home medical care       Interventions Today    Flowsheet Row Most Recent Value  Chronic Disease   Chronic disease during today's visit Hypertension (HTN), Other  [stroke, history of depression]  General Interventions   General Interventions Discussed/Reviewed General Interventions Discussed, Community Resources, Level of Care  [Needs assessment completed, confusion, weakness, difficulty transfering to car and getting to medical appointments-discussed options for in home medical care through [facility]]  Level of Care Personal Care Services  [confirms that patient has a daily caregiver]  Education Interventions   Education Provided Provided Education  Provided Verbal Education On Other  [[facility] due to homebound status-daughter to discuss options with patient for choice]  Mental Health Interventions   Mental Health Discussed/Reviewed Mental Health Discussed, Depression  [PCP managing mood stabilizers, caregiver stress acknowldeged, prioritizing self care emphasized, confirms having a postive support network]  Nutrition Interventions   Nutrition Discussed/Reviewed Nutrition Discussed  [receives meals on wheels]  Safety  Interventions   Safety Discussed/Reviewed Safety Discussed  [has a ring camera to monitor patient in the home]              SDOH assessments and interventions completed:  Yes  SDOH Interventions Today    Flowsheet Row Most Recent Value  SDOH Interventions   Food Insecurity Interventions Intervention Not Indicated  Housing Interventions Intervention Not Indicated  Transportation Interventions Intervention Not Indicated  Utilities Interventions Intervention Not Indicated        Care Coordination Interventions:  Yes, provided   Follow up plan: Follow up call scheduled for 12/10/22    Encounter Outcome:  Patient Visit Completed

## 2022-11-27 ENCOUNTER — Ambulatory Visit: Payer: Medicare HMO | Admitting: Internal Medicine

## 2022-11-30 ENCOUNTER — Other Ambulatory Visit: Payer: Self-pay | Admitting: Internal Medicine

## 2022-12-10 ENCOUNTER — Ambulatory Visit: Payer: Self-pay | Admitting: *Deleted

## 2022-12-10 NOTE — Patient Outreach (Addendum)
  Care Coordination   Follow Up Visit Note   12/11/2022 Name: Joyce Maynard MRN: [id number] DOB: [birth year]  Joyce Maynard is a 82 y.o. year old female who sees Dale [place], MD for primary care. I spoke with  Joyce Maynard's daughter Joyce Maynard  by phone  on 12/10/22.  What matters to the patients health and wellness today?  Patient's daughter has discussed referral to [facility] for in home medical care due to patient's homebound status. Patient's daughter has discussed this option with patient who would like to make final determination following outcome of follow up appointment on 02/11/23.      Goals Addressed             This Visit's Progress    Community resource needs-in home medical care       Interventions Today    Flowsheet Row Most Recent Value  Chronic Disease   Chronic disease during today's visit Hypertension (HTN), Other  [stroke, history of depression]  General Interventions   General Interventions Discussed/Reviewed General Interventions Reviewed, [employer]  [continued  weakness, difficulty with transfers to car and keeping appts with provider, Family discussed [facility] referral with patient for in home medical care. Pt to continue to consider [facility] as an option due to homebound status]              SDOH assessments and interventions completed:  No     Care Coordination Interventions:  Yes, provided   Follow up plan: No further intervention required.   Encounter Outcome:  Patient Visit Completed

## 2022-12-10 NOTE — Patient Instructions (Addendum)
    Visit Information  Thank you for taking time to visit with me today. Please don't hesitate to contact me if I can be of assistance to you.   Following are the goals we discussed today:  Patient/Patient's family will continue to consider [facility] referral due to homebound status and challenges with presenting to provider's office for face to face appointments   If you are experiencing a Mental Health or Behavioral Health Crisis or need someone to talk to, please call 911   Patient verbalizes understanding of instructions and care plan provided today and agrees to view in MyChart. Active MyChart status and patient understanding of how to access instructions and care plan via MyChart confirmed with patient.     No further follow up required: patient's daughter to contact this Child psychotherapist if referral to [facility] is needed  [employer] [place]  Value[facility]Based Care Institute, [facility] Licensed Clinical Social Geologist, engineering Dial: [phone]

## 2023-01-04 ENCOUNTER — Other Ambulatory Visit: Payer: Self-pay | Admitting: Internal Medicine

## 2023-02-11 ENCOUNTER — Other Ambulatory Visit: Payer: Self-pay | Admitting: Internal Medicine

## 2023-02-11 ENCOUNTER — Ambulatory Visit: Payer: Medicare HMO | Admitting: Internal Medicine

## 2023-02-11 ENCOUNTER — Telehealth: Payer: Self-pay | Admitting: Internal Medicine

## 2023-02-11 NOTE — Assessment & Plan Note (Deleted)
 On crestor.  Low cholesterol diet and exercise.  Follow lipid panel and liver function tests.

## 2023-02-11 NOTE — Assessment & Plan Note (Deleted)
 Physical today .  Sched2/6/25. Overdue mammogram.  Declines colonoscopy.

## 2023-02-11 NOTE — Progress Notes (Deleted)
 Subjective:    [name], female    DOB: 06/05/1940, 83 y.o.   MRN: [id number]  Patient here for No chief complaint on file.   HPI Here for a physical exam. Have not seen her since video visit 09/2021. Have discussed need for regular appointments. Hospitalized 09/19/21 - 09/21/21 - acute CVA. Presented with slurred speech and weakness. CT head and CTA - unremarkable. Neurology consulted - MRI - small acute infarct left caudate nucleus. Placed on aspirin  and plavix . TTE unremarkable. Declined SNF and was discharged home with PT/OT.    Past Medical History:  Diagnosis Date   Breast cancer (HCC) 2005   s/p chemotherapy and XRT   Depression    Diabetes (HCC)    GERD (gastroesophageal reflux disease)    Hx of vertigo    Hypercholesterolemia    Hypertension    Hypothyroidism    Polio 1948   history   PONV (postoperative nausea and vomiting)    Past Surgical History:  Procedure Laterality Date   BREAST BIOPSY Left 2005   BREAST BIOPSY Left 10/25/2015   us  guided biopsy   BREAST LUMPECTOMY Left 2005   EXCISION OF BREAST LESION Left 11/19/2015   Procedure: EXCISION OF BREAST LESION/ MASS;  Surgeon: Larinda Unknown Sharps, MD;  Location: [facility] ORS;  Service: General;  Laterality: Left;   EYE SURGERY     cataracts bilateral   TUBAL LIGATION     Family History  Problem Relation Age of Onset   Cancer Mother        breast   Breast cancer Mother 2   Stroke Father    Breast cancer Maternal Aunt    Social History   Socioeconomic History   Marital status: Married    Spouse name: Not on file   Number of children: Not on file   Years of education: Not on file   Highest education level: Not on file  Occupational History   Not on file  Tobacco Use   Smoking status: Never   Smokeless tobacco: Never  Substance and Sexual Activity   Alcohol use: No    Alcohol/week: 0.0 standard drinks of alcohol   Drug use: No   Sexual activity: Not Currently  Other Topics Concern    Not on file  Social History Narrative   Not on file   Social Drivers of Health   Financial Resource Strain: Low Risk  (08/17/2019)   Overall Financial Resource Strain (CARDIA)    Difficulty of Paying Living Expenses: Not very hard  Food Insecurity: No Food Insecurity (11/26/2022)   Hunger Vital Sign    Worried About Running Out of Food in the Last Year: Never true    Ran Out of Food in the Last Year: Never true  Transportation Needs: No Transportation Needs (11/26/2022)   PRAPARE - Administrator, Civil Service (Medical): No    Lack of Transportation (Non-Medical): No  Physical Activity: Not on file  Stress: No Stress Concern Present (08/17/2019)   [employer] of Occupational Health - Occupational Stress Questionnaire    Feeling of Stress : Not at all  Social Connections: Unknown (08/17/2019)   Social Connection and Isolation Panel [NHANES]    Frequency of Communication with Friends and Family: Not on file    Frequency of Social Gatherings with Friends and Family: Not on file    Attends Religious Services: Not on file    Active Member of Clubs or Organizations: Not on file  Attends Banker Meetings: Not on file    Marital Status: Married     Review of Systems     Objective:     There were no vitals taken for this visit. Wt Readings from Last 3 Encounters:  09/20/21 171 lb 8.3 oz (77.8 kg)  07/19/21 169 lb (76.7 kg)  08/19/20 200 lb (90.7 kg)    Physical Exam  {Perform Simple Foot Exam  Perform Detailed exam:1} {Insert foot Exam (Optional):30965}   Outpatient Encounter Medications as of 02/11/2023  Medication Sig   atorvastatin  (LIPITOR) 80 MG tablet Take 1 tablet by mouth once daily   citalopram  (CELEXA ) 10 MG tablet Take 1 tablet by mouth once daily   clopidogrel  (PLAVIX ) 75 MG tablet Take 1 tablet by mouth once daily   docusate sodium  (COLACE) 100 MG capsule Take 1 capsule (100 mg total) by mouth 2 (two) times daily.    levothyroxine  (SYNTHROID ) 125 MCG tablet Take 1 tablet by mouth once daily   metFORMIN  (GLUCOPHAGE -XR) 500 MG 24 hr tablet Take 2 tablets by mouth twice daily   metoprolol  succinate (TOPROL -XL) 25 MG 24 hr tablet Take 1 tablet by mouth once daily   pantoprazole  (PROTONIX ) 40 MG tablet TAKE 1 TABLET BY MOUTH TWICE DAILY WITH A MEAL   venlafaxine  XR (EFFEXOR -XR) 150 MG 24 hr capsule Take 1 capsule by mouth once daily   No facility-administered encounter medications on file as of 02/11/2023.     Lab Results  Component Value Date   WBC 6.3 09/19/2021   HGB 13.7 09/19/2021   HCT 41.0 09/19/2021   PLT 211 09/19/2021   GLUCOSE 152 (H) 09/19/2021   CHOL 214 (H) 09/20/2021   TRIG 93 09/20/2021   HDL 50 09/20/2021   LDLDIRECT 109.9 11/01/2012   LDLCALC 145 (H) 09/20/2021   ALT 15 09/19/2021   AST 18 09/19/2021   NA 142 09/19/2021   K 4.0 09/19/2021   CL 111 09/19/2021   CREATININE 1.15 (H) 09/19/2021   BUN 22 09/19/2021   CO2 23 09/19/2021   TSH 0.22 (L) 02/15/2020   INR 1.0 09/19/2021   HGBA1C 6.4 (H) 07/20/2021   MICROALBUR 3.7 (H) 02/15/2020    ECHOCARDIOGRAM COMPLETE Result Date: 09/21/2021    ECHOCARDIOGRAM REPORT   Patient Name:   Joyce Maynard Date of Exam: 09/21/2021 Medical Rec #:  [id number]    Height:       66.0 in Accession #:    [id number]   Weight:       171.5 lb Date of Birth:  06-Feb-1940    BSA:          1.873 m Patient Age:    81 years     BP:           171/97 mmHg Patient Gender: F            HR:           76 bpm. Exam Location:  Inpatient Procedure: 2D Echo, Cardiac Doppler and Color Doppler Indications:     Stroke I63.9  History:         Patient has prior history of Echocardiogram examinations, most                  recent 10/19/2019. Risk Factors:Hypertension, Diabetes and                  Dyslipidemia. Past history of breast cancer.  Sonographer:     Annabella Fell RDCS Referring Phys:  HAZEL V DUNCAN Diagnosing Phys: Wilbert Bihari MD IMPRESSIONS  1. Left ventricular  ejection fraction, by estimation, is 60 to 65%. The left ventricle has normal function. The left ventricle has no regional wall motion abnormalities. There is mild left ventricular hypertrophy of the basal-septal segment. Left ventricular diastolic parameters are consistent with Grade I diastolic dysfunction (impaired relaxation). Elevated left ventricular end-diastolic pressure.  2. Right ventricular systolic function is normal. The right ventricular size is normal. Tricuspid regurgitation signal is inadequate for assessing PA pressure.  3. The mitral valve is normal in structure. Trivial mitral valve regurgitation. No evidence of mitral stenosis.  4. The aortic valve is tricuspid. Aortic valve regurgitation is trivial. Aortic valve sclerosis/calcification is present, without any evidence of aortic stenosis.  5. The inferior vena cava is normal in size with greater than 50% respiratory variability, suggesting right atrial pressure of 3 mmHg. FINDINGS  Left Ventricle: Left ventricular ejection fraction, by estimation, is 60 to 65%. The left ventricle has normal function. The left ventricle has no regional wall motion abnormalities. The left ventricular internal cavity size was normal in size. There is  mild left ventricular hypertrophy of the basal-septal segment. Left ventricular diastolic parameters are consistent with Grade I diastolic dysfunction (impaired relaxation). Elevated left ventricular end-diastolic pressure. Right Ventricle: The right ventricular size is normal. No increase in right ventricular wall thickness. Right ventricular systolic function is normal. Tricuspid regurgitation signal is inadequate for assessing PA pressure. Left Atrium: Left atrial size was normal in size. Right Atrium: Right atrial size was normal in size. Pericardium: There is no evidence of pericardial effusion. Mitral Valve: The mitral valve is normal in structure. Mild mitral annular calcification. Trivial mitral valve  regurgitation. No evidence of mitral valve stenosis. Tricuspid Valve: The tricuspid valve is normal in structure. Tricuspid valve regurgitation is trivial. No evidence of tricuspid stenosis. Aortic Valve: The aortic valve is tricuspid. Aortic valve regurgitation is trivial. Aortic valve sclerosis/calcification is present, without any evidence of aortic stenosis. Pulmonic Valve: The pulmonic valve was normal in structure. Pulmonic valve regurgitation is trivial. No evidence of pulmonic stenosis. Aorta: The aortic root is normal in size and structure. Venous: The inferior vena cava is normal in size with greater than 50% respiratory variability, suggesting right atrial pressure of 3 mmHg. IAS/Shunts: No atrial level shunt detected by color flow Doppler.  LEFT VENTRICLE PLAX 2D LVIDd:         3.60 cm   Diastology LVIDs:         2.60 cm   LV e' medial:    3.81 cm/s LV PW:         1.00 cm   LV E/e' medial:  15.8 LV IVS:        1.20 cm   LV e' lateral:   5.06 cm/s LVOT diam:     2.10 cm   LV E/e' lateral: 11.9 LV SV:         54 LV SV Index:   29 LVOT Area:     3.46 cm  RIGHT VENTRICLE RV S prime:     7.49 cm/s TAPSE (M-mode): 1.5 cm LEFT ATRIUM             Index        RIGHT ATRIUM          Index LA diam:        3.20 cm 1.71 cm/m   RA Area:     7.17 cm LA Vol (A2C):   25.9  ml 13.83 ml/m  RA Volume:   10.10 ml 5.39 ml/m LA Vol (A4C):   32.0 ml 17.08 ml/m LA Biplane Vol: 30.2 ml 16.12 ml/m  AORTIC VALVE LVOT Vmax:   72.70 cm/s LVOT Vmean:  51.000 cm/s LVOT VTI:    0.157 m  AORTA Ao Root diam: 3.30 cm Ao Asc diam:  3.20 cm MITRAL VALVE MV Area (PHT): 2.32 cm    SHUNTS MV Decel Time: 327 msec    Systemic VTI:  0.16 m MV E velocity: 60.30 cm/s  Systemic Diam: 2.10 cm MV A velocity: 92.80 cm/s MV E/A ratio:  0.65 Wilbert Bihari MD Electronically signed by Wilbert Bihari MD Signature Date/Time: 09/21/2021/1:34:54 PM    Final        Assessment & Plan:  There are no diagnoses linked to this encounter.   Allena Hamilton,  MD

## 2023-02-11 NOTE — Telephone Encounter (Signed)
 Noted. Agree with recommendation for evaluation today if she fell and hit her head. Noted - will no longer be pt here and will get care through Drs making house calls.  Will need to remove me as pcp - if transferring care.

## 2023-02-11 NOTE — Telephone Encounter (Signed)
 Copied from CRM [id number]. Topic: General - Other >> Feb 11, 2023  9:15 AM Robinson H wrote: Reason for CRM: Patients daughter called to cancel appointment for today, patients daughter states patient fell can't come in, patients daughter denied being triaged. Daughter also states patient is going to go with an at home doctor, patient no able to come into office.

## 2023-02-11 NOTE — Telephone Encounter (Signed)
 FYI Called and checked on patient. She fell, hit her head this morning while getting ready. Daughter reported small head laceration from the shower door. Bleeding has stopped. No acute symptoms reported. Advised ED evaluation since she fell and hit her head, daughter declined and said she did not hit that hard so she is fine. Number given to her for Doctors making Housecalls. She will no longer be our patient.

## 2023-02-12 NOTE — Telephone Encounter (Signed)
 Patients daughter says that Joyce Maynard was going to help her getting set up with [facility] as a new PCP. Is this something you can help with ?

## 2023-02-15 ENCOUNTER — Ambulatory Visit: Payer: Self-pay | Admitting: *Deleted

## 2023-02-15 NOTE — Patient Outreach (Signed)
  Care Coordination   Follow Up Visit Note   02/15/2023 Name: Joyce Maynard MRN: [id number] DOB: 01-Mar-1940  Joyce Maynard is a 83 y.o. year old female who sees Dellar Fenton, MD for primary care. I spoke with  Joyce Maynard 's daughter Alease Amend by phone today.  What matters to the patients health and wellness today?  Patient now homebound, daughter interested in in home medical care. Referral sent to [facility].    Goals Addressed             This Visit's Progress    Community resource needs-in home medical care        Activities and task to complete in order to accomplish goals.   REFERRAL PLACED BY SOCIAL WORK  I have placed a referral with [facility] they will contact you.  The coordinator will contact you to schedule the initial in home assessment appointment.         SDOH assessments and interventions completed:  No     Care Coordination Interventions:  Yes, provided  Interventions Today    Flowsheet Row Most Recent Value  Chronic Disease   Chronic disease during today's visit Hypertension (HTN)  [Acute CVA, stroke]  General Interventions   General Interventions Discussed/Reviewed General Interventions Discussed, Doctor Visits, Communication with  [daughter requesting assistance with referral for in home medical care after fall-patient unable to physically get to appts to see PCP]  Doctor Visits Discussed/Reviewed Doctor Visits Discussed  [multiple missed appointments due to physical limitations discussed-pt agreeable to in home medical care due to home bound status]  Communication with --  [[facility]-confirmed that they are in network with patient's insurance-clinical information to be faxed to [phone]  Education Interventions   Education Provided Provided Education  Provided Verbal Education On Other  [homebased medical care]       Follow up plan: Follow up call scheduled for 03/01/23    Encounter Outcome:  Patient Visit Completed

## 2023-02-15 NOTE — Patient Instructions (Signed)
 Visit Information  Thank you for taking time to visit with me today. Please don't hesitate to contact me if I can be of assistance to you.   Following are the goals we discussed today:   Goals Addressed             This Visit's Progress    Community resource needs-in home medical care        Activities and task to complete in order to accomplish goals.   REFERRAL PLACED BY SOCIAL WORK  I have placed a referral with [facility] they will contact you.  The coordinator will contact you to schedule the initial in home assessment appointment.         Our next appointment is by telephone on 03/01/23 at 10am  Please call the care guide team at [phone] if you need to cancel or reschedule your appointment.   If you are experiencing a Mental Health or Behavioral Health Crisis or need someone to talk to, please call 911   Patient verbalizes understanding of instructions and care plan provided today and agrees to view in MyChart. Active MyChart status and patient understanding of how to access instructions and care plan via MyChart confirmed with patient.     Telephone follow up appointment with care management team member scheduled for: 03/01/23   Michaelle Adolphus, LCSW [place]  [facility] Institute, [facility] Licensed Clinical Social Worker Care Coordinator  Direct Dial: [phone]

## 2023-02-15 NOTE — Telephone Encounter (Signed)
 Noted.

## 2023-03-01 ENCOUNTER — Ambulatory Visit: Payer: Self-pay | Admitting: *Deleted

## 2023-03-01 NOTE — Patient Outreach (Signed)
 Care Coordination   Follow Up Visit Note   03/01/2023 Name: Joyce Maynard MRN: [id number] DOB: 04-01-1940  Joyce Maynard is a 83 y.o. year old female who sees Dale [place], MD for primary care. I spoke with  Joyce Maynard's daughter Joyce Maynard by phone today.  What matters to the patients health and wellness today?  In home medical care. Patient requesting assistance with in home medical care due to fall and mobility limitations.   Goals Addressed             This Visit's Progress    Community resource needs-in home medical care        Activities and task to complete in order to accomplish goals.   REFERRAL PLACED BY SOCIAL WORK  I have placed a referral with [facility] they will contact you.  Referral remains in review. The coordinator will contact you to schedule the initial in home assessment appointment.         SDOH assessments and interventions completed:  No     Care Coordination Interventions:  Yes, provided  Interventions Today    Flowsheet Row Most Recent Value  Chronic Disease   Chronic disease during today's visit Hypertension (HTN)  [Acute CVA, stroke]  General Interventions   General Interventions Discussed/Reviewed Communication with, General Interventions Reviewed  [follow up with daughter regarding referral for in home medical care with [facility] following a fall-pt remains unable to present to PCP in person due to her medical condition]  Communication with --  [[facility] confirms that they have received the referral-approval from admissions coordinator pending review-pt's daughter to be informed]  Education Interventions   Education Provided Provided Education  Provided Verbal Education On [employer] based medical care]       Follow up plan: Follow up call scheduled for 03/15/23    Encounter Outcome:  Patient Visit Completed

## 2023-03-01 NOTE — Patient Instructions (Signed)
 Visit Information  Thank you for taking time to visit with me today. Please don't hesitate to contact me if I can be of assistance to you.   Following are the goals we discussed today:   Goals Addressed             This Visit's Progress    Community resource needs-in home medical care        Activities and task to complete in order to accomplish goals.   REFERRAL PLACED BY SOCIAL WORK  I have placed a referral with [facility] they will contact you.  Referral remains in review. The coordinator will contact you to schedule the initial in home assessment appointment.         Our next appointment is by telephone on 03/15/23 at 10am  Please call the care guide team at [phone] if you need to cancel or reschedule your appointment.   If you are experiencing a Mental Health or Behavioral Health Crisis or need someone to talk to, please call 911   Patient verbalizes understanding of instructions and care plan provided today and agrees to view in MyChart. Active MyChart status and patient understanding of how to access instructions and care plan via MyChart confirmed with patient.     Telephone follow up appointment with care management team member scheduled for: 03/15/23    Verna Czech, LCSW [place]  [facility] Institute, [facility] Licensed Clinical Social Worker Care Coordinator  Direct Dial: [phone]

## 2023-03-15 ENCOUNTER — Other Ambulatory Visit: Payer: Self-pay | Admitting: Internal Medicine

## 2023-03-15 ENCOUNTER — Ambulatory Visit: Payer: Self-pay | Admitting: *Deleted

## 2023-03-15 NOTE — Telephone Encounter (Signed)
 Notify them that Joyce Maynard is following up on the referral.  If she is out of medication, ok to refill x 1 (30 days) with no refill - until can get established.

## 2023-03-15 NOTE — Patient Outreach (Signed)
 Care Coordination   Follow Up Visit Note   03/15/2023 Name: NORELL BRISBIN MRN: [id number] DOB: 03/25/1940  Aleeta E Panameno is a 83 y.o. year old female who sees Dale [place], MD for primary care. I spoke with  Emanie E Pusey's daughter by phone today.  What matters to the patients health and wellness today?  Referral for home based medical care.    Goals Addressed             This Visit's Progress    Community resource needs-in home medical care        Activities and task to complete in order to accomplish goals.   REFERRAL PLACED BY SOCIAL WORK  I have placed a referral with [facility] they will contact you.  Referral continues to be in review. The coordinator will contact you to schedule the initial in home assessment appointment. CSW to continue to follow up on status of [facility] referral         SDOH assessments and interventions completed:  No     Care Coordination Interventions:  Yes, provided  Interventions Today    Flowsheet Row Most Recent Value  Chronic Disease   Chronic disease during today's visit Hypertension (HTN)  [Acute CVA, Stroke]  General Interventions   General Interventions Discussed/Reviewed General Interventions Reviewed, Community Resources  [follow up regarding home based medical care program. Patient's daughter continues to request home based medical care through [facility]-referral completed, initial assessment remains pending.]  Education Interventions   Education Provided Provided Education  Provided Verbal Education On [employer] Based medical care options]       Follow up plan: Follow up call scheduled for 03/29/23    Encounter Outcome:  Patient Visit Completed

## 2023-03-15 NOTE — Patient Instructions (Signed)
 Visit Information  Thank you for taking time to visit with me today. Please don't hesitate to contact me if I can be of assistance to you.   Following are the goals we discussed today:   Goals Addressed             This Visit's Progress    Community resource needs-in home medical care        Activities and task to complete in order to accomplish goals.   REFERRAL PLACED BY SOCIAL WORK  I have placed a referral with [facility] they will contact you.  Referral continues to be in review. The coordinator will contact you to schedule the initial in home assessment appointment. CSW to continue to follow up on status of [facility] referral         Our next appointment is by telephone on 03/29/23 at 10am  Please call the care guide team at [phone] if you need to cancel or reschedule your appointment.   If you are experiencing a Mental Health or Behavioral Health Crisis or need someone to talk to, please call the Suicide and Crisis Lifeline: 988   Patient verbalizes understanding of instructions and care plan provided today and agrees to view in MyChart. Active MyChart status and patient understanding of how to access instructions and care plan via MyChart confirmed with patient.     Telephone follow up appointment with care management team member scheduled for: 03/29/23  Verna Czech, LCSW [place]  [facility] Institute, [facility] Licensed Clinical Social Worker Care Coordinator  Direct Dial: [phone]

## 2023-03-15 NOTE — Telephone Encounter (Signed)
 Reviewed note and reviewed chart. Please contact [employer] (social worker who is working with her) to get more specifics. Need to know where they are in the process of transitioning to [employer]. I will not be able to continue to prescribe medication given has been 18 months since she has been seen - so need to know how much longer to get things arranged.

## 2023-03-16 MED ORDER — METOPROLOL SUCCINATE ER 25 MG PO TB24: 25.0000 mg | ORAL_TABLET | Freq: Every day | ORAL | 0 refills | Status: AC

## 2023-03-16 MED ORDER — LEVOTHYROXINE SODIUM 125 MCG PO TABS: 125.0000 ug | ORAL_TABLET | Freq: Every day | ORAL | 0 refills | Status: AC

## 2023-03-16 MED ORDER — METFORMIN HCL ER 500 MG PO TB24: 1000.0000 mg | ORAL_TABLET | Freq: Two times a day (BID) | ORAL | 0 refills | Status: AC

## 2023-03-16 MED ORDER — VENLAFAXINE HCL ER 150 MG PO CP24
150.0000 mg | ORAL_CAPSULE | Freq: Every day | ORAL | 0 refills | Status: AC
Start: 2023-03-16 — End: ?

## 2023-03-16 MED ORDER — PANTOPRAZOLE SODIUM 40 MG PO TBEC: 40.0000 mg | DELAYED_RELEASE_TABLET | Freq: Two times a day (BID) | ORAL | 0 refills | Status: AC

## 2023-03-16 MED ORDER — CITALOPRAM HYDROBROMIDE 10 MG PO TABS: 10.0000 mg | ORAL_TABLET | Freq: Every day | ORAL | 0 refills | Status: AC

## 2023-03-16 MED ORDER — METOPROLOL SUCCINATE ER 25 MG PO TB24: 25.0000 mg | ORAL_TABLET | Freq: Every day | ORAL | Status: DC

## 2023-03-16 NOTE — Telephone Encounter (Signed)
 I refilled lipitor and plavix #30 with no refills. I have not seen her in 18 months. Social work is calling to f/u regarding referral for Doctors making house calls.

## 2023-03-29 ENCOUNTER — Ambulatory Visit: Payer: Self-pay | Admitting: *Deleted

## 2023-03-29 NOTE — Patient Instructions (Signed)
 Visit Information  Thank you for taking time to visit with me today. Please don't hesitate to contact me if I can be of assistance to you.   Following are the goals we discussed today:   Goals Addressed             This Visit's Progress    COMPLETED: Community resource needs-in home medical care        Activities and task to complete in order to accomplish goals.    Referral with [facility] placed-face to face visit scheduled for 04/01/23 CSW to continue to follow up on status of [facility] referral  Keep all upcoming appointment discussed today Continue with compliance of taking medication prescribed by Doctor          Please call the care guide team at [phone] if you need to cancel or reschedule your appointment.   If you are experiencing a Mental Health or Behavioral Health Crisis or need someone to talk to, please call [phone]   Patient verbalizes understanding of instructions and care plan provided today and agrees to view in MyChart. Active MyChart status and patient understanding of how to access instructions and care plan via MyChart confirmed with patient.     No further follow up required: Patient/patient's daughter to contact this Child psychotherapist with any additional community resource needs  [employer], [employer] [place]  [facility], [facility] Licensed Clinical Social Geologist, engineering Dial: [phone]

## 2023-03-29 NOTE — Patient Outreach (Signed)
 Care Coordination   Follow Up Visit Note   03/29/2023 Name: Joyce Maynard MRN: [id number] DOB: July 10, 1940  Joyce Maynard is a 83 y.o. year old female who sees Dale [place], MD for primary care. I spoke with  Joyce Maynard's daughter by phone today.  What matters to the patients health and wellness today?  Daughter states that patient's condition continues to progress-mobility is decreasing. In home medical care requested and has been established with [facility]. Initial in home appointment scheduled for 04/01/23.    Goals Addressed             This Visit's Progress    COMPLETED: Community resource needs-in home medical care        Activities and task to complete in order to accomplish goals.    Referral with [facility] placed-face to face visit scheduled for 04/01/23 CSW to continue to follow up on status of [facility] referral  Keep all upcoming appointment discussed today Continue with compliance of taking medication prescribed by Doctor         SDOH assessments and interventions completed:  No     Care Coordination Interventions:  Yes, provided  Interventions Today    Flowsheet Row Most Recent Value  Chronic Disease   Chronic disease during today's visit Hypertension (HTN)  [Acute CVA, stroke]  General Interventions   General Interventions Discussed/Reviewed General Interventions Reviewed, [facility], Doctor Visits, Level of Care  [Now active with [facility], virtual appt completed. first in home scheduled for 04/01/23]  Doctor Visits Discussed/Reviewed Doctor Visits Reviewed  Level of Care Personal Care Services  [Confirms that patient has a "live in" aid for both patient and spouse]  Mental Health Interventions   Mental Health Discussed/Reviewed Coping Strategies, Mental Health Discussed, Depression  [caregiver stress acknowldeged, discussed relalities of patient's condition and current coping strategies used-emotional support provided, self care  reinforced]  Safety Interventions   Safety Discussed/Reviewed Safety Discussed  [had aid 24 hours in the home, also has cameras]  Advanced Directive Interventions   Advanced Directives Discussed/Reviewed Advanced Directives Discussed  [patient 's daughter confirms having Durable Power of Attorney]       Follow up plan: No further intervention required.   Encounter Outcome:  Patient Visit Completed

## 2023-04-19 ENCOUNTER — Telehealth: Payer: Self-pay

## 2023-04-19 NOTE — Telephone Encounter (Signed)
 Called to get pt scheduled for a follow up as pt was last seen via video visit on 10-02-21 and Dr. Geralyn Knee wanted pt to follow up in 2 month which never got scheduled and for A1c compliance. Spoke with Daughter Edwina Gram who is on DPR and she informed me that pt was transferred to [facility] she no longer sees Dr. Geralyn Knee.     I have removed Dr. Geralyn Knee as pts PCP

## 2023-05-06 ENCOUNTER — Other Ambulatory Visit: Payer: Self-pay | Admitting: Internal Medicine

## 2023-05-06 NOTE — Telephone Encounter (Signed)
 Please refuse. We are not her pcp anymore. Followed by [facility].

## 2023-06-20 ENCOUNTER — Other Ambulatory Visit: Payer: Self-pay | Admitting: Internal Medicine

## 2023-06-21 NOTE — Telephone Encounter (Signed)
 No longer a pt of Dr. Geralyn Knee. Is it okay to refuse refill request?

## 2023-09-14 ENCOUNTER — Other Ambulatory Visit: Payer: Self-pay | Admitting: Internal Medicine

## 2023-09-15 NOTE — Telephone Encounter (Signed)
 Per review of chart, pt is being followed by home care physicians. No longer my pt. Please notify pharmacy.
